# Patient Record
Sex: Female | Born: 1965
Health system: Southern US, Community
[De-identification: ages and names within clinical notes are randomized; demographics above are authoritative.]

## PROBLEM LIST (undated history)

## (undated) ENCOUNTER — Emergency Department (HOSPITAL_COMMUNITY): Admission: EM | Payer: Medicaid Other

## (undated) DIAGNOSIS — E119 Type 2 diabetes mellitus without complications: Secondary | ICD-10-CM

## (undated) DIAGNOSIS — M797 Fibromyalgia: Secondary | ICD-10-CM

## (undated) DIAGNOSIS — Z972 Presence of dental prosthetic device (complete) (partial): Secondary | ICD-10-CM

## (undated) DIAGNOSIS — R519 Headache, unspecified: Secondary | ICD-10-CM

## (undated) DIAGNOSIS — R200 Anesthesia of skin: Secondary | ICD-10-CM

## (undated) DIAGNOSIS — D649 Anemia, unspecified: Secondary | ICD-10-CM

## (undated) DIAGNOSIS — I509 Heart failure, unspecified: Secondary | ICD-10-CM

## (undated) DIAGNOSIS — K469 Unspecified abdominal hernia without obstruction or gangrene: Secondary | ICD-10-CM

## (undated) DIAGNOSIS — R51 Headache: Secondary | ICD-10-CM

## (undated) DIAGNOSIS — I10 Essential (primary) hypertension: Secondary | ICD-10-CM

## (undated) HISTORY — DX: Headache, unspecified: R51.9

## (undated) HISTORY — DX: Anemia, unspecified: D64.9

## (undated) HISTORY — DX: Essential (primary) hypertension: I10

## (undated) HISTORY — PX: TUBAL LIGATION: SHX77

## (undated) HISTORY — DX: Unspecified abdominal hernia without obstruction or gangrene: K46.9

## (undated) HISTORY — DX: Headache: R51

## (undated) HISTORY — PX: HERNIA REPAIR: SHX51

## (undated) HISTORY — DX: Anesthesia of skin: R20.0

---

## 1997-08-15 ENCOUNTER — Other Ambulatory Visit: Admission: RE | Admit: 1997-08-15 | Discharge: 1997-08-15 | Payer: Self-pay | Admitting: Family Medicine

## 1997-11-18 ENCOUNTER — Inpatient Hospital Stay (HOSPITAL_COMMUNITY): Admission: AD | Admit: 1997-11-18 | Discharge: 1997-11-18 | Payer: Self-pay | Admitting: Emergency Medicine

## 1997-11-20 ENCOUNTER — Inpatient Hospital Stay (HOSPITAL_COMMUNITY): Admission: AD | Admit: 1997-11-20 | Discharge: 1997-11-26 | Payer: Self-pay | Admitting: Obstetrics

## 1997-11-27 ENCOUNTER — Encounter: Admission: RE | Admit: 1997-11-27 | Discharge: 1998-02-25 | Payer: Self-pay | Admitting: Obstetrics

## 1998-01-05 ENCOUNTER — Inpatient Hospital Stay (HOSPITAL_COMMUNITY): Admission: AD | Admit: 1998-01-05 | Discharge: 1998-01-12 | Payer: Self-pay | Admitting: Obstetrics

## 1998-04-11 ENCOUNTER — Emergency Department (HOSPITAL_COMMUNITY): Admission: EM | Admit: 1998-04-11 | Discharge: 1998-04-11 | Payer: Self-pay | Admitting: Emergency Medicine

## 1998-04-12 ENCOUNTER — Encounter: Payer: Self-pay | Admitting: Family Medicine

## 1998-04-12 ENCOUNTER — Encounter: Payer: Self-pay | Admitting: *Deleted

## 1998-06-21 ENCOUNTER — Emergency Department (HOSPITAL_COMMUNITY): Admission: EM | Admit: 1998-06-21 | Discharge: 1998-06-21 | Payer: Self-pay | Admitting: Emergency Medicine

## 1998-08-12 ENCOUNTER — Emergency Department (HOSPITAL_COMMUNITY): Admission: EM | Admit: 1998-08-12 | Discharge: 1998-08-12 | Payer: Self-pay | Admitting: Emergency Medicine

## 2000-02-28 ENCOUNTER — Emergency Department (HOSPITAL_COMMUNITY): Admission: EM | Admit: 2000-02-28 | Discharge: 2000-02-28 | Payer: Self-pay | Admitting: Emergency Medicine

## 2000-11-04 ENCOUNTER — Inpatient Hospital Stay (HOSPITAL_COMMUNITY): Admission: EM | Admit: 2000-11-04 | Discharge: 2000-11-08 | Payer: Self-pay | Admitting: Emergency Medicine

## 2001-12-19 ENCOUNTER — Encounter: Payer: Self-pay | Admitting: Emergency Medicine

## 2001-12-19 ENCOUNTER — Emergency Department (HOSPITAL_COMMUNITY): Admission: EM | Admit: 2001-12-19 | Discharge: 2001-12-19 | Payer: Self-pay | Admitting: Emergency Medicine

## 2002-10-19 ENCOUNTER — Emergency Department (HOSPITAL_COMMUNITY): Admission: EM | Admit: 2002-10-19 | Discharge: 2002-10-19 | Payer: Self-pay | Admitting: Emergency Medicine

## 2002-10-19 ENCOUNTER — Encounter: Payer: Self-pay | Admitting: Emergency Medicine

## 2002-11-05 ENCOUNTER — Other Ambulatory Visit: Admission: RE | Admit: 2002-11-05 | Discharge: 2002-11-05 | Payer: Self-pay | Admitting: Anesthesiology

## 2002-11-05 ENCOUNTER — Other Ambulatory Visit: Admission: RE | Admit: 2002-11-05 | Discharge: 2002-11-05 | Payer: Self-pay | Admitting: Family Medicine

## 2003-06-17 ENCOUNTER — Emergency Department (HOSPITAL_COMMUNITY): Admission: EM | Admit: 2003-06-17 | Discharge: 2003-06-17 | Payer: Self-pay | Admitting: Emergency Medicine

## 2003-11-12 ENCOUNTER — Emergency Department (HOSPITAL_COMMUNITY): Admission: EM | Admit: 2003-11-12 | Discharge: 2003-11-12 | Payer: Self-pay | Admitting: *Deleted

## 2005-01-22 ENCOUNTER — Inpatient Hospital Stay (HOSPITAL_COMMUNITY): Admission: AD | Admit: 2005-01-22 | Discharge: 2005-01-22 | Payer: Self-pay | Admitting: Obstetrics & Gynecology

## 2005-05-08 ENCOUNTER — Emergency Department (HOSPITAL_COMMUNITY): Admission: EM | Admit: 2005-05-08 | Discharge: 2005-05-08 | Payer: Self-pay | Admitting: Emergency Medicine

## 2005-05-25 ENCOUNTER — Emergency Department (HOSPITAL_COMMUNITY): Admission: EM | Admit: 2005-05-25 | Discharge: 2005-05-26 | Payer: Self-pay | Admitting: Emergency Medicine

## 2005-07-10 ENCOUNTER — Emergency Department (HOSPITAL_COMMUNITY): Admission: EM | Admit: 2005-07-10 | Discharge: 2005-07-10 | Payer: Self-pay | Admitting: Emergency Medicine

## 2005-08-24 ENCOUNTER — Inpatient Hospital Stay (HOSPITAL_COMMUNITY): Admission: AD | Admit: 2005-08-24 | Discharge: 2005-08-24 | Payer: Self-pay | Admitting: Gynecology

## 2006-05-16 ENCOUNTER — Emergency Department (HOSPITAL_COMMUNITY): Admission: EM | Admit: 2006-05-16 | Discharge: 2006-05-16 | Payer: Self-pay | Admitting: Emergency Medicine

## 2006-10-29 ENCOUNTER — Emergency Department (HOSPITAL_COMMUNITY): Admission: EM | Admit: 2006-10-29 | Discharge: 2006-10-29 | Payer: Self-pay | Admitting: Emergency Medicine

## 2007-08-28 ENCOUNTER — Emergency Department (HOSPITAL_COMMUNITY): Admission: EM | Admit: 2007-08-28 | Discharge: 2007-08-28 | Payer: Self-pay | Admitting: Emergency Medicine

## 2007-09-13 ENCOUNTER — Ambulatory Visit: Payer: Self-pay | Admitting: Internal Medicine

## 2008-03-04 ENCOUNTER — Emergency Department (HOSPITAL_COMMUNITY): Admission: EM | Admit: 2008-03-04 | Discharge: 2008-03-04 | Payer: Self-pay | Admitting: Family Medicine

## 2008-05-04 ENCOUNTER — Inpatient Hospital Stay (HOSPITAL_COMMUNITY): Admission: AD | Admit: 2008-05-04 | Discharge: 2008-05-04 | Payer: Self-pay | Admitting: Family Medicine

## 2008-05-09 ENCOUNTER — Emergency Department (HOSPITAL_COMMUNITY): Admission: EM | Admit: 2008-05-09 | Discharge: 2008-05-09 | Payer: Self-pay | Admitting: Emergency Medicine

## 2008-09-07 ENCOUNTER — Emergency Department (HOSPITAL_COMMUNITY): Admission: EM | Admit: 2008-09-07 | Discharge: 2008-09-07 | Payer: Self-pay | Admitting: Emergency Medicine

## 2009-02-06 ENCOUNTER — Encounter (INDEPENDENT_AMBULATORY_CARE_PROVIDER_SITE_OTHER): Payer: Self-pay | Admitting: Adult Health

## 2009-02-06 ENCOUNTER — Ambulatory Visit: Payer: Self-pay | Admitting: Family Medicine

## 2009-02-06 LAB — CONVERTED CEMR LAB: Herpes Simplex Vrs I&II-IgM Ab (EIA): 0.66

## 2009-03-17 ENCOUNTER — Ambulatory Visit: Payer: Self-pay | Admitting: Internal Medicine

## 2009-03-20 ENCOUNTER — Ambulatory Visit: Payer: Self-pay | Admitting: Internal Medicine

## 2009-03-31 ENCOUNTER — Ambulatory Visit: Payer: Self-pay | Admitting: Family Medicine

## 2009-03-31 ENCOUNTER — Encounter (INDEPENDENT_AMBULATORY_CARE_PROVIDER_SITE_OTHER): Payer: Self-pay | Admitting: Adult Health

## 2009-03-31 ENCOUNTER — Other Ambulatory Visit: Admission: RE | Admit: 2009-03-31 | Discharge: 2009-03-31 | Payer: Self-pay | Admitting: Internal Medicine

## 2009-03-31 LAB — CONVERTED CEMR LAB
ALT: 28 units/L (ref 0–35)
Alkaline Phosphatase: 76 units/L (ref 39–117)
Calcium: 8.9 mg/dL (ref 8.4–10.5)
Creatinine, Ser: 0.62 mg/dL (ref 0.40–1.20)
Eosinophils Relative: 2 % (ref 0–5)
Hemoglobin: 12.8 g/dL (ref 12.0–15.0)
LDL Cholesterol: 147 mg/dL — ABNORMAL HIGH (ref 0–99)
Lymphocytes Relative: 37 % (ref 12–46)
MCHC: 31.1 g/dL (ref 30.0–36.0)
Potassium: 4.4 meq/L (ref 3.5–5.3)
Sodium: 142 meq/L (ref 135–145)
TSH: 1.576 microintl units/mL (ref 0.350–4.500)
Total Bilirubin: 0.4 mg/dL (ref 0.3–1.2)
Total CHOL/HDL Ratio: 6.8
VLDL: 46 mg/dL — ABNORMAL HIGH (ref 0–40)
Vit D, 25-Hydroxy: 20 ng/mL — ABNORMAL LOW (ref 30–89)
WBC: 5.3 10*3/uL (ref 4.0–10.5)

## 2009-04-08 ENCOUNTER — Ambulatory Visit (HOSPITAL_COMMUNITY): Admission: RE | Admit: 2009-04-08 | Discharge: 2009-04-08 | Payer: Self-pay | Admitting: Internal Medicine

## 2009-04-14 ENCOUNTER — Encounter: Admission: RE | Admit: 2009-04-14 | Discharge: 2009-04-14 | Payer: Self-pay | Admitting: Internal Medicine

## 2009-04-28 ENCOUNTER — Ambulatory Visit: Payer: Self-pay | Admitting: Family Medicine

## 2009-05-06 ENCOUNTER — Ambulatory Visit (HOSPITAL_COMMUNITY): Admission: RE | Admit: 2009-05-06 | Discharge: 2009-05-06 | Payer: Self-pay | Admitting: Family Medicine

## 2009-05-20 ENCOUNTER — Ambulatory Visit: Payer: Self-pay | Admitting: Internal Medicine

## 2009-07-30 ENCOUNTER — Emergency Department (HOSPITAL_COMMUNITY): Admission: EM | Admit: 2009-07-30 | Discharge: 2009-07-30 | Payer: Self-pay | Admitting: Emergency Medicine

## 2010-01-05 ENCOUNTER — Ambulatory Visit: Payer: Self-pay | Admitting: Internal Medicine

## 2010-01-25 ENCOUNTER — Emergency Department (HOSPITAL_COMMUNITY): Admission: EM | Admit: 2010-01-25 | Discharge: 2010-01-25 | Payer: Self-pay | Admitting: Emergency Medicine

## 2010-06-11 ENCOUNTER — Emergency Department (HOSPITAL_COMMUNITY)
Admission: EM | Admit: 2010-06-11 | Discharge: 2010-06-12 | Disposition: A | Discharge status: Home / Self Care | Payer: Medicaid Other | Attending: Emergency Medicine | Admitting: Emergency Medicine

## 2010-06-11 ENCOUNTER — Emergency Department (HOSPITAL_COMMUNITY): Payer: Medicaid Other

## 2010-06-11 DIAGNOSIS — M79609 Pain in unspecified limb: Secondary | ICD-10-CM | POA: Insufficient documentation

## 2010-06-11 DIAGNOSIS — W208XXA Other cause of strike by thrown, projected or falling object, initial encounter: Secondary | ICD-10-CM | POA: Insufficient documentation

## 2010-07-07 ENCOUNTER — Emergency Department (HOSPITAL_COMMUNITY)
Admission: EM | Admit: 2010-07-07 | Discharge: 2010-07-07 | Disposition: A | Discharge status: Home / Self Care | Payer: No Typology Code available for payment source | Attending: Emergency Medicine | Admitting: Emergency Medicine

## 2010-07-09 ENCOUNTER — Emergency Department (HOSPITAL_COMMUNITY)
Admission: EM | Admit: 2010-07-09 | Discharge: 2010-07-09 | Disposition: A | Discharge status: Home / Self Care | Payer: Medicaid Other | Attending: Emergency Medicine | Admitting: Emergency Medicine

## 2010-07-09 DIAGNOSIS — M545 Low back pain, unspecified: Secondary | ICD-10-CM | POA: Insufficient documentation

## 2010-07-09 DIAGNOSIS — S335XXA Sprain of ligaments of lumbar spine, initial encounter: Secondary | ICD-10-CM | POA: Insufficient documentation

## 2010-08-29 ENCOUNTER — Emergency Department (HOSPITAL_COMMUNITY)
Admission: EM | Admit: 2010-08-29 | Discharge: 2010-08-29 | Disposition: A | Discharge status: Home / Self Care | Payer: Self-pay | Attending: Emergency Medicine | Admitting: Emergency Medicine

## 2010-08-29 DIAGNOSIS — R599 Enlarged lymph nodes, unspecified: Secondary | ICD-10-CM | POA: Insufficient documentation

## 2010-08-29 DIAGNOSIS — M542 Cervicalgia: Secondary | ICD-10-CM | POA: Insufficient documentation

## 2010-09-17 NOTE — Discharge Summary (Signed)
[facility]  Patient:    Robin Pace, Robin Pace                       MRN: [id number] Adm. Date:  [id number] Disc. Date: 11/08/00 Attending:  Judie Petit                           Discharge Summary  ADMISSION DIAGNOSIS:  Urinary tract infection with possible pyelonephritis, and hypokalemia, Rule out pelvic inflammatory disease.  DISCHARGE DIAGNOSES: 1. Escherichia coli pyelonephritis. 2. Hypokalemia. 3. Trichomonas vaginitis. 4. Bacterial vaginosis. 5. Substance abuse.  CONDITION ON DISCHARGE:  Stable and improved.  DISCHARGE DISPOSITION:  The patient is being discharged today.  Will continue on Tequin 500 mg q.d. to complete 10 days, and use Darvocet-N 100 and ibuprofen for pain.  She will have follow-up visit in my office in approximately one week.  HISTORY OF PRESENT ILLNESS:  The patient presented with a three day history of fever, fatigue, and low back pain.  She was seen by the emergency department physician and referred to Dr. Jeanella Anton for admission.  Her history and physical exam are explained in the discharge summary ______. DD:  11/08/00 TD:  11/08/00 Job: [id number]
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
## 2010-09-17 NOTE — Discharge Summary (Signed)
[facility]  Patient:    Robin Pace, Robin Pace                       MRN: [id number] Adm. Date:  [id number] Disc. Date: [id number] Attending:  Judie Petit                           Discharge Summary  ADMITTING DIAGNOSIS:  Urinary tract infection with possible pyelonephritis and hypokalemia, rule out pelvic inflammatory disease.  DISCHARGE DIAGNOSES: 1. Escherichia coli pyelonephritis. 2. Hypokalemia. 3. Trichomonas vaginitis. 4. Bacterial vaginosis. 5. Substance abuse.  DISCHARGE CONDITION:  Stable and improved.  DISCHARGE DISPOSITION:  Patient will continue on Tequin 500 mg q.d. to complete 10 days and use Darvocet-N 100 mg and ibuprofen for pain.  She will have a follow-up visit in my office in approximately one week.  She is to renew her focus to avoid illicit drugs and drink plenty of liquids.  HISTORY OF PRESENT ILLNESS:  This patient is a 45 year old single black woman who presented with pelvic and abdominal pain.  She was initially evaluated in the emergency room and referred to the on-call physician, Dr. Leilani Able, for further evaluation.  Patient had gross hematuria but denied dysuria.  She reported one loose stool with one episode of emesis in the emergency room. Patient admitted sexual activity without use of condoms often and not on any oral contraceptives.  She had a tubal ligation with last pregnancy.  Past medical history, family history, personal history, review of systems outlined in the admission history and physical.  PHYSICAL EXAMINATION:  VITAL SIGNS:  Temperature 97.5, blood pressure 105/68, pulse 103, weight 129.6.  GENERAL:  Patient was lying in bed and appeared fairly comfortable.  HEENT:  PERRLA.  EOMs were intact.  NECK:  Supple with no jugular venous distention, bruits, nodes, or organomegaly.  CHEST:  Clear to auscultation.  HEART:  Regular rhythm and rate without murmur.  ABDOMEN:  Soft and flat.   There was no organomegaly.  Bowel sounds were normoactive.  EXTREMITIES:  No clubbing, cyanosis, edema, or deformity.  NEUROLOGIC:  Cranial nerves 2-12 intact and otherwise grossly normal.  LABORATORY DATA:  A CBC revealed white count 12.8 initially with hemoglobin 12.9 and hematocrit 39.0.  Platelets were 204,000 with 80 neutrophils, 8 lymphocytes, 12 monocytes.  Chemistry reveals sodium 131, potassium 3.1, and glucose 135.  Repeat revealed sodium 139, potassium 3.9, chloride 113, glucose 125.  Hepatitis panel was negative.  HIV was nonreactive.  Urine pregnancy was negative.  Urinalysis revealed cloudy appearance with moderate hemoglobin, 2% urobilinogen, ______ mg percent protein, 21 to 50 white blood cells, and 36 red blood cells.  There were many bacteria and granular casts, occasional Trichomonas.  Urine culture had greater than 100,000 colony count of E. coli sensitive to all antibiotics.  Wet prep had many white blood cells, many Trichomonas, and many clue cells.  Herpes simplex type 1 was negative with herpes simplex 2 IgG antibody 8.17 with normal being less than 0.9.  HOSPITAL COURSE:  Patient was admitted to the hospital and given IV fluid of normal saline with 20 mEq Kay Ciel at 150 cc per hour.  She was given 1 g of Zithromax p.o. and Cipro 500 mg one p.o.  Naprosyn was given for pain.  On July 7, her IV fluids were heparin locked and, by July 8, IV fluids were discontinued and she was placed on oral medicines  with Tequin 400 mg p.o. daily.  She was seen by social work for assistance in medications as indigent patient and discharge for outpatient management.  DISCHARGE DIAGNOSES: 1. Escherichia coli pyelonephritis. 2. Trichomonas vaginitis. 3. Bacterial vaginosis. 4. Hyperglycemia.  ADDENDUM TO DISCHARGE MEDICATIONS:  Flagyl 500 mg b.i.d. for five days. DD:  11/28/00 TD:  11/29/00 Job: [id number]

## 2010-09-30 ENCOUNTER — Inpatient Hospital Stay (INDEPENDENT_AMBULATORY_CARE_PROVIDER_SITE_OTHER)
Admission: RE | Admit: 2010-09-30 | Discharge: 2010-09-30 | Disposition: A | Discharge status: Home / Self Care | Payer: Self-pay | Source: Ambulatory Visit | Attending: Emergency Medicine | Admitting: Emergency Medicine

## 2010-09-30 DIAGNOSIS — A64 Unspecified sexually transmitted disease: Secondary | ICD-10-CM

## 2010-09-30 LAB — POCT URINALYSIS DIP (DEVICE)
Glucose, UA: NEGATIVE mg/dL
Ketones, ur: 15 mg/dL — AB
Nitrite: POSITIVE — AB
pH: 5 (ref 5.0–8.0)

## 2010-09-30 LAB — POCT PREGNANCY, URINE: Preg Test, Ur: NEGATIVE

## 2010-10-10 ENCOUNTER — Emergency Department (HOSPITAL_COMMUNITY): Payer: Self-pay

## 2010-10-10 ENCOUNTER — Emergency Department (HOSPITAL_COMMUNITY)
Admission: EM | Admit: 2010-10-10 | Discharge: 2010-10-10 | Disposition: A | Discharge status: Home / Self Care | Payer: Self-pay | Attending: Emergency Medicine | Admitting: Emergency Medicine

## 2010-10-10 DIAGNOSIS — R059 Cough, unspecified: Secondary | ICD-10-CM | POA: Insufficient documentation

## 2010-10-10 DIAGNOSIS — R05 Cough: Secondary | ICD-10-CM | POA: Insufficient documentation

## 2010-10-10 DIAGNOSIS — R0602 Shortness of breath: Secondary | ICD-10-CM | POA: Insufficient documentation

## 2010-10-10 DIAGNOSIS — R071 Chest pain on breathing: Secondary | ICD-10-CM | POA: Insufficient documentation

## 2010-10-28 ENCOUNTER — Other Ambulatory Visit (HOSPITAL_COMMUNITY): Payer: Self-pay | Admitting: Family Medicine

## 2010-10-28 DIAGNOSIS — R222 Localized swelling, mass and lump, trunk: Secondary | ICD-10-CM

## 2010-11-04 ENCOUNTER — Other Ambulatory Visit (HOSPITAL_COMMUNITY): Payer: Self-pay

## 2010-11-05 ENCOUNTER — Ambulatory Visit (HOSPITAL_COMMUNITY)
Admission: RE | Admit: 2010-11-05 | Discharge: 2010-11-05 | Disposition: A | Discharge status: Home / Self Care | Payer: Self-pay | Source: Ambulatory Visit | Attending: Family Medicine | Admitting: Family Medicine

## 2010-11-05 ENCOUNTER — Other Ambulatory Visit (HOSPITAL_COMMUNITY): Payer: Self-pay | Admitting: Family Medicine

## 2010-11-05 DIAGNOSIS — R229 Localized swelling, mass and lump, unspecified: Secondary | ICD-10-CM | POA: Insufficient documentation

## 2010-11-05 DIAGNOSIS — R079 Chest pain, unspecified: Secondary | ICD-10-CM | POA: Insufficient documentation

## 2010-11-05 DIAGNOSIS — R222 Localized swelling, mass and lump, trunk: Secondary | ICD-10-CM

## 2010-11-05 MED ORDER — IOHEXOL 300 MG/ML  SOLN: 100.0000 mL | Freq: Once | INTRAMUSCULAR | Status: AC | PRN

## 2010-12-11 ENCOUNTER — Emergency Department (HOSPITAL_COMMUNITY)
Admission: EM | Admit: 2010-12-11 | Discharge: 2010-12-11 | Disposition: A | Discharge status: Home / Self Care | Payer: Self-pay | Attending: Emergency Medicine | Admitting: Emergency Medicine

## 2010-12-11 DIAGNOSIS — R222 Localized swelling, mass and lump, trunk: Secondary | ICD-10-CM | POA: Insufficient documentation

## 2010-12-11 DIAGNOSIS — R51 Headache: Secondary | ICD-10-CM | POA: Insufficient documentation

## 2010-12-11 DIAGNOSIS — R110 Nausea: Secondary | ICD-10-CM | POA: Insufficient documentation

## 2011-02-24 ENCOUNTER — Inpatient Hospital Stay (INDEPENDENT_AMBULATORY_CARE_PROVIDER_SITE_OTHER)
Admission: RE | Admit: 2011-02-24 | Discharge: 2011-02-24 | Disposition: A | Discharge status: Home / Self Care | Payer: Self-pay | Source: Ambulatory Visit | Attending: Family Medicine | Admitting: Family Medicine

## 2011-02-24 DIAGNOSIS — H8309 Labyrinthitis, unspecified ear: Secondary | ICD-10-CM

## 2011-02-24 LAB — POCT I-STAT, CHEM 8
BUN: 14 mg/dL (ref 6–23)
Calcium, Ion: 1.21 mmol/L (ref 1.12–1.32)
Creatinine, Ser: 0.8 mg/dL (ref 0.50–1.10)
Glucose, Bld: 90 mg/dL (ref 70–99)
HCT: 39 % (ref 36.0–46.0)
Hemoglobin: 13.3 g/dL (ref 12.0–15.0)
TCO2: 23 mmol/L (ref 0–100)

## 2011-05-27 ENCOUNTER — Encounter (HOSPITAL_COMMUNITY): Payer: Self-pay | Admitting: *Deleted

## 2011-05-27 ENCOUNTER — Emergency Department (INDEPENDENT_AMBULATORY_CARE_PROVIDER_SITE_OTHER)
Admission: EM | Admit: 2011-05-27 | Discharge: 2011-05-27 | Disposition: A | Discharge status: Home / Self Care | Payer: Self-pay | Source: Home / Self Care | Attending: Family Medicine | Admitting: Family Medicine

## 2011-05-27 DIAGNOSIS — S139XXA Sprain of joints and ligaments of unspecified parts of neck, initial encounter: Secondary | ICD-10-CM

## 2011-05-27 DIAGNOSIS — S161XXA Strain of muscle, fascia and tendon at neck level, initial encounter: Secondary | ICD-10-CM

## 2011-05-27 DIAGNOSIS — A499 Bacterial infection, unspecified: Secondary | ICD-10-CM

## 2011-05-27 DIAGNOSIS — N760 Acute vaginitis: Secondary | ICD-10-CM

## 2011-05-27 DIAGNOSIS — D1739 Benign lipomatous neoplasm of skin and subcutaneous tissue of other sites: Secondary | ICD-10-CM

## 2011-05-27 DIAGNOSIS — D171 Benign lipomatous neoplasm of skin and subcutaneous tissue of trunk: Secondary | ICD-10-CM

## 2011-05-27 DIAGNOSIS — B9689 Other specified bacterial agents as the cause of diseases classified elsewhere: Secondary | ICD-10-CM

## 2011-05-27 LAB — WET PREP, GENITAL: Yeast Wet Prep HPF POC: NONE SEEN

## 2011-05-27 LAB — POCT URINALYSIS DIP (DEVICE)
Bilirubin Urine: NEGATIVE
Glucose, UA: NEGATIVE mg/dL
Nitrite: POSITIVE — AB
Urobilinogen, UA: 0.2 mg/dL (ref 0.0–1.0)

## 2011-05-27 MED ORDER — IBUPROFEN 800 MG PO TABS: 800.0000 mg | ORAL_TABLET | Freq: Three times a day (TID) | ORAL | Status: AC

## 2011-05-27 MED ORDER — CYCLOBENZAPRINE HCL 5 MG PO TABS: 5.0000 mg | ORAL_TABLET | Freq: Three times a day (TID) | ORAL | Status: AC | PRN

## 2011-05-27 MED ORDER — METRONIDAZOLE 0.75 % VA GEL: 1.0000 | VAGINAL | Status: AC

## 2011-05-27 NOTE — ED Notes (Signed)
Pt c/o white vaginal discharge onset a week ago.  Denies any itching, foul odor or burning.  States she has had some urinary frequency but no dysuria.  Also c/o right neck and shoulder pain onset yesterday. No known injury.  Limited rom to neck.  Also concerned about "lump" to right lateral chest/side.  Has been there for approx 6 mos, sore when she lies on right side and now painful when she lifts her arms.  Also noticed that she's getting a "lump" on left side.  Non tender to touch.

## 2011-05-27 NOTE — ED Notes (Signed)
Pt on the phone while I giving instructions,  Pulled it away from her ear for a moment, but then went back to her conversation.  Asked questions but continued to talk on phone.

## 2011-05-27 NOTE — ED Provider Notes (Signed)
History     CSN: [id number]  Arrival date & time 05/27/11  1030   First MD Initiated Contact with Patient 05/27/11 1051      Chief Complaint  Patient presents with  . Exposure to STD  . Mass    (Consider location/radiation/quality/duration/timing/severity/associated sxs/prior treatment) Patient is a 46 y.o. female presenting with STD exposure and rash. The history is provided by the patient.  Exposure to STD This is a new problem. The current episode started yesterday. The problem has not changed since onset. Rash  This is a new problem. Episode onset: mass right flank getting bigger and more painful. The problem has been gradually worsening. There has been no fever. The rash is present on the back. The pain is moderate.    History reviewed. No pertinent past medical history.  Past Surgical History  Procedure Date  . Tubal ligation   . Cesarean section     No family history on file.  History  Substance Use Topics  . Smoking status: Current Everyday Smoker  . Smokeless tobacco: Not on file  . Alcohol Use: Yes     opccassional    OB History    Grav Para Term Preterm Abortions TAB SAB Ect Mult Living                  Review of Systems  Constitutional: Negative.   HENT: Positive for neck pain.   Respiratory: Negative.   Gastrointestinal: Negative.   Genitourinary: Positive for flank pain and vaginal discharge. Negative for urgency and vaginal bleeding.  Skin: Positive for rash.    Allergies  Review of patient's allergies indicates no known allergies.  Home Medications   Current Outpatient Rx  Name Route Sig Dispense Refill  . CYCLOBENZAPRINE HCL 5 MG PO TABS Oral Take 1 tablet (5 mg total) by mouth 3 (three) times daily as needed for muscle spasms. 30 tablet 0  . IBUPROFEN 800 MG PO TABS Oral Take 1 tablet (800 mg total) by mouth 3 (three) times daily. 30 tablet 0  . METRONIDAZOLE 0.75 % VA GEL Vaginal Place 1 Applicatorful vaginally 1 day or 1 dose. At  bedtime for 5 nights 70 g 0    BP 136/89  Pulse 90  Temp(Src) 98.3 F (36.8 C) (Oral)  Resp 20  SpO2 100%  Physical Exam  Nursing note and vitals reviewed. Constitutional: She appears well-developed and well-nourished.  HENT:  Head: Normocephalic.  Neck: Trachea normal. Muscular tenderness present. No spinous process tenderness present. No rigidity. Decreased range of motion present. No erythema present. No Brudzinski's sign and no Kernig's sign noted. No mass and no thyromegaly present.    Abdominal: Soft. Bowel sounds are normal. There is no tenderness.  Genitourinary: Vagina normal. No vaginal discharge found.  Lymphadenopathy:    She has no cervical adenopathy.  Skin:       Soft lipoma to right flank area, no signs of infection    ED Course  Procedures (including critical care time)  Labs Reviewed  POCT URINALYSIS DIP (DEVICE) - Abnormal; Notable for the following:    Hgb urine dipstick SMALL (*)    Nitrite POSITIVE (*)    Leukocytes, UA TRACE (*) Biochemical Testing Only. Please order routine urinalysis from main lab if confirmatory testing is needed.   All other components within normal limits  POCT URINALYSIS DIPSTICK  GC/CHLAMYDIA PROBE AMP, GENITAL  WET PREP, GENITAL   No results found.   1. Lipoma of flank   2. Bacterial  vaginitis   3. Posterolateral cervical muscle strain       MDM          Barkley Bruns, MD 05/27/11 1150

## 2011-05-28 LAB — GC/CHLAMYDIA PROBE AMP, GENITAL
Chlamydia, DNA Probe: NEGATIVE
GC Probe Amp, Genital: NEGATIVE

## 2011-07-05 ENCOUNTER — Ambulatory Visit (INDEPENDENT_AMBULATORY_CARE_PROVIDER_SITE_OTHER): Payer: Medicaid Other | Admitting: General Surgery

## 2011-08-19 ENCOUNTER — Encounter (HOSPITAL_COMMUNITY): Payer: Self-pay

## 2011-08-19 ENCOUNTER — Emergency Department (INDEPENDENT_AMBULATORY_CARE_PROVIDER_SITE_OTHER)
Admission: EM | Admit: 2011-08-19 | Discharge: 2011-08-19 | Disposition: A | Discharge status: Home / Self Care | Payer: Self-pay | Source: Home / Self Care | Attending: Emergency Medicine | Admitting: Emergency Medicine

## 2011-08-19 DIAGNOSIS — R079 Chest pain, unspecified: Secondary | ICD-10-CM

## 2011-08-19 DIAGNOSIS — M79644 Pain in right finger(s): Secondary | ICD-10-CM

## 2011-08-19 DIAGNOSIS — M79609 Pain in unspecified limb: Secondary | ICD-10-CM

## 2011-08-19 DIAGNOSIS — D179 Benign lipomatous neoplasm, unspecified: Secondary | ICD-10-CM

## 2011-08-19 DIAGNOSIS — R0789 Other chest pain: Secondary | ICD-10-CM

## 2011-08-19 MED ORDER — IBUPROFEN 800 MG PO TABS: 800.0000 mg | ORAL_TABLET | Freq: Three times a day (TID) | ORAL | Status: AC

## 2011-08-19 MED ORDER — SULFAMETHOXAZOLE-TRIMETHOPRIM 800-160 MG PO TABS: 1.0000 | ORAL_TABLET | Freq: Two times a day (BID) | ORAL | Status: AC

## 2011-08-19 NOTE — ED Notes (Signed)
Stitting BP done at 11:03 and Standing at 11:04

## 2011-08-19 NOTE — ED Provider Notes (Signed)
Medical screening examination/treatment/procedure(s) were performed by non-physician practitioner and as supervising physician I was immediately available for consultation/collaboration.  Raynald Blend, MD 08/19/11 1201

## 2011-08-19 NOTE — ED Notes (Signed)
Has noted swollen lumps on chest, bilateral rib areas; c/o painful to touch, htere for couple of months ; had a manicure recently, and has developed pain, swelling, discoloration, numbness right middle finger

## 2011-08-19 NOTE — ED Provider Notes (Signed)
History     CSN: [id number]  Arrival date & time 08/19/11  1021   First MD Initiated Contact with Patient 08/19/11 1025      Chief Complaint  Patient presents with  . Hand Pain    (Consider location/radiation/quality/duration/timing/severity/associated sxs/prior treatment) HPI Comments: Patient presents today with several complaints. First is that her right middle finger, lateral to the nail has been tender and also reportedly feels numb since her manicure 3 days ago. She states the area looks slightly swollen. No redness or drainage. Second she complains of a lump on her right lateral chest for approximately 6 months. She states as recently has become tender. No change in size. She states she has had this previously evaluated and was told that it was a lipoma.. Patient states that she has an area in her mid chest it is tender to the touch. She states that she can feel a lump in this area also is concerned that it is related to a lipoma. She denies any injury, or recent heavy lifting. Is no redness or skin changes noted.     History reviewed. No pertinent past medical history.  Past Surgical History  Procedure Date  . Tubal ligation   . Cesarean section     No family history on file.  History  Substance Use Topics  . Smoking status: Current Everyday Smoker  . Smokeless tobacco: Not on file  . Alcohol Use: Yes     opccassional    OB History    Grav Para Term Preterm Abortions TAB SAB Ect Mult Living                  Review of Systems  Constitutional: Negative for fever and chills.  Respiratory: Negative for cough and shortness of breath.   Cardiovascular: Negative for chest pain.  Musculoskeletal: Negative for joint swelling.    Allergies  Review of patient's allergies indicates no known allergies.  Home Medications   Current Outpatient Rx  Name Route Sig Dispense Refill  . IBUPROFEN 800 MG PO TABS Oral Take 1 tablet (800 mg total) by mouth 3 (three) times  daily. 15 tablet 0  . SULFAMETHOXAZOLE-TRIMETHOPRIM 800-160 MG PO TABS Oral Take 1 tablet by mouth every 12 (twelve) hours. 14 tablet 0    BP 135/89  Pulse 84  Temp(Src) 98.1 F (36.7 C) (Oral)  Resp 12  SpO2 99%  LMP 07/20/2011  Physical Exam  Nursing note and vitals reviewed. Constitutional: She appears well-developed and well-nourished. No distress.  HENT:  Head: Normocephalic and atraumatic.  Cardiovascular: Normal rate, regular rhythm and normal heart sounds.   Pulmonary/Chest: Effort normal and breath sounds normal. No respiratory distress. She exhibits tenderness. She exhibits no deformity.    Musculoskeletal:       Right hand: Normal.       Hands: Skin: Skin is warm and dry.  Psychiatric: She has a normal mood and affect.    ED Course  Procedures (including critical care time)  Labs Reviewed - No data to display No results found.   1. Finger pain, right   2. Lipoma   3. Sternum pain       MDM  Rt middle finger tenderness since manicure - possible early paronychia. Will treat with warm water soaks and Bactrim. Persistent Rt lateral chest wall lipoma. Pt reports has recently become tender. To f/u with PCP.  Recent mid sternal tenderness to palpation only. No trauma. Treated with Ibuprofen. Also to f/u with PCP if  persists.           Melody Comas, [place] 08/19/11 1138

## 2011-08-19 NOTE — Discharge Instructions (Signed)
Soak you finger in warm water 2-3 times a day. Follow up with your doctor at [facility]. Return if symptoms change or worsen.

## 2011-08-26 ENCOUNTER — Encounter (HOSPITAL_COMMUNITY): Payer: Self-pay | Admitting: General Practice

## 2011-08-26 ENCOUNTER — Emergency Department (HOSPITAL_COMMUNITY)
Admission: EM | Admit: 2011-08-26 | Discharge: 2011-08-26 | Disposition: A | Discharge status: Home / Self Care | Payer: No Typology Code available for payment source | Attending: Emergency Medicine | Admitting: Emergency Medicine

## 2011-08-26 DIAGNOSIS — R071 Chest pain on breathing: Secondary | ICD-10-CM | POA: Insufficient documentation

## 2011-08-26 DIAGNOSIS — F17200 Nicotine dependence, unspecified, uncomplicated: Secondary | ICD-10-CM | POA: Insufficient documentation

## 2011-08-26 DIAGNOSIS — S20219A Contusion of unspecified front wall of thorax, initial encounter: Secondary | ICD-10-CM | POA: Insufficient documentation

## 2011-08-26 DIAGNOSIS — Y92410 Unspecified street and highway as the place of occurrence of the external cause: Secondary | ICD-10-CM | POA: Insufficient documentation

## 2011-08-26 MED ORDER — ACETAMINOPHEN-CODEINE #3 300-30 MG PO TABS: 1.0000 | ORAL_TABLET | Freq: Four times a day (QID) | ORAL | Status: AC | PRN

## 2011-08-26 MED ORDER — CYCLOBENZAPRINE HCL 5 MG PO TABS: 5.0000 mg | ORAL_TABLET | Freq: Three times a day (TID) | ORAL | Status: AC | PRN

## 2011-08-26 MED ORDER — IBUPROFEN 800 MG PO TABS: 800.0000 mg | ORAL_TABLET | Freq: Three times a day (TID) | ORAL | Status: AC

## 2011-08-26 MED ORDER — IBUPROFEN 800 MG PO TABS
800.0000 mg | ORAL_TABLET | Freq: Once | ORAL | Status: AC
  Administered 2011-08-26: 800 mg via ORAL
  Filled 2011-08-26: qty 1

## 2011-08-26 MED ORDER — ACETAMINOPHEN-CODEINE #3 300-30 MG PO TABS
1.0000 | ORAL_TABLET | Freq: Once | ORAL | Status: AC
  Administered 2011-08-26: 1 via ORAL
  Filled 2011-08-26: qty 1
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
## 2011-08-26 NOTE — ED Provider Notes (Signed)
Medical screening examination/treatment/procedure(s) were performed by non-physician practitioner and as supervising physician I was immediately available for consultation/collaboration.   Casaundra Takacs M Aaliyah Gavel, DO 08/26/11 2216 
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
## 2012-01-10 ENCOUNTER — Other Ambulatory Visit: Payer: Self-pay | Admitting: Emergency Medicine

## 2012-01-10 DIAGNOSIS — D249 Benign neoplasm of unspecified breast: Secondary | ICD-10-CM

## 2012-01-13 ENCOUNTER — Ambulatory Visit
Admission: RE | Admit: 2012-01-13 | Discharge: 2012-01-13 | Disposition: A | Discharge status: Home / Self Care | Payer: Medicaid Other | Source: Ambulatory Visit | Attending: Emergency Medicine | Admitting: Emergency Medicine

## 2012-01-13 DIAGNOSIS — D249 Benign neoplasm of unspecified breast: Secondary | ICD-10-CM

## 2012-02-02 ENCOUNTER — Emergency Department (HOSPITAL_COMMUNITY)
Admission: EM | Admit: 2012-02-02 | Discharge: 2012-02-03 | Discharge status: Against Medical Advice | Payer: Medicaid Other

## 2012-02-03 ENCOUNTER — Emergency Department (HOSPITAL_BASED_OUTPATIENT_CLINIC_OR_DEPARTMENT_OTHER)
Admission: EM | Admit: 2012-02-03 | Discharge: 2012-02-03 | Disposition: A | Discharge status: Home / Self Care | Payer: Medicaid Other | Attending: Emergency Medicine | Admitting: Emergency Medicine

## 2012-02-03 ENCOUNTER — Encounter (HOSPITAL_BASED_OUTPATIENT_CLINIC_OR_DEPARTMENT_OTHER): Payer: Self-pay | Admitting: *Deleted

## 2012-02-03 DIAGNOSIS — T148 Other injury of unspecified body region: Secondary | ICD-10-CM | POA: Insufficient documentation

## 2012-02-03 DIAGNOSIS — F17200 Nicotine dependence, unspecified, uncomplicated: Secondary | ICD-10-CM | POA: Insufficient documentation

## 2012-02-03 DIAGNOSIS — W57XXXA Bitten or stung by nonvenomous insect and other nonvenomous arthropods, initial encounter: Secondary | ICD-10-CM | POA: Insufficient documentation

## 2012-02-03 MED ORDER — HYDROCORTISONE 1 % EX CREA: TOPICAL_CREAM | CUTANEOUS | Status: DC

## 2012-02-03 NOTE — ED Provider Notes (Addendum)
History     CSN: [id number]  Arrival date & time 02/03/12  0007   First MD Initiated Contact with Patient 02/03/12 0030      Chief Complaint  Patient presents with  . Rash    (Consider location/radiation/quality/duration/timing/severity/associated sxs/prior treatment) Patient is a 46 y.o. female presenting with rash. The history is provided by the patient. No language interpreter was used.  Rash  This is a new problem. The current episode started yesterday. The problem has not changed since onset.The problem is associated with an insect bite/sting. There has been no fever. The rash is present on the right arm and left arm. She has tried antihistamines for the symptoms. The treatment provided no relief. Risk factors: insects.  Sitting in a chair at a motel and black bugs started crawling on her now she is raised lesions and itching not responsive to benadryl.    History reviewed. No pertinent past medical history.  Past Surgical History  Procedure Date  . Tubal ligation   . Cesarean section     No family history on file.  History  Substance Use Topics  . Smoking status: Current Every Day Smoker  . Smokeless tobacco: Not on file  . Alcohol Use: Yes     occassional    OB History    Grav Para Term Preterm Abortions TAB SAB Ect Mult Living                  Review of Systems  Constitutional: Negative for fever.  Skin: Positive for rash.  All other systems reviewed and are negative.    Allergies  Review of patient's allergies indicates no known allergies.  Home Medications   Current Outpatient Rx  Name Route Sig Dispense Refill  . HYDROCORTISONE 1 % EX CREA  Apply to affected area 2 times daily 15 g 0    BP 140/96  Pulse 86  Temp 98.6 F (37 C) (Oral)  Resp 16  Ht 5\' 8"  (1.727 m)  Wt 180 lb (81.647 kg)  BMI 27.37 kg/m2  SpO2 98%  LMP 02/03/2012  Physical Exam  Constitutional: She is oriented to person, place, and time. She appears well-developed and  well-nourished.  HENT:  Head: Normocephalic and atraumatic.  Eyes: EOM are normal.  Neck: Normal range of motion.  Cardiovascular: Normal rate and regular rhythm.   Pulmonary/Chest: Effort normal and breath sounds normal.  Musculoskeletal: Normal range of motion.  Neurological: She is alert and oriented to person, place, and time.  Skin: Skin is warm and dry.       Raised lesions with central area cw insect bites on volar and dorsal aspect of B forearms and lower back.      ED Course  Procedures (including critical care time)  Labs Reviewed - No data to display No results found.   1. Insect bites       MDM  Follow up with your family doctor for ongoing care.         Jasmine Awe, MD 02/03/12 0034  Anvita Hirata K Analise Glotfelty-Rasch, MD 02/03/12 [phone]

## 2012-02-03 NOTE — ED Notes (Signed)
Pt reports being at the [facility] yesterday. Reports seeing a black bug on her white scrubs, reports squeezing it and it had blood coming out. Then, reports seeing red bugs and little black bugs on her pants. Pt is concerned that she may have scabies or bed bugs. Pt has red welts on upper back/shoulder region, bilateral arms, lower back, and ankle region. Pt reports rash spreads to areas that she scratches. States that "it feels like bugs are crawling all over me". Pt reports taking two benadryl 25mg  pop approximately 3 hrs ago.

## 2012-04-17 ENCOUNTER — Encounter (INDEPENDENT_AMBULATORY_CARE_PROVIDER_SITE_OTHER): Payer: Self-pay | Admitting: General Surgery

## 2012-04-17 ENCOUNTER — Ambulatory Visit (INDEPENDENT_AMBULATORY_CARE_PROVIDER_SITE_OTHER): Payer: Medicaid Other | Admitting: General Surgery

## 2012-04-17 VITALS — BP 128/72 | HR 76 | Temp 97.9°F | Resp 16 | Ht 69.0 in | Wt 192.6 lb

## 2012-04-17 DIAGNOSIS — D179 Benign lipomatous neoplasm, unspecified: Secondary | ICD-10-CM

## 2012-04-17 NOTE — Progress Notes (Signed)
Patient ID: Robin Pace, female   DOB: 1965-11-06, 46 y.o.   MRN: [id number]  Chief Complaint  Patient presents with  . Lipoma    new pt- eval lipoma    HPI Robin Pace is a 46 y.o. female.  Referred by Dr Margretta Ditty HPI This is a 46 year old female smoker who presents with a long-standing history of bilateral flank masses. The right side is significantly bigger than the left. She states that these areas did not change significantly in the recent past. She does state that they cause her some discomfort and she is unable to lie on them at night. She comes in today and would like to have these areas excised. Past Medical History  Diagnosis Date  . Anemia     Past Surgical History  Procedure Date  . Tubal ligation   . Cesarean section     History reviewed. No pertinent family history.  Social History History  Substance Use Topics  . Smoking status: Current Every Day Smoker -- 0.5 packs/day  . Smokeless tobacco: Not on file  . Alcohol Use: Yes     Comment: occassional    No Known Allergies  No current outpatient prescriptions on file.    Review of Systems Review of Systems  Constitutional: Negative for fever, chills and unexpected weight change.  HENT: Negative for hearing loss, congestion, sore throat, trouble swallowing and voice change.   Eyes: Negative for visual disturbance.  Respiratory: Negative for cough and wheezing.   Cardiovascular: Negative for chest pain, palpitations and leg swelling.  Gastrointestinal: Negative for nausea, vomiting, abdominal pain, diarrhea, constipation, blood in stool, abdominal distention and anal bleeding.  Genitourinary: Negative for hematuria, vaginal bleeding and difficulty urinating.  Musculoskeletal: Negative for arthralgias.  Skin: Negative for rash and wound.  Neurological: Negative for seizures, syncope and headaches.  Hematological: Negative for adenopathy. Does not bruise/bleed easily.  Psychiatric/Behavioral:  Negative for confusion.    Blood pressure 128/72, pulse 76, temperature 97.9 F (36.6 C), temperature source Temporal, resp. rate 16, height 5\' 9"  (1.753 m), weight 192 lb 9.6 oz (87.363 kg).  Physical Exam Physical Exam  Vitals reviewed. Constitutional: She appears well-developed and well-nourished.  Cardiovascular: Normal rate, regular rhythm and normal heart sounds.   Pulmonary/Chest: Effort normal and breath sounds normal. She has no wheezes. She has no rales.    Lymphadenopathy:    She has no cervical adenopathy.    Data Reviewed Note from primary care physician  Assessment    Bilateral flank lipomas    Plan    These do appear clinically to be lipomas. One option would be just to monitor them for right now. She would very much like the 6 eyes because there are symptomatic to her. We discussed excision under general anesthesia. The left side should be fairly simple. The right side is a little bit larger. I discussed the risks including bleeding, infection, seroma, possibility of drain placement, wound separation. These are all increased due to her smoking history. We're planning on doing this in about a month.       Robin Pace 04/17/2012, 1:25 PM

## 2012-05-28 ENCOUNTER — Ambulatory Visit (HOSPITAL_BASED_OUTPATIENT_CLINIC_OR_DEPARTMENT_OTHER): Admit: 2012-05-28 | Payer: Medicaid Other | Admitting: Specialist

## 2012-05-28 ENCOUNTER — Encounter (HOSPITAL_BASED_OUTPATIENT_CLINIC_OR_DEPARTMENT_OTHER): Payer: Self-pay

## 2012-05-28 SURGERY — EXCISION MASS
Anesthesia: General | Site: Flank | Laterality: Left

## 2012-07-04 ENCOUNTER — Encounter (HOSPITAL_BASED_OUTPATIENT_CLINIC_OR_DEPARTMENT_OTHER): Payer: Self-pay | Admitting: *Deleted

## 2012-07-04 NOTE — Progress Notes (Signed)
Denies any heart or resp problems  

## 2012-07-09 ENCOUNTER — Encounter (HOSPITAL_BASED_OUTPATIENT_CLINIC_OR_DEPARTMENT_OTHER): Payer: Self-pay | Admitting: Anesthesiology

## 2012-07-09 ENCOUNTER — Ambulatory Visit (HOSPITAL_BASED_OUTPATIENT_CLINIC_OR_DEPARTMENT_OTHER): Admission: RE | Admit: 2012-07-09 | Payer: Medicaid Other | Source: Ambulatory Visit | Admitting: Specialist

## 2012-07-09 HISTORY — DX: Presence of dental prosthetic device (complete) (partial): Z97.2

## 2012-07-09 SURGERY — EXCISION MASS
Anesthesia: General | Site: Flank | Laterality: Left

## 2012-07-30 ENCOUNTER — Encounter (HOSPITAL_COMMUNITY): Payer: Self-pay | Admitting: Emergency Medicine

## 2012-07-30 ENCOUNTER — Emergency Department (HOSPITAL_COMMUNITY)
Admission: EM | Admit: 2012-07-30 | Discharge: 2012-07-30 | Discharge status: Against Medical Advice | Payer: Medicaid Other | Attending: Emergency Medicine | Admitting: Emergency Medicine

## 2012-07-30 DIAGNOSIS — R109 Unspecified abdominal pain: Secondary | ICD-10-CM | POA: Insufficient documentation

## 2012-07-30 DIAGNOSIS — R112 Nausea with vomiting, unspecified: Secondary | ICD-10-CM | POA: Insufficient documentation

## 2012-07-30 DIAGNOSIS — F17200 Nicotine dependence, unspecified, uncomplicated: Secondary | ICD-10-CM | POA: Insufficient documentation

## 2012-07-30 NOTE — ED Notes (Signed)
Patient states that for that few months, every month she has been having abdominal pain with N/V  - Reports that the Dr. Bluford Main that she may have a hernia

## 2012-07-31 ENCOUNTER — Emergency Department (HOSPITAL_COMMUNITY)
Admission: EM | Admit: 2012-07-31 | Discharge: 2012-07-31 | Disposition: A | Discharge status: Home / Self Care | Payer: Medicaid Other | Attending: Emergency Medicine | Admitting: Emergency Medicine

## 2012-07-31 ENCOUNTER — Encounter (HOSPITAL_COMMUNITY): Payer: Self-pay | Admitting: *Deleted

## 2012-07-31 ENCOUNTER — Emergency Department (HOSPITAL_COMMUNITY): Payer: Medicaid Other

## 2012-07-31 DIAGNOSIS — Z789 Other specified health status: Secondary | ICD-10-CM | POA: Insufficient documentation

## 2012-07-31 DIAGNOSIS — Z9119 Patient's noncompliance with other medical treatment and regimen: Secondary | ICD-10-CM | POA: Insufficient documentation

## 2012-07-31 DIAGNOSIS — Z91199 Patient's noncompliance with other medical treatment and regimen due to unspecified reason: Secondary | ICD-10-CM | POA: Insufficient documentation

## 2012-07-31 DIAGNOSIS — R197 Diarrhea, unspecified: Secondary | ICD-10-CM | POA: Insufficient documentation

## 2012-07-31 DIAGNOSIS — F17200 Nicotine dependence, unspecified, uncomplicated: Secondary | ICD-10-CM | POA: Insufficient documentation

## 2012-07-31 DIAGNOSIS — Z79899 Other long term (current) drug therapy: Secondary | ICD-10-CM | POA: Insufficient documentation

## 2012-07-31 DIAGNOSIS — R1110 Vomiting, unspecified: Secondary | ICD-10-CM | POA: Insufficient documentation

## 2012-07-31 DIAGNOSIS — Z862 Personal history of diseases of the blood and blood-forming organs and certain disorders involving the immune mechanism: Secondary | ICD-10-CM | POA: Insufficient documentation

## 2012-07-31 DIAGNOSIS — N390 Urinary tract infection, site not specified: Secondary | ICD-10-CM | POA: Insufficient documentation

## 2012-07-31 DIAGNOSIS — R1033 Periumbilical pain: Secondary | ICD-10-CM | POA: Insufficient documentation

## 2012-07-31 DIAGNOSIS — Z8719 Personal history of other diseases of the digestive system: Secondary | ICD-10-CM | POA: Insufficient documentation

## 2012-07-31 DIAGNOSIS — R109 Unspecified abdominal pain: Secondary | ICD-10-CM

## 2012-07-31 DIAGNOSIS — Z9851 Tubal ligation status: Secondary | ICD-10-CM | POA: Insufficient documentation

## 2012-07-31 LAB — CBC WITH DIFFERENTIAL/PLATELET
Eosinophils Absolute: 0.1 10*3/uL (ref 0.0–0.7)
Eosinophils Relative: 2 % (ref 0–5)
HCT: 37.1 % (ref 36.0–46.0)
Lymphocytes Relative: 37 % (ref 12–46)
Lymphs Abs: 1.9 10*3/uL (ref 0.7–4.0)
MCH: 27.3 pg (ref 26.0–34.0)
MCV: 85.1 fL (ref 78.0–100.0)
Monocytes Absolute: 0.4 10*3/uL (ref 0.1–1.0)
Monocytes Relative: 8 % (ref 3–12)
RBC: 4.36 MIL/uL (ref 3.87–5.11)
WBC: 5.1 10*3/uL (ref 4.0–10.5)

## 2012-07-31 LAB — URINALYSIS, ROUTINE W REFLEX MICROSCOPIC
Bilirubin Urine: NEGATIVE
Ketones, ur: NEGATIVE mg/dL

## 2012-07-31 LAB — COMPREHENSIVE METABOLIC PANEL
ALT: 14 U/L (ref 0–35)
BUN: 18 mg/dL (ref 6–23)
CO2: 23 mEq/L (ref 19–32)
Calcium: 9.1 mg/dL (ref 8.4–10.5)
Creatinine, Ser: 0.91 mg/dL (ref 0.50–1.10)
GFR calc Af Amer: 87 mL/min — ABNORMAL LOW (ref >90)
GFR calc non Af Amer: 75 mL/min — ABNORMAL LOW (ref >90)
Glucose, Bld: 113 mg/dL — ABNORMAL HIGH (ref 70–99)

## 2012-07-31 LAB — URINE MICROSCOPIC-ADD ON

## 2012-07-31 LAB — LIPASE, BLOOD: Lipase: 24 U/L (ref 11–59)

## 2012-07-31 MED ORDER — SULFAMETHOXAZOLE-TRIMETHOPRIM 800-160 MG PO TABS: 1.0000 | ORAL_TABLET | Freq: Two times a day (BID) | ORAL | Status: DC

## 2012-07-31 MED ORDER — TRAMADOL HCL 50 MG PO TABS: 50.0000 mg | ORAL_TABLET | Freq: Four times a day (QID) | ORAL | Status: DC | PRN

## 2012-07-31 MED ORDER — DOCUSATE SODIUM 100 MG PO CAPS: 100.0000 mg | ORAL_CAPSULE | Freq: Two times a day (BID) | ORAL | Status: DC

## 2012-07-31 NOTE — ED Provider Notes (Signed)
History     CSN: [id number]  Arrival date & time 07/31/12  1427   First MD Initiated Contact with Patient 07/31/12 1502      Chief Complaint  Patient presents with  . Abdominal Pain  . Diarrhea    (Consider location/radiation/quality/duration/timing/severity/associated sxs/prior treatment) HPI Pt with several weeks of abdominal pain which is periumbilical, worse with sitting up and straining. States she has been diagnosed with a hernia in the past by her PMD and has been referred to her Careers adviser. Pt has had one episode of vomiting but no obstipation or constipation. No fever or chills. States OTC pain meds not helping. No urinary or vaginal complaints.  Past Medical History  Diagnosis Date  . Anemia   . Medical history non-contributory   . Wears dentures     top    Past Surgical History  Procedure Laterality Date  . Tubal ligation    . Cesarean section    . Incise and drain abcess  2005    rt middle finger     History reviewed. No pertinent family history.  History  Substance Use Topics  . Smoking status: Current Every Day Smoker -- 0.50 packs/day  . Smokeless tobacco: Not on file  . Alcohol Use: Yes     Comment: occassional    OB History   Grav Para Term Preterm Abortions TAB SAB Ect Mult Living                  Review of Systems  Constitutional: Negative for fever and chills.  Respiratory: Negative for shortness of breath.   Cardiovascular: Negative for chest pain.  Gastrointestinal: Positive for vomiting and abdominal pain. Negative for nausea, diarrhea, constipation and abdominal distention.  Genitourinary: Negative for dysuria, flank pain, vaginal bleeding and vaginal discharge.  Musculoskeletal: Negative for back pain.  Skin: Negative for rash.  Neurological: Negative for weakness and numbness.  All other systems reviewed and are negative.    Allergies  Review of patient's allergies indicates no known allergies.  Home Medications   Current  Outpatient Rx  Name  Route  Sig  Dispense  Refill  . acetaminophen (TYLENOL) 500 MG tablet   Oral   Take 1,000 mg by mouth every 6 (six) hours as needed for pain.         . diphenhydramine-acetaminophen (TYLENOL PM) 25-500 MG TABS   Oral   Take 1-2 tablets by mouth at bedtime as needed.         Marland Kitchen acetaminophen-codeine (TYLENOL #3) 300-30 MG per tablet   Oral   Take 1 tablet by mouth every 4 (four) hours as needed for pain.         Marland Kitchen docusate sodium (COLACE) 100 MG capsule   Oral   Take 1 capsule (100 mg total) by mouth every 12 (twelve) hours.   60 capsule   0   . ibuprofen (ADVIL,MOTRIN) 200 MG tablet   Oral   Take 800 mg by mouth every 6 (six) hours as needed for pain.         Marland Kitchen sulfamethoxazole-trimethoprim (SEPTRA DS) 800-160 MG per tablet   Oral   Take 1 tablet by mouth every 12 (twelve) hours.   10 tablet   0   . traMADol (ULTRAM) 50 MG tablet   Oral   Take 1 tablet (50 mg total) by mouth every 6 (six) hours as needed for pain.   15 tablet   0     BP 119/75  Pulse  79  Temp(Src) 98.2 F (36.8 C) (Oral)  Resp 16  SpO2 96%  LMP 07/30/2012  Physical Exam  Nursing note and vitals reviewed. Constitutional: She is oriented to person, place, and time. She appears well-developed and well-nourished. No distress.  HENT:  Head: Normocephalic and atraumatic.  Mouth/Throat: Oropharynx is clear and moist.  Eyes: EOM are normal. Pupils are equal, round, and reactive to light.  Neck: Normal range of motion. Neck supple.  Cardiovascular: Normal rate and regular rhythm.   Pulmonary/Chest: Effort normal and breath sounds normal. No respiratory distress. She has no wheezes. She has no rales.  Abdominal: Soft. Bowel sounds are normal. She exhibits no distension and no mass. There is tenderness (focal tenderness to palp to the left periumbilical area. No appreciated mass. No rebound or guarding). There is no rebound and no guarding.  Musculoskeletal: Normal range of  motion. She exhibits no edema and no tenderness.  Neurological: She is alert and oriented to person, place, and time.  Skin: Skin is warm and dry. No rash noted. No erythema.  Psychiatric: She has a normal mood and affect. Her behavior is normal.    ED Course  Procedures (including critical care time)  Labs Reviewed  CBC WITH DIFFERENTIAL - Abnormal; Notable for the following:    Hemoglobin 11.9 (*)    All other components within normal limits  COMPREHENSIVE METABOLIC PANEL - Abnormal; Notable for the following:    Glucose, Bld 113 (*)    Albumin 3.4 (*)    Total Bilirubin 0.2 (*)    GFR calc non Af Amer 75 (*)    GFR calc Af Amer 87 (*)    All other components within normal limits  URINALYSIS, ROUTINE W REFLEX MICROSCOPIC - Abnormal; Notable for the following:    APPearance CLOUDY (*)    Specific Gravity, Urine 1.031 (*)    Hgb urine dipstick LARGE (*)    Leukocytes, UA MODERATE (*)    All other components within normal limits  URINE MICROSCOPIC-ADD ON - Abnormal; Notable for the following:    Squamous Epithelial / LPF FEW (*)    Bacteria, UA FEW (*)    All other components within normal limits  URINE CULTURE  LIPASE, BLOOD   Dg Abd Acute W/chest  07/31/2012  *RADIOLOGY REPORT*  Clinical Data: Abdominal pain, diarrhea, vomiting  ACUTE ABDOMEN SERIES (ABDOMEN 2 VIEW & CHEST 1 VIEW)  Comparison: 10/10/2010  Findings: The cardiomediastinal silhouette is stable.  No acute infiltrate or pleural effusion.  No pulmonary edema.  There is nonspecific nonobstructive bowel gas pattern.  No free abdominal air.  Stool noted throughout the colon.  IMPRESSION: No active disease.  No free abdominal air.  Nonspecific nonobstructive bowel gas pattern.  Colonic stool noted.   Original Report Authenticated By: Natasha Mead, M.D.      1. Abdominal pain   2. UTI (urinary tract infection)       MDM   Exam and workup with no concern for acute surgical emergency. Suspect abd pain may be due to  ventral hernia vs constipation. General surgery f/u given as well as return precautions. Pt is agreeable with plan.        Loren Racer, MD 07/31/12 [phone]

## 2012-07-31 NOTE — ED Notes (Signed)
Pt c/o abdominal pain since February that got much worse in the last 3 days. Pt sts was told by her PCP  that she has hernia. Sts was taking OTC pain medication without relief. Pt also reports diarrhea and one episode of emesis.

## 2012-08-01 ENCOUNTER — Encounter (HOSPITAL_BASED_OUTPATIENT_CLINIC_OR_DEPARTMENT_OTHER): Payer: Self-pay | Admitting: *Deleted

## 2012-08-01 LAB — URINE CULTURE: Culture: NO GROWTH

## 2012-08-01 NOTE — Progress Notes (Signed)
This surgery was r/s from 3/14-she was in ER 07/31/12 for uti=labs and ua done-tx qwith septra ds-told pt to let dr Aurelio Jew know

## 2012-08-06 ENCOUNTER — Encounter (HOSPITAL_BASED_OUTPATIENT_CLINIC_OR_DEPARTMENT_OTHER): Payer: Self-pay | Admitting: Anesthesiology

## 2012-08-06 ENCOUNTER — Ambulatory Visit (HOSPITAL_BASED_OUTPATIENT_CLINIC_OR_DEPARTMENT_OTHER)
Admission: RE | Admit: 2012-08-06 | Discharge: 2012-08-06 | Disposition: A | Discharge status: Home / Self Care | Payer: Medicaid Other | Source: Ambulatory Visit | Attending: Specialist | Admitting: Specialist

## 2012-08-06 ENCOUNTER — Encounter (HOSPITAL_BASED_OUTPATIENT_CLINIC_OR_DEPARTMENT_OTHER)
Admission: RE | Disposition: A | Discharge status: Home / Self Care | Payer: Self-pay | Source: Ambulatory Visit | Attending: Specialist

## 2012-08-06 ENCOUNTER — Ambulatory Visit (HOSPITAL_BASED_OUTPATIENT_CLINIC_OR_DEPARTMENT_OTHER): Payer: Medicaid Other | Admitting: Anesthesiology

## 2012-08-06 DIAGNOSIS — D649 Anemia, unspecified: Secondary | ICD-10-CM | POA: Insufficient documentation

## 2012-08-06 DIAGNOSIS — D1739 Benign lipomatous neoplasm of skin and subcutaneous tissue of other sites: Secondary | ICD-10-CM | POA: Insufficient documentation

## 2012-08-06 DIAGNOSIS — Z79899 Other long term (current) drug therapy: Secondary | ICD-10-CM | POA: Insufficient documentation

## 2012-08-06 DIAGNOSIS — F17200 Nicotine dependence, unspecified, uncomplicated: Secondary | ICD-10-CM | POA: Insufficient documentation

## 2012-08-06 HISTORY — PX: MASS EXCISION: SHX2000

## 2012-08-06 SURGERY — EXCISION MASS
Anesthesia: General | Site: Flank | Laterality: Right | Wound class: Clean

## 2012-08-06 MED ORDER — FENTANYL CITRATE 0.05 MG/ML IJ SOLN
INTRAMUSCULAR | Status: DC | PRN
  Administered 2012-08-06: 100 ug via INTRAVENOUS

## 2012-08-06 MED ORDER — MIDAZOLAM HCL 5 MG/5ML IJ SOLN
INTRAMUSCULAR | Status: DC | PRN
  Administered 2012-08-06: 2 mg via INTRAVENOUS

## 2012-08-06 MED ORDER — LACTATED RINGERS IV SOLN
INTRAVENOUS | Status: DC
  Administered 2012-08-06: 07:00:00 via INTRAVENOUS

## 2012-08-06 MED ORDER — CEFAZOLIN SODIUM-DEXTROSE 2-3 GM-% IV SOLR
2.0000 g | INTRAVENOUS | Status: AC
  Administered 2012-08-06: 2 g via INTRAVENOUS

## 2012-08-06 MED ORDER — FENTANYL CITRATE 0.05 MG/ML IJ SOLN: 50.0000 ug | INTRAMUSCULAR | Status: DC | PRN

## 2012-08-06 MED ORDER — LIDOCAINE HCL (CARDIAC) 20 MG/ML IV SOLN
INTRAVENOUS | Status: DC | PRN
  Administered 2012-08-06: 80 mg via INTRAVENOUS

## 2012-08-06 MED ORDER — MIDAZOLAM HCL 2 MG/2ML IJ SOLN: 1.0000 mg | INTRAMUSCULAR | Status: DC | PRN

## 2012-08-06 MED ORDER — SODIUM CHLORIDE 0.9 % IV SOLN
INTRAVENOUS | Status: DC | PRN
  Administered 2012-08-06: 200 mL via INTRAMUSCULAR

## 2012-08-06 MED ORDER — DEXAMETHASONE SODIUM PHOSPHATE 4 MG/ML IJ SOLN
INTRAMUSCULAR | Status: DC | PRN
  Administered 2012-08-06: 10 mg via INTRAVENOUS

## 2012-08-06 MED ORDER — OXYCODONE HCL 5 MG/5ML PO SOLN: 5.0000 mg | Freq: Once | ORAL | Status: AC | PRN

## 2012-08-06 MED ORDER — HYDROMORPHONE HCL PF 1 MG/ML IJ SOLN
0.5000 mg | Freq: Once | INTRAMUSCULAR | Status: AC
  Administered 2012-08-06: 0.5 mg via INTRAVENOUS

## 2012-08-06 MED ORDER — LIDOCAINE-EPINEPHRINE 0.5 %-1:200000 IJ SOLN
INTRAMUSCULAR | Status: DC | PRN
  Administered 2012-08-06: 100 mL

## 2012-08-06 MED ORDER — PROPOFOL 10 MG/ML IV BOLUS
INTRAVENOUS | Status: DC | PRN
  Administered 2012-08-06: 150 mg via INTRAVENOUS

## 2012-08-06 MED ORDER — OXYCODONE HCL 5 MG PO TABS
5.0000 mg | ORAL_TABLET | Freq: Once | ORAL | Status: AC | PRN
  Administered 2012-08-06: 5 mg via ORAL

## 2012-08-06 MED ORDER — HYDROMORPHONE HCL PF 1 MG/ML IJ SOLN
0.2500 mg | INTRAMUSCULAR | Status: DC | PRN
  Administered 2012-08-06 (×4): 0.5 mg via INTRAVENOUS

## 2012-08-06 SURGICAL SUPPLY — 57 items
BAG DECANTER FOR FLEXI CONT (MISCELLANEOUS) ×2 IMPLANT
BANDAGE ELASTIC 4 VELCRO ST LF (GAUZE/BANDAGES/DRESSINGS) IMPLANT
BANDAGE GAUZE ELAST BULKY 4 IN (GAUZE/BANDAGES/DRESSINGS) IMPLANT
BENZOIN TINCTURE PRP APPL 2/3 (GAUZE/BANDAGES/DRESSINGS) IMPLANT
BLADE KNIFE PERSONA 10 (BLADE) ×2 IMPLANT
BLADE KNIFE PERSONA 15 (BLADE) ×2 IMPLANT
BNDG COHESIVE 4X5 TAN STRL (GAUZE/BANDAGES/DRESSINGS) IMPLANT
CANISTER SUCTION 1200CC (MISCELLANEOUS) IMPLANT
CLEANER CAUTERY TIP 5X5 PAD (MISCELLANEOUS) ×1 IMPLANT
CLOTH BEACON ORANGE TIMEOUT ST (SAFETY) ×2 IMPLANT
COTTONBALL LRG STERILE PKG (GAUZE/BANDAGES/DRESSINGS) IMPLANT
COVER MAYO STAND STRL (DRAPES) ×2 IMPLANT
COVER TABLE BACK 60X90 (DRAPES) ×2 IMPLANT
DRAPE LAPAROSCOPIC ABDOMINAL (DRAPES) ×2 IMPLANT
DRAPE U-SHAPE 76X120 STRL (DRAPES) IMPLANT
DRSG PAD ABDOMINAL 8X10 ST (GAUZE/BANDAGES/DRESSINGS) IMPLANT
ELECT NEEDLE TIP 2.8 STRL (NEEDLE) ×2 IMPLANT
ELECT REM PT RETURN 9FT ADLT (ELECTROSURGICAL) ×2
ELECTRODE REM PT RTRN 9FT ADLT (ELECTROSURGICAL) ×1 IMPLANT
FILTER 7/8 IN (FILTER) IMPLANT
GAUZE SPONGE 4X4 12PLY STRL LF (GAUZE/BANDAGES/DRESSINGS) IMPLANT
GAUZE SPONGE 4X4 16PLY XRAY LF (GAUZE/BANDAGES/DRESSINGS) IMPLANT
GAUZE XEROFORM 1X8 LF (GAUZE/BANDAGES/DRESSINGS) ×2 IMPLANT
GAUZE XEROFORM 5X9 LF (GAUZE/BANDAGES/DRESSINGS) IMPLANT
GLOVE BIO SURGEON STRL SZ 6.5 (GLOVE) ×2 IMPLANT
GLOVE BIOGEL M STRL SZ7.5 (GLOVE) ×2 IMPLANT
GLOVE BIOGEL PI IND STRL 8 (GLOVE) ×1 IMPLANT
GLOVE BIOGEL PI INDICATOR 8 (GLOVE) ×1
GLOVE ECLIPSE 7.0 STRL STRAW (GLOVE) ×2 IMPLANT
GOWN PREVENTION PLUS XLARGE (GOWN DISPOSABLE) IMPLANT
GOWN PREVENTION PLUS XXLARGE (GOWN DISPOSABLE) ×4 IMPLANT
NEEDLE HYPO 25X1 1.5 SAFETY (NEEDLE) ×2 IMPLANT
PACK BASIN DAY SURGERY FS (CUSTOM PROCEDURE TRAY) ×2 IMPLANT
PAD CLEANER CAUTERY TIP 5X5 (MISCELLANEOUS) ×1
PENCIL BUTTON HOLSTER BLD 10FT (ELECTRODE) ×2 IMPLANT
SET ASPIRATION TUBING (TUBING) ×2 IMPLANT
SHEET MEDIUM DRAPE 40X70 STRL (DRAPES) ×2 IMPLANT
SHEETING SILICONE GEL EPI DERM (MISCELLANEOUS) IMPLANT
SLEEVE SCD COMPRESS KNEE MED (MISCELLANEOUS) ×2 IMPLANT
SPONGE GAUZE 4X4 12PLY (GAUZE/BANDAGES/DRESSINGS) IMPLANT
SPONGE LAP 18X18 X RAY DECT (DISPOSABLE) ×2 IMPLANT
STAPLER VISISTAT 35W (STAPLE) IMPLANT
STOCKINETTE 4X48 STRL (DRAPES) IMPLANT
STRIP CLOSURE SKIN 1/2X4 (GAUZE/BANDAGES/DRESSINGS) ×2 IMPLANT
SUCTION FRAZIER TIP 10 FR DISP (SUCTIONS) IMPLANT
SUT MNCRL AB 3-0 PS2 18 (SUTURE) IMPLANT
SUT PROLENE 4 0 P 3 18 (SUTURE) IMPLANT
SUT PROLENE 4 0 PS 2 18 (SUTURE) IMPLANT
SUT SILK 3 0 PS 1 (SUTURE) IMPLANT
SYR CONTROL 10ML LL (SYRINGE) ×2 IMPLANT
TAPE HYPAFIX 6X30 (GAUZE/BANDAGES/DRESSINGS) IMPLANT
TOWEL OR 17X24 6PK STRL BLUE (TOWEL DISPOSABLE) ×4 IMPLANT
TRAY DSU PREP LF (CUSTOM PROCEDURE TRAY) ×2 IMPLANT
TUBE CONNECTING 20X1/4 (TUBING) ×2 IMPLANT
UNDERPAD 30X30 INCONTINENT (UNDERPADS AND DIAPERS) IMPLANT
VAC PENCILS W/TUBING CLEAR (MISCELLANEOUS) IMPLANT
YANKAUER SUCT BULB TIP NO VENT (SUCTIONS) ×2 IMPLANT

## 2012-08-06 NOTE — Brief Op Note (Signed)
08/06/2012  8:24 AM  PATIENT:  Robin Pace  47 y.o. female  PRE-OPERATIVE DIAGNOSIS:  LARGE MASS LEFT FLANK  POST-OPERATIVE DIAGNOSIS:  LARGE MASS LEFT FLANK  PROCEDURE:  Procedure(s): EXCISION OF LARGE MASS right FLANK WITH LIPO ASSISTANCE (Right)  SURGEON:  Surgeon(s) and Role:    * Louisa Second, MD - Primary  PHYSICIAN ASSISTANT:   ASSISTANTS: none   ANESTHESIA:   general  EBL:     BLOOD ADMINISTERED:none  DRAINS: none   LOCAL MEDICATIONS USED:  LIDOCAINE   SPECIMEN:  Excision  DISPOSITION OF SPECIMEN:  PATHOLOGY  COUNTS:  YES  TOURNIQUET:  * No tourniquets in log *  DICTATION: .Other Dictation: Dictation Number [id number]  PLAN OF CARE: Discharge to home after PACU  PATIENT DISPOSITION:  PACU - hemodynamically stable.   Delay start of Pharmacological VTE agent (>24hrs) due to surgical blood loss or risk of bleeding: yes

## 2012-08-06 NOTE — H&P (Signed)
Robin Pace is an 47 y.o. female.   Chief Complaint: Enlarging mass flank HPI: Increased mass flank with pain and discomfort  Past Medical History  Diagnosis Date  . Anemia   . Medical history non-contributory   . Wears dentures     top    Past Surgical History  Procedure Laterality Date  . Tubal ligation    . Cesarean section    . Incise and drain abcess  2005    rt middle finger     History reviewed. No pertinent family history. Social History:  reports that she has been smoking.  She does not have any smokeless tobacco history on file. She reports that  drinks alcohol. She reports that she does not use illicit drugs.  Allergies: No Known Allergies  Medications Prior to Admission  Medication Sig Dispense Refill  . acetaminophen (TYLENOL) 500 MG tablet Take 1,000 mg by mouth every 6 (six) hours as needed for pain.      . diphenhydramine-acetaminophen (TYLENOL PM) 25-500 MG TABS Take 1-2 tablets by mouth at bedtime as needed.      . docusate sodium (COLACE) 100 MG capsule Take 1 capsule (100 mg total) by mouth every 12 (twelve) hours.  60 capsule  0  . ibuprofen (ADVIL,MOTRIN) 200 MG tablet Take 800 mg by mouth every 6 (six) hours as needed for pain.      Marland Kitchen sulfamethoxazole-trimethoprim (SEPTRA DS) 800-160 MG per tablet Take 1 tablet by mouth every 12 (twelve) hours.  10 tablet  0  . traMADol (ULTRAM) 50 MG tablet Take 1 tablet (50 mg total) by mouth every 6 (six) hours as needed for pain.  15 tablet  0    Results for orders placed during the hospital encounter of 08/06/12 (from the past 48 hour(s))  POCT HEMOGLOBIN-HEMACUE     Status: None   Collection Time    08/06/12  6:51 AM      Result Value Range   Hemoglobin 12.7  12.0 - 15.0 g/dL   No results found.  Review of Systems  Constitutional: Negative.   HENT: Negative.   Eyes: Negative.   Respiratory: Negative.   Cardiovascular: Negative.   Gastrointestinal: Negative.   Genitourinary: Negative.    Musculoskeletal: Negative.   Skin: Negative.   Neurological: Negative.   Endo/Heme/Allergies: Negative.   Psychiatric/Behavioral: Negative.     Blood pressure 154/100, pulse 72, temperature 97.6 F (36.4 C), temperature source Oral, resp. rate 18, height 5\' 9"  (1.753 m), weight 86.274 kg (190 lb 3.2 oz), last menstrual period 07/31/2012, SpO2 99.00%. Physical Exam   Assessment/Plan Large mass flank with increased growth foe excision and liposuction assistance  Zeeshan Korte L 08/06/2012, 7:30 AM

## 2012-08-06 NOTE — Progress Notes (Signed)
Dr. Sampson Goon notified of patients pain level and orders received and given.

## 2012-08-06 NOTE — Anesthesia Preprocedure Evaluation (Signed)
Anesthesia Evaluation  Patient identified by MRN, date of birth, ID band Patient awake    Reviewed: Allergy & Precautions, H&P , NPO status , Patient's Chart, lab work & pertinent test results  Airway Mallampati: II TM Distance: >3 FB Neck ROM: Full    Dental no notable dental hx. (+) Edentulous Upper and Dental Advisory Given   Pulmonary Current Smoker,  breath sounds clear to auscultation  Pulmonary exam normal       Cardiovascular negative cardio ROS  Rhythm:Regular Rate:Normal     Neuro/Psych negative neurological ROS  negative psych ROS   GI/Hepatic negative GI ROS, Neg liver ROS,   Endo/Other  negative endocrine ROS  Renal/GU negative Renal ROS  negative genitourinary   Musculoskeletal   Abdominal   Peds  Hematology  (+) anemia ,   Anesthesia Other Findings   Reproductive/Obstetrics negative OB ROS                           Anesthesia Physical Anesthesia Plan  ASA: II  Anesthesia Plan: General   Post-op Pain Management:    Induction: Intravenous  Airway Management Planned: Oral ETT  Additional Equipment:   Intra-op Plan:   Post-operative Plan: Extubation in OR  Informed Consent: I have reviewed the patients History and Physical, chart, labs and discussed the procedure including the risks, benefits and alternatives for the proposed anesthesia with the patient or authorized representative who has indicated his/her understanding and acceptance.   Dental advisory given  Plan Discussed with: CRNA  Anesthesia Plan Comments:         Anesthesia Quick Evaluation

## 2012-08-06 NOTE — Anesthesia Postprocedure Evaluation (Signed)
  Anesthesia Post-op Note  Patient: Robin Pace  Procedure(s) Performed: Procedure(s): EXCISION OF LARGE MASS right FLANK WITH LIPO ASSISTANCE (Right)  Patient Location: PACU  Anesthesia Type:General  Level of Consciousness: awake and alert   Airway and Oxygen Therapy: Patient Spontanous Breathing  Post-op Pain: mild  Post-op Assessment: Post-op Vital signs reviewed, Patient's Cardiovascular Status Stable, Respiratory Function Stable, Patent Airway and No signs of Nausea or vomiting  Post-op Vital Signs: Reviewed and stable  Complications: No apparent anesthesia complications

## 2012-08-06 NOTE — Op Note (Signed)
NAMEDIANEY, SUCHY              ACCOUNT NO.:  [id number]  MEDICAL RECORD NO.:  [id number]  LOCATION:                               FACILITY:  [facility]  PHYSICIAN:  Earvin Hansen L. Shon Hough, M.D.DATE OF BIRTH:  02/07/1966  DATE OF PROCEDURE:  08/06/2012 DATE OF DISCHARGE:  08/06/2012                              OPERATIVE REPORT   INDICATION:  A 47 year old lady who has large inflating masses right side greater than left.  Right side measured over 8-10 inches in length and approximately 8 inches in width, increased pain and discomfort, which she moves, flexes, and it has grown.  PROCEDURES DONE:  Exploration and removal of large mass and right flank plastic closure.  ANESTHESIA:  General.  Liposuction assistance.  DESCRIPTION OF PROCEDURE:  Preoperatively, the patient was sat up and drawn for the large mass with a marking pen.  She then underwent general anesthesia intubated orally, placed on the left lateral decubitus position.  Prep was done to the right side.  Abdomen and chest with Hibiclens soap and solution walled off with a sterile towels and drapes so as to make a sterile field.  The wound was injected with approximately 300 mL of local anesthesia, incisions were made. 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  The edges of the wound, and the dissection was then smoothed out using liposuction assistance to give a flattened surface. Flaps were then rotated and stayed with deep sutures of 2-0 Monocryl x2 levels a running subcuticular stitch of 3-0 Monocryl.  Large bulky dressings were applied including 4x4s Xeroform, ABDs, Hypafix tape.  She withstood the procedures very well.  ESTIMATED BLOOD LOSS:  150 mL.  COMPLICATIONS:  None.     Yaakov Guthrie. Shon Hough, M.D.     Cathie Hoops  D:  08/06/2012  T:  08/06/2012  Job:  [id number]

## 2012-08-06 NOTE — Transfer of Care (Signed)
Immediate Anesthesia Transfer of Care Note  Patient: Robin Pace  Procedure(s) Performed: Procedure(s): EXCISION OF LARGE MASS right FLANK WITH LIPO ASSISTANCE (Right)  Patient Location: PACU  Anesthesia Type:General  Level of Consciousness: awake and alert   Airway & Oxygen Therapy: Patient Spontanous Breathing and Patient connected to face mask oxygen  Post-op Assessment: Report given to PACU RN and Post -op Vital signs reviewed and stable  Post vital signs: Reviewed and stable  Complications: No apparent anesthesia complications

## 2012-08-06 NOTE — Anesthesia Procedure Notes (Signed)
Procedure Name: LMA Insertion Performed by: Massa Pe W Pre-anesthesia Checklist: Patient identified, Timeout performed, Emergency Drugs available, Suction available and Patient being monitored Patient Re-evaluated:Patient Re-evaluated prior to inductionOxygen Delivery Method: Circle system utilized Preoxygenation: Pre-oxygenation with 100% oxygen Intubation Type: IV induction Ventilation: Mask ventilation without difficulty LMA: LMA inserted LMA Size: 4.0 Number of attempts: 1 Placement Confirmation: breath sounds checked- equal and bilateral and positive ETCO2 Tube secured with: Tape Dental Injury: Teeth and Oropharynx as per pre-operative assessment      

## 2012-08-07 ENCOUNTER — Encounter (HOSPITAL_BASED_OUTPATIENT_CLINIC_OR_DEPARTMENT_OTHER): Payer: Self-pay | Admitting: Specialist

## 2012-09-07 ENCOUNTER — Encounter (INDEPENDENT_AMBULATORY_CARE_PROVIDER_SITE_OTHER): Payer: Medicaid Other | Admitting: General Surgery

## 2012-09-07 ENCOUNTER — Ambulatory Visit (INDEPENDENT_AMBULATORY_CARE_PROVIDER_SITE_OTHER): Payer: Medicaid Other | Admitting: General Surgery

## 2012-09-07 ENCOUNTER — Encounter (INDEPENDENT_AMBULATORY_CARE_PROVIDER_SITE_OTHER): Payer: Self-pay | Admitting: General Surgery

## 2012-09-07 VITALS — BP 132/80 | HR 86 | Temp 98.2°F | Resp 18 | Ht 69.0 in | Wt 193.0 lb

## 2012-09-07 DIAGNOSIS — R1013 Epigastric pain: Secondary | ICD-10-CM

## 2012-09-07 NOTE — Patient Instructions (Addendum)
Call Monday regarding results of your lab work and ultrasound. If your pain gets worse over the weekend or if you have any associated symptoms such as fever or vomiting you will need to go immediately to the emergency room.

## 2012-09-07 NOTE — Progress Notes (Signed)
Subjective:   abdominal pain  Patient ID: Robin Pace, female   DOB: 13-Apr-1966, 47 y.o.   MRN: [id number]  HPI Patient is a 47 year old female referred from [facility]. The patient was seen there with abdominal pain and examined by the physician assistant and felt to have a possible umbilical hernia. She describes anterior abdominal pain which has been coming and going for about 2 months. She will have days were bothers her just a little bit and she's had 3 episodes where it has been fairly severe and lasted all day. She had onset of the more severe pain began earlier today. She describes a cramping or twisting pain above her umbilicus in the midline. It is not related to eating and there are no associated GI symptoms such as nausea vomiting or diarrhea. She cannot pinpoint anything that brings the pain on such as physical activity or eating but lying down and resting seems to help when she gets it. She has not noted any lump or bulge at the umbilicus. She has no chronic GI history and no previous abdominal or GI surgery. No fever chills. She states she was treated with antibiotics for a urinary infection some weeks ago.  Past Medical History  Diagnosis Date  . Anemia   . Medical history non-contributory   . Wears dentures     top   Past Surgical History  Procedure Laterality Date  . Tubal ligation    . Cesarean section    . Incise and drain abcess  2005    rt middle finger   . Mass excision Right 08/06/2012    Procedure: EXCISION OF LARGE MASS right FLANK WITH LIPO ASSISTANCE;  Surgeon: Louisa Second, MD;  Location: [facility];  Service: Plastics;  Laterality: Right;   Current Outpatient Prescriptions  Medication Sig Dispense Refill  . docusate sodium (COLACE) 100 MG capsule Take 100 mg by mouth as needed.      . traMADol (ULTRAM) 50 MG tablet Take 1 tablet (50 mg total) by mouth every 6 (six) hours as needed for pain.  15 tablet  0   No current  facility-administered medications for this visit.   No Known Allergies   Review of Systems  Constitutional: Negative for fever and chills.  Respiratory: Negative.   Cardiovascular: Negative.   Gastrointestinal: Positive for abdominal pain. Negative for nausea, vomiting, diarrhea and constipation.  Genitourinary: Negative.        Objective:   Physical Exam BP 132/80  Pulse 86  Temp(Src) 98.2 F (36.8 C)  Resp 18  Ht 5\' 9"  (1.753 m)  Wt 193 lb (87.544 kg)  BMI 28.49 kg/m2 General: Well-developed. American female who looks very uncomfortable Skin: Warm and dry without rash or infection  HEENT: No icterus. No masses. No thyromegaly. Lungs: Clear equal breath sounds without increased work of breathing Cardiovascular: Regular rate and rhythm. No murmurs. No edema Abdomen: There is moderate tenderness in the low epigastrium extending up higher and somewhat in the right upper quadrant but not of the left upper quadrant. No guarding or peritoneal signs. No masses or fullness or organomegaly. The patient coughing and straining standing and with Valsalva maneuver I cannot feel any evidence of umbilical or anterior abdominal wall hernia. Extremities: No joint swelling or deformity or edema Neurologic: Alert and fully oriented. Affect normal.  Flat and upright abdominal x-ray ordered from the urgent medical care on April 1 was negative    Assessment:     Recurrent significant  epigastric abdominal pain. I do not find any evidence of a hernia. She does have some epigastric and right upper quadrant tenderness that might suggest gallbladder disease although no associated GI symptoms or any exacerbation with eating. We're going to get her lab work including complete blood count, urinalysis and chemistries and an ultrasound of the abdomen. Today is Friday and is not be scheduled until Monday. I told her she develops worsening pain or any associated symptoms such as vomiting or fever she needs to  present immediately to the emergency room. I gave her a small prescription of Vicodin the last over the weekend. We will be in touch with her Monday regarding ultrasound and lab work.    Plan:     As above

## 2012-09-10 ENCOUNTER — Telehealth (INDEPENDENT_AMBULATORY_CARE_PROVIDER_SITE_OTHER): Payer: Self-pay | Admitting: General Surgery

## 2012-09-10 NOTE — Telephone Encounter (Signed)
Left message for patient to call for [place] appt at [address] [facility] 09/12/12 at 9:15am

## 2012-09-12 ENCOUNTER — Other Ambulatory Visit: Payer: Medicaid Other

## 2012-09-18 ENCOUNTER — Ambulatory Visit (INDEPENDENT_AMBULATORY_CARE_PROVIDER_SITE_OTHER): Payer: Medicaid Other | Admitting: General Surgery

## 2012-10-26 ENCOUNTER — Emergency Department (HOSPITAL_COMMUNITY)
Admission: EM | Admit: 2012-10-26 | Discharge: 2012-10-26 | Disposition: A | Discharge status: Home / Self Care | Payer: Medicaid Other | Attending: Emergency Medicine | Admitting: Emergency Medicine

## 2012-10-26 ENCOUNTER — Encounter (HOSPITAL_COMMUNITY): Payer: Self-pay

## 2012-10-26 DIAGNOSIS — IMO0002 Reserved for concepts with insufficient information to code with codable children: Secondary | ICD-10-CM | POA: Insufficient documentation

## 2012-10-26 DIAGNOSIS — Z79899 Other long term (current) drug therapy: Secondary | ICD-10-CM | POA: Insufficient documentation

## 2012-10-26 DIAGNOSIS — R42 Dizziness and giddiness: Secondary | ICD-10-CM | POA: Insufficient documentation

## 2012-10-26 DIAGNOSIS — Z862 Personal history of diseases of the blood and blood-forming organs and certain disorders involving the immune mechanism: Secondary | ICD-10-CM | POA: Insufficient documentation

## 2012-10-26 DIAGNOSIS — F17200 Nicotine dependence, unspecified, uncomplicated: Secondary | ICD-10-CM | POA: Insufficient documentation

## 2012-10-26 DIAGNOSIS — M5416 Radiculopathy, lumbar region: Secondary | ICD-10-CM

## 2012-10-26 DIAGNOSIS — R1032 Left lower quadrant pain: Secondary | ICD-10-CM | POA: Insufficient documentation

## 2012-10-26 DIAGNOSIS — M79609 Pain in unspecified limb: Secondary | ICD-10-CM | POA: Insufficient documentation

## 2012-10-26 MED ORDER — HYDROMORPHONE HCL PF 1 MG/ML IJ SOLN
1.0000 mg | Freq: Once | INTRAMUSCULAR | Status: AC
  Administered 2012-10-26: 1 mg via INTRAMUSCULAR
  Filled 2012-10-26 (×2): qty 1

## 2012-10-26 MED ORDER — METHYLPREDNISOLONE 4 MG PO KIT: PACK | ORAL | Status: DC

## 2012-10-26 MED ORDER — ONDANSETRON 4 MG PO TBDP
4.0000 mg | ORAL_TABLET | Freq: Once | ORAL | Status: AC
  Administered 2012-10-26: 4 mg via ORAL
  Filled 2012-10-26: qty 1

## 2012-10-26 MED ORDER — OXYCODONE-ACETAMINOPHEN 5-325 MG PO TABS: 1.0000 | ORAL_TABLET | Freq: Four times a day (QID) | ORAL | Status: DC | PRN

## 2012-10-26 MED ORDER — HYDROMORPHONE HCL PF 2 MG/ML IJ SOLN
2.0000 mg | Freq: Once | INTRAMUSCULAR | Status: AC
  Administered 2012-10-26: 2 mg via INTRAMUSCULAR
  Filled 2012-10-26: qty 1

## 2012-10-26 MED ORDER — KETOROLAC TROMETHAMINE 60 MG/2ML IM SOLN
60.0000 mg | Freq: Once | INTRAMUSCULAR | Status: AC
  Administered 2012-10-26: 60 mg via INTRAMUSCULAR
  Filled 2012-10-26 (×2): qty 2

## 2012-10-26 NOTE — ED Notes (Signed)
Pt comfortable with d/c and f/u instructions. Prescriptions x2. 

## 2012-10-26 NOTE — ED Notes (Signed)
Pt presents with 3 day h/o cyst to R lower back.  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  Pt is unable to bear weight.

## 2012-10-26 NOTE — ED Provider Notes (Signed)
History    CSN: [id number] Arrival date & time 10/26/12  1416  First MD Initiated Contact with Patient 10/26/12 1716     No chief complaint on file.  (Consider location/radiation/quality/duration/timing/severity/associated sxs/prior Treatment) HPI Comments: Pt also c/o of pain over the surgical site which is unchanged since surgery.  Pain in the buttocks and down the leg is new.    Patient is a 47 y.o. female presenting with back pain. The history is provided by the patient.  Back Pain Location:  Lumbar spine Quality:  Aching, stabbing and shooting Radiates to:  L knee Pain severity:  Severe Pain is:  Same all the time Onset quality:  Sudden Duration:  3 days Timing:  Constant Progression:  Worsening Chronicity:  New Context: not falling, not lifting heavy objects, not MCA, not MVA and not physical stress   Relieved by:  Nothing Worsened by:  Movement and twisting Ineffective treatments:  Narcotics and muscle relaxants Associated symptoms: leg pain   Associated symptoms: no abdominal pain, no bladder incontinence, no bowel incontinence, no chest pain, no dysuria, no fever, no numbness, no paresthesias, no pelvic pain, no perianal numbness and no weakness   Risk factors: no hx of cancer, no hx of osteoporosis and no recent surgery   Risk factors comment:  States in march had lipoma removed laproscopically  Past Medical History  Diagnosis Date  . Anemia   . Medical history non-contributory   . Wears dentures     top   Past Surgical History  Procedure Laterality Date  . Tubal ligation    . Cesarean section    . Incise and drain abcess  2005    rt middle finger   . Mass excision Right 08/06/2012    Procedure: EXCISION OF LARGE MASS right FLANK WITH LIPO ASSISTANCE;  Surgeon: Louisa Second, MD;  Location: [facility];  Service: Plastics;  Laterality: Right;   Family History  Problem Relation Age of Onset  . Hypertension Mother   . Heart disease Father     . Heart disease Brother    History  Substance Use Topics  . Smoking status: Current Every Day Smoker -- 0.50 packs/day  . Smokeless tobacco: Not on file  . Alcohol Use: Yes     Comment: occassional   OB History   Grav Para Term Preterm Abortions TAB SAB Ect Mult Living                 Review of Systems  Constitutional: Negative for fever.  Cardiovascular: Negative for chest pain.  Gastrointestinal: Negative for nausea, vomiting, abdominal pain, constipation and bowel incontinence.  Genitourinary: Negative for bladder incontinence, dysuria and pelvic pain.  Musculoskeletal: Positive for back pain.  Neurological: Positive for dizziness. Negative for weakness, numbness and paresthesias.       Pt states she gets dizzy a lot sometimes daily which is sensation like on a merry-go round.  Take antivert for it but this is not a new sx.  All other systems reviewed and are negative.    Allergies  Review of patient's allergies indicates no known allergies.  Home Medications   Current Outpatient Rx  Name  Route  Sig  Dispense  Refill  . docusate sodium (COLACE) 100 MG capsule   Oral   Take 100 mg by mouth as needed.         . meclizine (ANTIVERT) 25 MG tablet   Oral   Take 25 mg by mouth 3 (three) times daily  as needed for dizziness.         . methocarbamol (ROBAXIN) 500 MG tablet   Oral   Take 500 mg by mouth 4 (four) times daily as needed (muscle pain).         . traMADol (ULTRAM) 50 MG tablet   Oral   Take 1 tablet (50 mg total) by mouth every 6 (six) hours as needed for pain.   15 tablet   0    BP 140/92  Pulse 96  Temp(Src) 98.1 F (36.7 C) (Oral)  Resp 20  SpO2 96%  LMP 10/26/2012 Physical Exam  Nursing note and vitals reviewed. Constitutional: She is oriented to person, place, and time. She appears well-developed and well-nourished. No distress.  HENT:  Head: Normocephalic and atraumatic.  Mouth/Throat: Oropharynx is clear and moist.  Eyes:  Conjunctivae and EOM are normal. Pupils are equal, round, and reactive to light.  Neck: Normal range of motion. Neck supple.  Cardiovascular: Normal rate, regular rhythm and intact distal pulses.   No murmur heard. Pulmonary/Chest: Effort normal and breath sounds normal. No respiratory distress. She has no wheezes. She has no rales.  Abdominal: Soft. She exhibits no distension. There is tenderness in the left lower quadrant. There is no rebound and no guarding.  Mild tenderness in LLQ no guarding or rebound  Musculoskeletal: Normal range of motion. She exhibits no edema and no tenderness.       Lumbar back: She exhibits tenderness, pain and spasm. She exhibits no bony tenderness, no deformity and normal pulse.       Back:  Neurological: She is alert and oriented to person, place, and time. She has normal strength. No sensory deficit.  Normal strength, sensation, color and pulse in the right lower ext  Skin: Skin is warm and dry. No rash noted. No erythema.  Psychiatric: She has a normal mood and affect. Her behavior is normal.    ED Course  Procedures (including critical care time) Labs Reviewed - No data to display No results found. No diagnosis found.  MDM   Pt with gradual onset of back pain suggestive of radiculopathy 3 days ago.  No neurovascular compromise and no incontinence.  Low suspicion for AAA or dissection.  No hx concerning for kidney stone. Pt has no infectious sx, hx of CA  or other red flags concerning for pathologic back pain.  Pt is able to ambulate but is painful.  Normal strength and reflexes on exam.  Denies trauma. Will give pt pain control and to return for developement of above sx.  Pt also c/o of pain over incision site of lipoma removal months ago with palpable what seems like hematoma.  However do not feel these things are related.  Pt states robaxin and hydrocodone are not helping with pain.  Will give percocet and give f/u and strict return  precautions.   Gwyneth Sprout, MD 10/26/12 1806

## 2012-11-29 ENCOUNTER — Encounter (HOSPITAL_COMMUNITY): Payer: Self-pay | Admitting: Adult Health

## 2012-11-29 ENCOUNTER — Emergency Department (HOSPITAL_COMMUNITY)
Admission: EM | Admit: 2012-11-29 | Discharge: 2012-11-29 | Disposition: A | Discharge status: Home / Self Care | Payer: Self-pay | Attending: Emergency Medicine | Admitting: Emergency Medicine

## 2012-11-29 ENCOUNTER — Emergency Department (HOSPITAL_COMMUNITY): Payer: Self-pay

## 2012-11-29 DIAGNOSIS — R0789 Other chest pain: Secondary | ICD-10-CM | POA: Insufficient documentation

## 2012-11-29 DIAGNOSIS — R222 Localized swelling, mass and lump, trunk: Secondary | ICD-10-CM | POA: Insufficient documentation

## 2012-11-29 DIAGNOSIS — Z862 Personal history of diseases of the blood and blood-forming organs and certain disorders involving the immune mechanism: Secondary | ICD-10-CM | POA: Insufficient documentation

## 2012-11-29 DIAGNOSIS — F17200 Nicotine dependence, unspecified, uncomplicated: Secondary | ICD-10-CM | POA: Insufficient documentation

## 2012-11-29 DIAGNOSIS — R079 Chest pain, unspecified: Secondary | ICD-10-CM

## 2012-11-29 LAB — BASIC METABOLIC PANEL
BUN: 14 mg/dL (ref 6–23)
Calcium: 10 mg/dL (ref 8.4–10.5)
Creatinine, Ser: 0.84 mg/dL (ref 0.50–1.10)
GFR calc Af Amer: >90 mL/min (ref >90)
GFR calc non Af Amer: 82 mL/min — ABNORMAL LOW (ref >90)
Glucose, Bld: 91 mg/dL (ref 70–99)

## 2012-11-29 LAB — CBC
HCT: 41.8 % (ref 36.0–46.0)
MCH: 27.3 pg (ref 26.0–34.0)
MCHC: 32.1 g/dL (ref 30.0–36.0)
MCV: 85.3 fL (ref 78.0–100.0)
RDW: 14 % (ref 11.5–15.5)

## 2012-11-29 MED ORDER — OXYCODONE-ACETAMINOPHEN 5-325 MG PO TABS: 1.0000 | ORAL_TABLET | ORAL | Status: DC | PRN

## 2012-11-29 NOTE — ED Provider Notes (Signed)
CSN: [id number]     Arrival date & time 11/29/12  1501 History     First MD Initiated Contact with Patient 11/29/12 1737     Chief Complaint  Patient presents with  . Chest Pain   (Consider location/radiation/quality/duration/timing/severity/associated sxs/prior Treatment) HPI Comments: Robin Pace is a 47 y.o. Female who presents for evaluation of chest pain, and this or not on the right flank. She has ongoing chest pain, bilateral anterior beneath each breast, since her surgery in April 2014. She had liposuction to remove a fatty tumor from her right flank. This was performed as an outpatient. Her intermittent chest pain improves spontaneously. There is no associated nausea, vomiting, diaphoresis, shortness of breath, weakness, or dizziness. She denies fever, and chills. There's been no dysuria or urinary frequency. There is no abdominal pain, or constipation. There no other known modifying factors  The history is provided by the patient.    Past Medical History  Diagnosis Date  . Anemia   . Medical history non-contributory   . Wears dentures     top   Past Surgical History  Procedure Laterality Date  . Tubal ligation    . Cesarean section    . Incise and drain abcess  2005    rt middle finger   . Mass excision Right 08/06/2012    Procedure: EXCISION OF LARGE MASS right FLANK WITH LIPO ASSISTANCE;  Surgeon: Louisa Second, MD;  Location: [facility];  Service: Plastics;  Laterality: Right;   Family History  Problem Relation Age of Onset  . Hypertension Mother   . Heart disease Father   . Heart disease Brother    History  Substance Use Topics  . Smoking status: Current Every Day Smoker -- 0.50 packs/day  . Smokeless tobacco: Not on file  . Alcohol Use: Yes     Comment: occassional   OB History   Grav Para Term Preterm Abortions TAB SAB Ect Mult Living                 Review of Systems  All other systems reviewed and are negative.    Allergies   Review of patient's allergies indicates no known allergies.  Home Medications   Current Outpatient Rx  Name  Route  Sig  Dispense  Refill  . HYDROcodone-acetaminophen (NORCO/VICODIN) 5-325 MG per tablet   Oral   Take 1-2 tablets by mouth every 6 (six) hours as needed for pain. For pain         . methylPREDNISolone (MEDROL DOSEPAK) 4 MG tablet   Oral   Take 4 mg by mouth daily as needed. For pain/inflammation         . oxyCODONE-acetaminophen (PERCOCET/ROXICET) 5-325 MG per tablet   Oral   Take 1-2 tablets by mouth every 6 (six) hours as needed for pain.   15 tablet   0   . traMADol (ULTRAM) 50 MG tablet   Oral   Take 1 tablet (50 mg total) by mouth every 6 (six) hours as needed for pain.   15 tablet   0    BP 150/89  Pulse 96  Temp(Src) 97.9 F (36.6 C)  Resp 16  SpO2 98% Physical Exam  Nursing note and vitals reviewed. Constitutional: She is oriented to person, place, and time. She appears well-developed and well-nourished.  HENT:  Head: Normocephalic and atraumatic.  Eyes: Conjunctivae and EOM are normal. Pupils are equal, round, and reactive to light.  Neck: Normal range of motion and  phonation normal. Neck supple.  Cardiovascular: Normal rate, regular rhythm and intact distal pulses.   Pulmonary/Chest: Effort normal and breath sounds normal. No respiratory distress. She has no wheezes. She has no rales. She exhibits no tenderness.  Right posterior chest wall with a 2 x 4 cm, tender nodule, approximately rib #11 right, location. No overlying skin abnormality. There are healed scars of the right chest wall, consistent with a liposuction procedure, this year; these are well-healed.  Abdominal: Soft. She exhibits no distension. There is no tenderness. There is no guarding.  Musculoskeletal: Normal range of motion.  Neurological: She is alert and oriented to person, place, and time. She has normal strength. She exhibits normal muscle tone.  Skin: Skin is warm and  dry.  Psychiatric: She has a normal mood and affect. Her behavior is normal. Judgment and thought content normal.    ED Course   Procedures (including critical care time)     Date: 11/29/12  Rate: 81  Rhythm: normal sinus rhythm  QRS Axis: normal  PR and QT Intervals: normal  ST/T Wave abnormalities: nonspecific T wave changes  PR and QRS Conduction Disutrbances:none  Narrative Interpretation:   Old EKG Reviewed: unchanged- 10/10/10   Labs Reviewed  BASIC METABOLIC PANEL - Abnormal; Notable for the following:    GFR calc non Af Amer 82 (*)    All other components within normal limits  CBC  POCT I-STAT TROPONIN I   Dg Chest 2 View  11/29/2012   *RADIOLOGY REPORT*  Clinical Data: Chest pain  CHEST - 2 VIEW  Comparison: July 31, 2012  Findings:  Lungs clear.  Heart size and pulmonary vascularity normal.  No adenopathy.  No bone lesions.  No pneumothorax.  IMPRESSION: No abnormality noted.   Original Report Authenticated By: Bretta Bang, M.D.   1. Mass of chest wall   2. Chest pain     MDM  Nonspecific chest pain, doubt ACS, PE, or pneumonia. She's very low risk for cardiac disease. Nodule right chest wall, unspecified nature. I doubt that this represents an infection, a cancer, or a direct surgical complication. The patient will likely need a diagnostic excision of the mass, to confirm what it is, and help her discomfort. Doubt metabolic instability, serious bacterial infection or impending vascular collapse; the patient is stable for discharge.   Nursing Notes Reviewed/ Care Coordinated, and agree without changes. Applicable Imaging Reviewed.  Interpretation of Laboratory Data incorporated into ED treatment    Plan: Home Medications- Percocet; Home Treatments and Observation- heat to sore area, watch for progressive symptoms; return here if the recommended treatment, does not improve the symptoms; Recommended follow up- PCP or surgery. Followup, as needed    Flint Melter, MD 11/29/12 [phone]

## 2012-11-29 NOTE — ED Notes (Addendum)
Presents with intermittent sharp right sided chest pain that is severe at times associated with abdominal pain and dizziness when lying down and intermittent SOB. Chest pain has been ongoing since [facility]. Pt denies chest pain at this time. C/o knot on right side and pain from a surgery in March.

## 2013-06-04 ENCOUNTER — Encounter (HOSPITAL_BASED_OUTPATIENT_CLINIC_OR_DEPARTMENT_OTHER): Payer: Self-pay | Admitting: *Deleted

## 2013-06-10 ENCOUNTER — Ambulatory Visit (HOSPITAL_BASED_OUTPATIENT_CLINIC_OR_DEPARTMENT_OTHER): Admission: RE | Admit: 2013-06-10 | Payer: Medicaid Other | Source: Ambulatory Visit | Admitting: Specialist

## 2013-06-10 SURGERY — ABDOMINOPLASTY
Anesthesia: General

## 2013-08-06 ENCOUNTER — Encounter (HOSPITAL_BASED_OUTPATIENT_CLINIC_OR_DEPARTMENT_OTHER): Payer: Self-pay | Admitting: *Deleted

## 2013-08-06 NOTE — Progress Notes (Signed)
Was r/s from 2/15-she was sick-better

## 2013-08-12 ENCOUNTER — Ambulatory Visit (HOSPITAL_BASED_OUTPATIENT_CLINIC_OR_DEPARTMENT_OTHER): Payer: Medicaid Other | Admitting: Anesthesiology

## 2013-08-12 ENCOUNTER — Encounter (HOSPITAL_BASED_OUTPATIENT_CLINIC_OR_DEPARTMENT_OTHER)
Admission: RE | Disposition: A | Discharge status: Home / Self Care | Payer: Self-pay | Source: Ambulatory Visit | Attending: Specialist

## 2013-08-12 ENCOUNTER — Encounter (HOSPITAL_BASED_OUTPATIENT_CLINIC_OR_DEPARTMENT_OTHER): Payer: Self-pay | Admitting: *Deleted

## 2013-08-12 ENCOUNTER — Encounter (HOSPITAL_BASED_OUTPATIENT_CLINIC_OR_DEPARTMENT_OTHER): Payer: Medicaid Other | Admitting: Anesthesiology

## 2013-08-12 ENCOUNTER — Ambulatory Visit (HOSPITAL_BASED_OUTPATIENT_CLINIC_OR_DEPARTMENT_OTHER)
Admission: RE | Admit: 2013-08-12 | Discharge: 2013-08-12 | Disposition: A | Discharge status: Home / Self Care | Payer: Medicaid Other | Source: Ambulatory Visit | Attending: Specialist | Admitting: Specialist

## 2013-08-12 DIAGNOSIS — D1779 Benign lipomatous neoplasm of other sites: Secondary | ICD-10-CM | POA: Insufficient documentation

## 2013-08-12 DIAGNOSIS — M6200 Separation of muscle (nontraumatic), unspecified site: Secondary | ICD-10-CM | POA: Insufficient documentation

## 2013-08-12 DIAGNOSIS — K439 Ventral hernia without obstruction or gangrene: Secondary | ICD-10-CM | POA: Insufficient documentation

## 2013-08-12 DIAGNOSIS — D649 Anemia, unspecified: Secondary | ICD-10-CM | POA: Insufficient documentation

## 2013-08-12 DIAGNOSIS — F17200 Nicotine dependence, unspecified, uncomplicated: Secondary | ICD-10-CM | POA: Insufficient documentation

## 2013-08-12 HISTORY — PX: ABDOMINOPLASTY: SHX5355

## 2013-08-12 LAB — POCT HEMOGLOBIN-HEMACUE: HEMOGLOBIN: 11.4 g/dL — AB (ref 12.0–15.0)

## 2013-08-12 SURGERY — ABDOMINOPLASTY
Anesthesia: General

## 2013-08-12 MED ORDER — LIDOCAINE HCL (CARDIAC) 20 MG/ML IV SOLN
INTRAVENOUS | Status: DC | PRN
  Administered 2013-08-12: 80 mg via INTRAVENOUS

## 2013-08-12 MED ORDER — FENTANYL CITRATE 0.05 MG/ML IJ SOLN
INTRAMUSCULAR | Status: AC
  Filled 2013-08-12: qty 6

## 2013-08-12 MED ORDER — HYDROMORPHONE HCL PF 1 MG/ML IJ SOLN
0.2500 mg | INTRAMUSCULAR | Status: DC | PRN
  Administered 2013-08-12: 0.5 mg via INTRAVENOUS
  Administered 2013-08-12 (×2): 0.25 mg via INTRAVENOUS

## 2013-08-12 MED ORDER — OXYCODONE HCL 5 MG PO TABS
ORAL_TABLET | ORAL | Status: AC
  Filled 2013-08-12: qty 1

## 2013-08-12 MED ORDER — LACTATED RINGERS IV SOLN
INTRAVENOUS | Status: DC
  Administered 2013-08-12: 07:00:00 via INTRAVENOUS

## 2013-08-12 MED ORDER — SODIUM CHLORIDE 0.9 % IV SOLN
INTRAVENOUS | Status: DC | PRN
  Administered 2013-08-12: 08:00:00 via INTRAMUSCULAR

## 2013-08-12 MED ORDER — OXYCODONE HCL 5 MG PO TABS
5.0000 mg | ORAL_TABLET | Freq: Once | ORAL | Status: AC | PRN
  Administered 2013-08-12: 5 mg via ORAL

## 2013-08-12 MED ORDER — DEXAMETHASONE SODIUM PHOSPHATE 4 MG/ML IJ SOLN
INTRAMUSCULAR | Status: DC | PRN
  Administered 2013-08-12: 10 mg via INTRAVENOUS

## 2013-08-12 MED ORDER — MINERAL OIL LIGHT 100 % EX OIL
TOPICAL_OIL | CUTANEOUS | Status: AC
  Filled 2013-08-12: qty 25

## 2013-08-12 MED ORDER — NEOSTIGMINE METHYLSULFATE 1 MG/ML IJ SOLN
INTRAMUSCULAR | Status: DC | PRN
  Administered 2013-08-12: 4 mg via INTRAVENOUS

## 2013-08-12 MED ORDER — OXYCODONE HCL 5 MG/5ML PO SOLN: 5.0000 mg | Freq: Once | ORAL | Status: AC | PRN

## 2013-08-12 MED ORDER — ONDANSETRON HCL 4 MG/2ML IJ SOLN
INTRAMUSCULAR | Status: DC | PRN
  Administered 2013-08-12: 4 mg via INTRAVENOUS

## 2013-08-12 MED ORDER — ROCURONIUM BROMIDE 100 MG/10ML IV SOLN
INTRAVENOUS | Status: DC | PRN
  Administered 2013-08-12: 40 mg via INTRAVENOUS

## 2013-08-12 MED ORDER — CEFAZOLIN SODIUM-DEXTROSE 2-3 GM-% IV SOLR
INTRAVENOUS | Status: AC
  Filled 2013-08-12: qty 50

## 2013-08-12 MED ORDER — MIDAZOLAM HCL 2 MG/2ML IJ SOLN
INTRAMUSCULAR | Status: AC
  Filled 2013-08-12: qty 2

## 2013-08-12 MED ORDER — FENTANYL CITRATE 0.05 MG/ML IJ SOLN: 50.0000 ug | INTRAMUSCULAR | Status: DC | PRN

## 2013-08-12 MED ORDER — LIDOCAINE-EPINEPHRINE 0.5 %-1:200000 IJ SOLN
INTRAMUSCULAR | Status: AC
  Filled 2013-08-12: qty 2

## 2013-08-12 MED ORDER — MIDAZOLAM HCL 5 MG/5ML IJ SOLN
INTRAMUSCULAR | Status: DC | PRN
  Administered 2013-08-12: 2 mg via INTRAVENOUS

## 2013-08-12 MED ORDER — HYDROMORPHONE HCL PF 1 MG/ML IJ SOLN
INTRAMUSCULAR | Status: AC
  Filled 2013-08-12: qty 1

## 2013-08-12 MED ORDER — CEFAZOLIN SODIUM-DEXTROSE 2-3 GM-% IV SOLR
2.0000 g | INTRAVENOUS | Status: AC
  Administered 2013-08-12: 2 g via INTRAVENOUS

## 2013-08-12 MED ORDER — GLYCOPYRROLATE 0.2 MG/ML IJ SOLN
INTRAMUSCULAR | Status: DC | PRN
  Administered 2013-08-12: 0.6 mg via INTRAVENOUS

## 2013-08-12 MED ORDER — PROPOFOL 10 MG/ML IV BOLUS
INTRAVENOUS | Status: DC | PRN
  Administered 2013-08-12: 200 mg via INTRAVENOUS

## 2013-08-12 MED ORDER — MIDAZOLAM HCL 2 MG/2ML IJ SOLN: 1.0000 mg | INTRAMUSCULAR | Status: DC | PRN

## 2013-08-12 MED ORDER — FENTANYL CITRATE 0.05 MG/ML IJ SOLN
INTRAMUSCULAR | Status: DC | PRN
  Administered 2013-08-12: 100 ug via INTRAVENOUS
  Administered 2013-08-12: 50 ug via INTRAVENOUS

## 2013-08-12 MED ORDER — PROMETHAZINE HCL 25 MG/ML IJ SOLN: 6.2500 mg | INTRAMUSCULAR | Status: DC | PRN

## 2013-08-12 SURGICAL SUPPLY — 78 items
APPLICATOR COTTON TIP 6IN STRL (MISCELLANEOUS) ×2 IMPLANT
BAG DECANTER FOR FLEXI CONT (MISCELLANEOUS) IMPLANT
BENZOIN TINCTURE PRP APPL 2/3 (GAUZE/BANDAGES/DRESSINGS) ×4 IMPLANT
BINDER ABDOMINAL 10 UNV 27-48 (MISCELLANEOUS) IMPLANT
BINDER ABDOMINAL 12 SM 30-45 (SOFTGOODS) ×2 IMPLANT
BLADE HEX COATED 2.75 (ELECTRODE) ×2 IMPLANT
BLADE KNIFE PERSONA 10 (BLADE) ×2 IMPLANT
BLADE KNIFE PERSONA 15 (BLADE) ×2 IMPLANT
BNDG COHESIVE 4X5 TAN STRL (GAUZE/BANDAGES/DRESSINGS) IMPLANT
CANISTER SUCT 1200ML W/VALVE (MISCELLANEOUS) ×2 IMPLANT
COMPRESSION GARMENT LG MOREWEL (MISCELLANEOUS) IMPLANT
COTTONBALL LRG STERILE PKG (GAUZE/BANDAGES/DRESSINGS) IMPLANT
COVER MAYO STAND STRL (DRAPES) ×2 IMPLANT
COVER TABLE BACK 60X90 (DRAPES) ×2 IMPLANT
DRAIN CHANNEL 10F 3/8 F FF (DRAIN) ×2 IMPLANT
DRAPE LAPAROSCOPIC ABDOMINAL (DRAPES) ×2 IMPLANT
DRAPE UTILITY XL STRL (DRAPES) ×2 IMPLANT
DRSG PAD ABDOMINAL 8X10 ST (GAUZE/BANDAGES/DRESSINGS) ×8 IMPLANT
ELECT BLADE 6.5 .24CM SHAFT (ELECTRODE) IMPLANT
ELECT REM PT RETURN 9FT ADLT (ELECTROSURGICAL) ×2
ELECTRODE REM PT RTRN 9FT ADLT (ELECTROSURGICAL) ×1 IMPLANT
EVACUATOR 3/16  PVC DRAIN (DRAIN)
EVACUATOR 3/16 PVC DRAIN (DRAIN) IMPLANT
EVACUATOR SILICONE 100CC (DRAIN) ×2 IMPLANT
FILTER 7/8 IN (FILTER) ×2 IMPLANT
FILTER LIPOSUCTION (MISCELLANEOUS) IMPLANT
GAUZE XEROFORM 5X9 LF (GAUZE/BANDAGES/DRESSINGS) ×4 IMPLANT
GLOVE BIOGEL M STRL SZ7.5 (GLOVE) ×2 IMPLANT
GLOVE BIOGEL PI IND STRL 7.5 (GLOVE) ×1 IMPLANT
GLOVE BIOGEL PI INDICATOR 7.5 (GLOVE) ×1
GLOVE ECLIPSE 7.0 STRL STRAW (GLOVE) ×2 IMPLANT
GOWN STRL REUS W/ TWL LRG LVL3 (GOWN DISPOSABLE) IMPLANT
GOWN STRL REUS W/ TWL XL LVL3 (GOWN DISPOSABLE) ×1 IMPLANT
GOWN STRL REUS W/TWL LRG LVL3 (GOWN DISPOSABLE)
GOWN STRL REUS W/TWL XL LVL3 (GOWN DISPOSABLE) ×1
HOLDER FOLEY CATH W/STRAP (MISCELLANEOUS) IMPLANT
IV LACTATED RINGERS 1000ML (IV SOLUTION) IMPLANT
IV NS 500ML (IV SOLUTION) ×1
IV NS 500ML BAXH (IV SOLUTION) ×1 IMPLANT
LINER CANISTER 1000CC FLEX (MISCELLANEOUS) ×2 IMPLANT
NDL SAFETY ECLIPSE 18X1.5 (NEEDLE) IMPLANT
NEEDLE HYPO 18GX1.5 SHARP (NEEDLE)
NEEDLE SPNL 18GX3.5 QUINCKE PK (NEEDLE) IMPLANT
NS IRRIG 1000ML POUR BTL (IV SOLUTION) IMPLANT
PACK BASIN DAY SURGERY FS (CUSTOM PROCEDURE TRAY) ×2 IMPLANT
PEN SKIN MARKING BROAD TIP (MISCELLANEOUS) ×2 IMPLANT
PIN SAFETY STERILE (MISCELLANEOUS) ×2 IMPLANT
SHEET MEDIUM DRAPE 40X70 STRL (DRAPES) ×8 IMPLANT
SHEETING SILICONE GEL EPI DERM (MISCELLANEOUS) ×2 IMPLANT
SLEEVE SCD COMPRESS KNEE MED (MISCELLANEOUS) ×2 IMPLANT
SPONGE GAUZE 4X4 12PLY (GAUZE/BANDAGES/DRESSINGS) ×4 IMPLANT
SPONGE LAP 18X18 X RAY DECT (DISPOSABLE) ×6 IMPLANT
STAPLER VISISTAT 35W (STAPLE) ×2 IMPLANT
STOCKINETTE IMPERVIOUS LG (DRAPES) IMPLANT
STRIP SUTURE WOUND CLOSURE 1/2 (SUTURE) ×6 IMPLANT
SUT ETHILON 3 0 PS 1 (SUTURE) ×2 IMPLANT
SUT MNCRL AB 3-0 PS2 18 (SUTURE) ×2 IMPLANT
SUT MON AB 2-0 CT1 36 (SUTURE) ×4 IMPLANT
SUT MON AB 5-0 PS2 18 (SUTURE) IMPLANT
SUT NOVA NAB DX-16 0-1 5-0 T12 (SUTURE) ×4 IMPLANT
SUT NOVA NAB GS-22 2 0 T19 (SUTURE) IMPLANT
SUT PROLENE 1 CT (SUTURE) ×4 IMPLANT
SUT PROLENE 4 0 PS 2 18 (SUTURE) ×2 IMPLANT
SUT SILK 3 0 TIES 17X18 (SUTURE)
SUT SILK 3-0 18XBRD TIE BLK (SUTURE) IMPLANT
SYR 50ML LL SCALE MARK (SYRINGE) ×4 IMPLANT
SYR CONTROL 10ML LL (SYRINGE) IMPLANT
TAPE HYPAFIX 6X30 (GAUZE/BANDAGES/DRESSINGS) ×2 IMPLANT
TAPE MEASURE 72IN RETRACT (INSTRUMENTS)
TAPE MEASURE LINEN 72IN RETRCT (INSTRUMENTS) IMPLANT
TOWEL OR 17X24 6PK STRL BLUE (TOWEL DISPOSABLE) ×8 IMPLANT
TOWEL OR NON WOVEN STRL DISP B (DISPOSABLE) ×2 IMPLANT
TRAY FOLEY CATH 14FR (SET/KITS/TRAYS/PACK) ×2 IMPLANT
TUBE CONNECTING 20X1/4 (TUBING) ×2 IMPLANT
TUBING SET GRADUATE ASPIR 12FT (MISCELLANEOUS) IMPLANT
UNDERPAD 30X30 INCONTINENT (UNDERPADS AND DIAPERS) ×4 IMPLANT
VAC PENCILS W/TUBING CLEAR (MISCELLANEOUS) ×2 IMPLANT
YANKAUER SUCT BULB TIP NO VENT (SUCTIONS) ×2 IMPLANT

## 2013-08-12 NOTE — Discharge Instructions (Signed)
Activity (include date of return to work if known) °As tolerated: NO showers °NO driving °No heavy activities ° °Diet:regular No restrictions: ° °Wound Care: Keep dressing clean & dry ° °Do not change dressings °For Abdominoplasties wear abdominal binder °Special Instructions: °Do not raise arms up °Continue to empty, recharge, & record drainage from J-P drains &/or °Hemovacs 2-3 times a day, as needed. °Call Doctor if any unusual problems occur such as pain, excessive °Bleeding, unrelieved Nausea/vomiting, Fever &/or chills °When lying down, keep head elevated on 2-3 pillows or back-rest °For Addominoplasties the Jack-knife position °Follow-up appointment: Please call the office. ° °The patient received discharge instruction from:___________________________________________ ° ° ° ° ° °Patient signature ________________________________________ / Date___________ ° ° ° °Signature of individual providing instructions/ Date________________             ° °About my Jackson-Pratt Bulb Drain ° °What is a Jackson-Pratt bulb? °A Jackson-Pratt is a soft, round device used to collect drainage. It is connected to a long, thin drainage catheter, which is held in place by one or two small stiches near your surgical incision site. When the bulb is squeezed, it forms a vacuum, forcing the drainage to empty into the bulb. ° °Emptying the Jackson-Pratt bulb- °To empty the bulb: °1. Release the plug on the top of the bulb. °2. Pour the bulb's contents into a measuring container which your nurse will provide. °3. Record the time emptied and amount of drainage. Empty the drain(s) as often as your     doctor or nurse recommends. ° °Date                  Time                    Amount (Drain 1)                 Amount (Drain  2) ° °_____________________________________________________________________ ° °_____________________________________________________________________ ° °_____________________________________________________________________ ° °_____________________________________________________________________ ° °_____________________________________________________________________ ° °_____________________________________________________________________ ° °_____________________________________________________________________ ° °_____________________________________________________________________ ° °Squeezing the Jackson-Pratt Bulb- °To squeeze the bulb: °1. Make sure the plug at the top of the bulb is open. °2. Squeeze the bulb tightly in your fist. You will hear air squeezing from the bulb. °3. Replace the plug while the bulb is squeezed. °4. Use a safety pin to attach the bulb to your clothing. This will keep the catheter from     pulling at the bulb insertion site. ° °When to call your doctor- °Call your doctor if: °· Drain site becomes red, swollen or hot. °· You have a fever greater than 101 degrees F. °· There is oozing at the drain site. °· Drain falls out (apply a guaze bandage over the drain hole and secure it with tape). °· Drainage increases daily not related to activity patterns. (You will usually have more drainage when you are active than when you are resting.) °· Drainage has a bad odor. ° ° °Post Anesthesia Home Care Instructions ° °Activity: °Get plenty of rest for the remainder of the day. A responsible adult should stay with you for 24 hours following the procedure.  °For the next 24 hours, DO NOT: °-Drive a car °-Operate machinery °-Drink alcoholic beverages °-Take any medication unless instructed by your physician °-Make any legal decisions or sign important papers. ° °Meals: °Start with liquid foods such as gelatin or soup. Progress to regular foods as tolerated. Avoid greasy, spicy, heavy foods. If nausea  and/or vomiting occur, drink only clear liquids until the nausea and/or   vomiting subsides. Call your physician if vomiting continues. ° °Special Instructions/Symptoms: °Your throat may feel dry or sore from the anesthesia or the breathing tube placed in your throat during surgery. If this causes discomfort, gargle with warm salt water. The discomfort should disappear within 24 hours. ° °

## 2013-08-12 NOTE — Anesthesia Procedure Notes (Signed)
Procedure Name: Intubation Performed by: Terrance Mass Pre-anesthesia Checklist: Patient identified, Timeout performed, Emergency Drugs available, Suction available and Patient being monitored Patient Re-evaluated:Patient Re-evaluated prior to inductionOxygen Delivery Method: Circle system utilized Preoxygenation: Pre-oxygenation with 100% oxygen Intubation Type: IV induction Ventilation: Mask ventilation without difficulty Laryngoscope Size: 2 Grade View: Grade I Tube type: Oral Tube size: 7.0 mm Number of attempts: 1 Airway Equipment and Method: Stylet Placement Confirmation: ETT inserted through vocal cords under direct vision,  breath sounds checked- equal and bilateral and positive ETCO2 Secured at: 22 cm Tube secured with: Tape Dental Injury: Teeth and Oropharynx as per pre-operative assessment

## 2013-08-12 NOTE — Anesthesia Preprocedure Evaluation (Signed)
Anesthesia Evaluation  Patient identified by MRN, date of birth, ID band Patient awake    Reviewed: Allergy & Precautions, H&P , NPO status , Patient's Chart, lab work & pertinent test results  History of Anesthesia Complications Negative for: history of anesthetic complications  Airway Mallampati: I      Dental   Pulmonary Current Smoker,  breath sounds clear to auscultation        Cardiovascular negative cardio ROS  Rhythm:Regular Rate:Normal     Neuro/Psych negative neurological ROS     GI/Hepatic negative GI ROS, Neg liver ROS,   Endo/Other  negative endocrine ROS  Renal/GU negative Renal ROS     Musculoskeletal   Abdominal   Peds  Hematology negative hematology ROS (+)   Anesthesia Other Findings   Reproductive/Obstetrics                           Anesthesia Physical Anesthesia Plan  ASA: II  Anesthesia Plan: General   Post-op Pain Management:    Induction: Intravenous  Airway Management Planned:   Additional Equipment:   Intra-op Plan:   Post-operative Plan: Extubation in OR  Informed Consent: I have reviewed the patients History and Physical, chart, labs and discussed the procedure including the risks, benefits and alternatives for the proposed anesthesia with the patient or authorized representative who has indicated his/her understanding and acceptance.   Dental advisory given  Plan Discussed with: CRNA and Surgeon  Anesthesia Plan Comments:         Anesthesia Quick Evaluation

## 2013-08-12 NOTE — Anesthesia Postprocedure Evaluation (Signed)
  Anesthesia Post-op Note  Patient: Robin Pace  Procedure(s) Performed: Procedure(s): 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 (N/A)  Patient Location: PACU  Anesthesia Type:General  Level of Consciousness: awake and alert   Airway and Oxygen Therapy: Patient Spontanous Breathing  Post-op Pain: mild  Post-op Assessment: Post-op Vital signs reviewed  Post-op Vital Signs: stable  Last Vitals:  Filed Vitals:   08/12/13 1015  BP:   Pulse: 83  Temp:   Resp: 13    Complications: No apparent anesthesia complications

## 2013-08-12 NOTE — Brief Op Note (Signed)
08/12/2013  8:53 AM  PATIENT:  Robin Pace  48 y.o. female  PRE-OPERATIVE DIAGNOSIS:  SEVERE DIASTASIS RECTI/VENTRAL HERNIA OF ABDOMEN  POST-OPERATIVE DIAGNOSIS:  SEVERE DIASTASIS RECTI/VENTRAL HERNIA OF   PROCEDURE:  Procedure(s): 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 (N/A)  SURGEON:  Surgeon(s) and Role:    * Cristine Polio, MD - Primary  PHYSICIAN ASSISTANT:   ASSISTANTS: none   ANESTHESIA:   general  EBL:  Total I/O In: 1600 [I.V.:1600] Out: -   BLOOD ADMINISTERED:none  DRAINS: (#10 Terrial Rhodes ) Jackson-Pratt drain(s) with closed bulb suction in the #10 Terrial Rhodes   LOCAL MEDICATIONS USED:  LIDOCAINE   SPECIMEN:  Excision  DISPOSITION OF SPECIMEN:  PATHOLOGY  COUNTS:  YES  TOURNIQUET:  * No tourniquets in log *  DICTATION: .Other Dictation: Dictation Number [id number]  PLAN OF CARE: Admit to inpatient   PATIENT DISPOSITION:  PACU - hemodynamically stable.   Delay start of Pharmacological VTE agent (>24hrs) due to surgical blood loss or risk of bleeding: yes

## 2013-08-12 NOTE — H&P (Signed)
Robin Pace is an 48 y.o. female.   Chief Complaint: Increased protruberance abdomen HPI: Increased swelling and protruberance abdomen with herniation ,mass and  4 plus diastasis recti  Past Medical History  Diagnosis Date  . Anemia   . Medical history non-contributory   . Wears dentures     top    Past Surgical History  Procedure Laterality Date  . Tubal ligation    . Cesarean section    . Incise and drain abcess  2005    rt middle finger   . Mass excision Right 08/06/2012    Procedure: EXCISION OF LARGE MASS right FLANK WITH LIPO ASSISTANCE;  Surgeon: Cristine Polio, MD;  Location: [facility];  Service: Plastics;  Laterality: Right;    Family History  Problem Relation Age of Onset  . Hypertension Mother   . Heart disease Father   . Heart disease Brother    Social History:  reports that she has been smoking.  She does not have any smokeless tobacco history on file. She reports that she drinks alcohol. She reports that she does not use illicit drugs.  Allergies: No Known Allergies  Medications Prior to Admission  Medication Sig Dispense Refill  . acetaminophen (TYLENOL) 325 MG tablet Take 650 mg by mouth every 6 (six) hours as needed.      . pseudoephedrine-acetaminophen (TYLENOL SINUS) 30-500 MG TABS Take 1 tablet by mouth every 4 (four) hours as needed.        No results found for this or any previous visit (from the past 48 hour(s)). No results found.  Review of Systems  Constitutional: Negative.   Eyes: Negative.   Respiratory: Negative.   Cardiovascular: Negative.   Gastrointestinal: Negative.   Genitourinary: Negative.   Musculoskeletal: Negative.   Skin: Negative.   Neurological: Positive for headaches.  Endo/Heme/Allergies: Negative.   Psychiatric/Behavioral: Negative.     Blood pressure 152/111, pulse 81, temperature 98 F (36.7 C), temperature source Oral, resp. rate 20, height 5\' 9"  (1.753 m), weight 89.086 kg (196 lb 6.4 oz),  last menstrual period 05/08/2013, SpO2 100.00%. 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

## 2013-08-12 NOTE — Transfer of Care (Signed)
Immediate Anesthesia Transfer of Care Note  Patient: Robin Pace  Procedure(s) Performed: Procedure(s): 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 (N/A)  Patient Location: PACU  Anesthesia Type:General  Level of Consciousness: awake, alert  and oriented  Airway & Oxygen Therapy: Patient Spontanous Breathing and Patient connected to face mask oxygen  Post-op Assessment: Report given to PACU RN and Post -op Vital signs reviewed and stable  Post vital signs: Reviewed and stable  Complications: No apparent anesthesia complications

## 2013-08-13 NOTE — Op Note (Signed)
NAMESUSANNE, BAUMGARNER              ACCOUNT NO.:  [id number]  MEDICAL RECORD NO.:  [id number]  LOCATION:                                 FACILITY:  PHYSICIAN:  Ellington Towanda Malkin, M.D.DATE OF BIRTH:  1965-08-17  DATE OF PROCEDURE:  08/12/2013 DATE OF DISCHARGE:  08/12/2013                              OPERATIVE REPORT   INDICATIONS:  This is a 48 year old lady with increased abdominal distention involving her left upper abdomen above the belly button area. The area has increased extension when she eats sometimes without is getting worse.  On examination, shows ventral herniation and the area measured approximately 12 x 8 cm, also has a mass defect above it.  PROCEDURES DONE:  Exploration of the abdomen, repaired diastasis recti, removal of large abdominal mass, consistency of a large lipoma, and repair of ventral hernia.  ANESTHESIA:  General.  DESCRIPTION OF PROCEDURE:  Preoperatively, the patient was sat up and drawn for the areas of protrusion.  She underwent general anesthesia and intubated orally.  Prep was done to the chest, breasts, abdominal areas in routine fashion using Hibiclens soap and solution and walled off with sterile towels and drapes so as to make a sterile field.  A 1.5% Xylocaine with epinephrine was injected locally, a total of 150 mL for vasoconstriction.  The air was approached through an upside-down Omega incision, which transcend above the belly button, and across each side with a #15 blade.  Dissection was then carried down through skin and subcutaneous tissue to the underlying fascia.  This was dissected from down and went up revealing large defect in the fascia.  Next, a mass effect which was also dissected out was also removed surgically using a Bovie on coagulation and coagulating off the whole area, which measured a large at least 6 x 8 inches.  After proper hemostasis using the Bovie anticoagulation diastasis recti, which was localized from that  area was repaired with multiple sutures of #1 Novafil suture.  Next, the hernia repair was also done likewise with approximately 8 sutures.  The wounds were cleansed.  The flaps were laid back down and then reconstructed and sutured with multiple sutures of 2-0 Monocryl x2 layers and subcutaneous plane.  Then, a running subcuticular stitch of 3-0 Monocryl and 0.5 inch Steri-Strips were applied.  The wounds were drained with a #10 fully fluted Blake drain, which was placed in the depths of the wound and brought out through the lateral-most portion of the incision on the right side and secured with 3-0 Prolene.  Steri-Strips and soft dressing and silicone gel patch was placed and other dressings and abdominal support.  She withstood the procedures very well and was taken to recovery in excellent condition.     Odella Aquas. Towanda Malkin, M.D.   ______________________________ Odella Aquas. Towanda Malkin, M.D.    Elie Confer  D:  08/12/2013  T:  08/13/2013  Job:  [id number]

## 2013-08-17 ENCOUNTER — Encounter (HOSPITAL_COMMUNITY): Payer: Self-pay | Admitting: Emergency Medicine

## 2013-08-17 ENCOUNTER — Emergency Department (HOSPITAL_COMMUNITY)
Admission: EM | Admit: 2013-08-17 | Discharge: 2013-08-17 | Disposition: A | Discharge status: Home / Self Care | Payer: Medicaid Other | Attending: Emergency Medicine | Admitting: Emergency Medicine

## 2013-08-17 DIAGNOSIS — F17200 Nicotine dependence, unspecified, uncomplicated: Secondary | ICD-10-CM | POA: Insufficient documentation

## 2013-08-17 DIAGNOSIS — Z862 Personal history of diseases of the blood and blood-forming organs and certain disorders involving the immune mechanism: Secondary | ICD-10-CM | POA: Insufficient documentation

## 2013-08-17 DIAGNOSIS — Z98811 Dental restoration status: Secondary | ICD-10-CM | POA: Insufficient documentation

## 2013-08-17 DIAGNOSIS — R519 Headache, unspecified: Secondary | ICD-10-CM

## 2013-08-17 DIAGNOSIS — R42 Dizziness and giddiness: Secondary | ICD-10-CM | POA: Insufficient documentation

## 2013-08-17 DIAGNOSIS — R51 Headache: Secondary | ICD-10-CM | POA: Insufficient documentation

## 2013-08-17 DIAGNOSIS — M79609 Pain in unspecified limb: Secondary | ICD-10-CM | POA: Insufficient documentation

## 2013-08-17 DIAGNOSIS — H53149 Visual discomfort, unspecified: Secondary | ICD-10-CM | POA: Insufficient documentation

## 2013-08-17 DIAGNOSIS — M549 Dorsalgia, unspecified: Secondary | ICD-10-CM | POA: Insufficient documentation

## 2013-08-17 DIAGNOSIS — M542 Cervicalgia: Secondary | ICD-10-CM

## 2013-08-17 DIAGNOSIS — Z79899 Other long term (current) drug therapy: Secondary | ICD-10-CM | POA: Insufficient documentation

## 2013-08-17 LAB — BASIC METABOLIC PANEL
BUN: 15 mg/dL (ref 6–23)
CO2: 24 meq/L (ref 19–32)
Calcium: 9.8 mg/dL (ref 8.4–10.5)
Chloride: 101 mEq/L (ref 96–112)
Creatinine, Ser: 0.88 mg/dL (ref 0.50–1.10)
GFR calc Af Amer: 90 mL/min — ABNORMAL LOW (ref >90)
GFR, EST NON AFRICAN AMERICAN: 78 mL/min — AB (ref >90)
GLUCOSE: 102 mg/dL — AB (ref 70–99)
POTASSIUM: 4.3 meq/L (ref 3.7–5.3)
SODIUM: 137 meq/L (ref 137–147)

## 2013-08-17 LAB — CBC WITH DIFFERENTIAL/PLATELET
Basophils Absolute: 0 10*3/uL (ref 0.0–0.1)
Basophils Relative: 0 % (ref 0–1)
Eosinophils Absolute: 0.2 10*3/uL (ref 0.0–0.7)
Eosinophils Relative: 3 % (ref 0–5)
HCT: 37.9 % (ref 36.0–46.0)
Hemoglobin: 12.4 g/dL (ref 12.0–15.0)
LYMPHS PCT: 31 % (ref 12–46)
Lymphs Abs: 1.9 10*3/uL (ref 0.7–4.0)
MCH: 27.4 pg (ref 26.0–34.0)
MCHC: 32.7 g/dL (ref 30.0–36.0)
MCV: 83.7 fL (ref 78.0–100.0)
Monocytes Absolute: 0.7 10*3/uL (ref 0.1–1.0)
Monocytes Relative: 11 % (ref 3–12)
NEUTROS ABS: 3.4 10*3/uL (ref 1.7–7.7)
NEUTROS PCT: 55 % (ref 43–77)
PLATELETS: 289 10*3/uL (ref 150–400)
RBC: 4.53 MIL/uL (ref 3.87–5.11)
RDW: 13.6 % (ref 11.5–15.5)
WBC: 6.1 10*3/uL (ref 4.0–10.5)

## 2013-08-17 MED ORDER — DEXAMETHASONE SODIUM PHOSPHATE 10 MG/ML IJ SOLN
10.0000 mg | Freq: Once | INTRAMUSCULAR | Status: AC
  Administered 2013-08-17: 10 mg via INTRAVENOUS
  Filled 2013-08-17: qty 1

## 2013-08-17 MED ORDER — HYDROCODONE-ACETAMINOPHEN 5-325 MG PO TABS: 2.0000 | ORAL_TABLET | ORAL | Status: DC | PRN

## 2013-08-17 MED ORDER — DIPHENHYDRAMINE HCL 50 MG/ML IJ SOLN
25.0000 mg | Freq: Once | INTRAMUSCULAR | Status: AC
  Administered 2013-08-17: 25 mg via INTRAVENOUS
  Filled 2013-08-17: qty 1

## 2013-08-17 MED ORDER — METOCLOPRAMIDE HCL 5 MG/ML IJ SOLN
10.0000 mg | Freq: Once | INTRAMUSCULAR | Status: AC
  Administered 2013-08-17: 10 mg via INTRAVENOUS
  Filled 2013-08-17: qty 2

## 2013-08-17 NOTE — ED Provider Notes (Signed)
CSN: [id number]     Arrival date & time 08/17/13  1305 History   First MD Initiated Contact with Patient 08/17/13 1344     Chief Complaint  Patient presents with  . Back Pain  . Neck Pain  . Leg Pain     (Consider location/radiation/quality/duration/timing/severity/associated sxs/prior Treatment) HPI Comments: Patient presents with pain to the right side of her neck and her right head. She states and neck pains been gone on about a week. It's worse with certain head positions.  At time, the pain radiates down her right arm, and has had some pain in her right leg.  Denies numbness or weakness to extremities.  The headaches going on for last 3-4 days. Scratchy been getting worse since that time. She was seen a [facility] about a week ago for sudden onset of a headache in the back of her head. She had a head CT which was negative per her report. She was given migraine cocktail and the headache went away. It came back again a few days later. She does see this headache feels different than the headaches she had at that time. She doesn't have a known history of migraines but she says she has a family history of migraines. She denies any recent injuries to her head or neck. She denies any numbness to her hands or feet. She denies any facial numbness or aphasia. She does state that she's had some intermittent blurry vision. She had recent ventral hernia repair and says that her abdomen is doing well. She denies any ongoing abdominal pain or vomiting. She has a JP drain in place which she says it's been draining. She denies any fevers or chills. She denies any associated nausea or vomiting with the headache. She does report some photophobia.  Patient is a 48 y.o. female presenting with back pain, neck pain, and leg pain.  Back Pain Associated symptoms: headaches and leg pain   Associated symptoms: no abdominal pain, no chest pain, no fever, no numbness and no weakness   Neck  Pain Associated symptoms: headaches, leg pain and photophobia   Associated symptoms: no chest pain, no fever, no numbness and no weakness   Leg Pain Associated symptoms: back pain and neck pain   Associated symptoms: no fatigue and no fever     Past Medical History  Diagnosis Date  . Anemia   . Medical history non-contributory   . Wears dentures     top   Past Surgical History  Procedure Laterality Date  . Tubal ligation    . Cesarean section    . Incise and drain abcess  2005    rt middle finger   . Mass excision Right 08/06/2012    Procedure: EXCISION OF LARGE MASS right FLANK WITH LIPO ASSISTANCE;  Surgeon: Cristine Polio, MD;  Location: [place];  Service: Plastics;  Laterality: Right;   Family History  Problem Relation Age of Onset  . Hypertension Mother   . Heart disease Father   . Heart disease Brother    History  Substance Use Topics  . Smoking status: Current Every Day Smoker -- 0.50 packs/day  . Smokeless tobacco: Not on file  . Alcohol Use: Yes     Comment: occassional   OB History   Grav Para Term Preterm Abortions TAB SAB Ect Mult Living                 Review of Systems  Constitutional: Negative for fever,  chills, diaphoresis and fatigue.  HENT: Negative for congestion, rhinorrhea and sneezing.   Eyes: Positive for photophobia and visual disturbance (blurry vision).  Respiratory: Negative for cough, chest tightness and shortness of breath.   Cardiovascular: Negative for chest pain and leg swelling.  Gastrointestinal: Negative for nausea, vomiting, abdominal pain, diarrhea and blood in stool.  Genitourinary: Negative for frequency, hematuria, flank pain and difficulty urinating.  Musculoskeletal: Positive for back pain and neck pain. Negative for arthralgias.  Skin: Negative for rash.  Neurological: Positive for dizziness (has hx of vertigo, no dizziness now) and headaches. Negative for speech difficulty, weakness and numbness.       Allergies  Review of patient's allergies indicates no known allergies.  Home Medications   Prior to Admission medications   Medication Sig Start Date End Date Taking? Authorizing Provider  acetaminophen (TYLENOL) 325 MG tablet Take 650 mg by mouth every 6 (six) hours as needed for mild pain.    Yes Historical Provider, MD  cyclobenzaprine (FLEXERIL) 10 MG tablet Take 10 mg by mouth 3 (three) times daily as needed for muscle spasms.   Yes Historical Provider, MD  HYDROcodone-acetaminophen (NORCO) 10-325 MG per tablet Take 1 tablet by mouth every 6 (six) hours as needed for moderate pain.   Yes Historical Provider, MD  ibuprofen (ADVIL,MOTRIN) 800 MG tablet Take 800 mg by mouth every 8 (eight) hours as needed for moderate pain.   Yes Historical Provider, MD  meclizine (ANTIVERT) 12.5 MG tablet Take 12.5 mg by mouth 3 (three) times daily as needed for dizziness.   Yes Historical Provider, MD   BP 121/80  Pulse 93  Temp(Src) 98 F (36.7 C) (Oral)  Resp 16  SpO2 95%  LMP 05/08/2013 Physical Exam  Constitutional: She is oriented to person, place, and time. She appears well-developed and well-nourished.  HENT:  Head: Normocephalic and atraumatic.  Eyes: EOM are normal. Pupils are equal, round, and reactive to light.  Neck: Normal range of motion. Neck supple.  Tenderness along the right trapezius and right paraspinal area.  Cardiovascular: Normal rate, regular rhythm and normal heart sounds.   Pulmonary/Chest: Effort normal and breath sounds normal. No respiratory distress. She has no wheezes. She has no rales. She exhibits no tenderness.  Abdominal: Soft. Bowel sounds are normal. There is no tenderness. There is no rebound and no guarding.  Musculoskeletal: Normal range of motion. She exhibits no edema.  Lymphadenopathy:    She has no cervical adenopathy.  Neurological: She is alert and oriented to person, place, and time.  Motor 5/5 all extremities, sensation grossly intact to  LT all extremities.  CN intact.  No pronator drift.  Skin: Skin is warm and dry. No rash noted.  Psychiatric: She has a normal mood and affect.    ED Course  Procedures (including critical care time) Labs Review Results for orders placed during the hospital encounter of 08/17/13  CBC WITH DIFFERENTIAL      Result Value Ref Range   WBC 6.1  4.0 - 10.5 K/uL   RBC 4.53  3.87 - 5.11 MIL/uL   Hemoglobin 12.4  12.0 - 15.0 g/dL   HCT 37.9  36.0 - 46.0 %   MCV 83.7  78.0 - 100.0 fL   MCH 27.4  26.0 - 34.0 pg   MCHC 32.7  30.0 - 36.0 g/dL   RDW 13.6  11.5 - 15.5 %   Platelets 289  150 - 400 K/uL   Neutrophils Relative % 55  43 -  77 %   Neutro Abs 3.4  1.7 - 7.7 K/uL   Lymphocytes Relative 31  12 - 46 %   Lymphs Abs 1.9  0.7 - 4.0 K/uL   Monocytes Relative 11  3 - 12 %   Monocytes Absolute 0.7  0.1 - 1.0 K/uL   Eosinophils Relative 3  0 - 5 %   Eosinophils Absolute 0.2  0.0 - 0.7 K/uL   Basophils Relative 0  0 - 1 %   Basophils Absolute 0.0  0.0 - 0.1 K/uL  BASIC METABOLIC PANEL      Result Value Ref Range   Sodium 137  137 - 147 mEq/L   Potassium 4.3  3.7 - 5.3 mEq/L   Chloride 101  96 - 112 mEq/L   CO2 24  19 - 32 mEq/L   Glucose, Bld 102 (*) 70 - 99 mg/dL   BUN 15  6 - 23 mg/dL   Creatinine, Ser 0.88  0.50 - 1.10 mg/dL   Calcium 9.8  8.4 - 10.5 mg/dL   GFR calc non Af Amer 78 (*) >90 mL/min   GFR calc Af Amer 90 (*) >90 mL/min   No results found.   Imaging Review No results found.   EKG Interpretation None      MDM   Final diagnoses:  Headache  Neck pain    Patient presents with right-sided neck pain and associated headache. Denies any musculoskeletal in nature. Worse with movement of her neck. She has no posterior neck pain or other than signs of meningitis. She's afebrile. She has no associated neurologic deficits. She has some radicular nature to the pain. She has no numbness or weakness to her on. She was treated with a migraine cocktail in the ED and she's  feeling much better after this. She was discharged home with a prescription for Vicodin. She states her he has a muscle relaxer at home. She was using pain medication status post her surgery but she states she ran out of it. She will followup with her primary care physician on Monday.    Malvin Johns, MD 08/17/13 [phone]

## 2013-08-17 NOTE — ED Notes (Signed)
Pt states that she has been having rt sided whole body pain (from head to toe) since Saturday.  Had hernia surgery on Monday and still has JP drain in place.

## 2013-08-17 NOTE — Discharge Instructions (Signed)
Cervical Sprain A cervical sprain is an injury in the neck in which the strong, fibrous tissues (ligaments) that connect your neck bones stretch or tear. Cervical sprains can range from mild to severe. Severe cervical sprains can cause the neck vertebrae to be unstable. This can lead to damage of the spinal cord and can result in serious nervous system problems. The amount of time it takes for a cervical sprain to get better depends on the cause and extent of the injury. Most cervical sprains heal in 1 to 3 weeks. CAUSES  Severe cervical sprains may be caused by:   Contact sport injuries (such as from football, rugby, wrestling, hockey, auto racing, gymnastics, diving, martial arts, or boxing).   Motor vehicle collisions.   Whiplash injuries. This is an injury from a sudden forward-and backward whipping movement of the head and neck.  Falls.  Mild cervical sprains may be caused by:   Being in an awkward position, such as while cradling a telephone between your ear and shoulder.   Sitting in a chair that does not offer proper support.   Working at a poorly Landscape architect station.   Looking up or down for long periods of time.  SYMPTOMS   Pain, soreness, stiffness, or a burning sensation in the front, back, or sides of the neck. This discomfort may develop immediately after the injury or slowly, 24 hours or more after the injury.   Pain or tenderness directly in the middle of the back of the neck.   Shoulder or upper back pain.   Limited ability to move the neck.   Headache.   Dizziness.   Weakness, numbness, or tingling in the hands or arms.   Muscle spasms.   Difficulty swallowing or chewing.   Tenderness and swelling of the neck.  DIAGNOSIS  Most of the time your health care provider can diagnose a cervical sprain by taking your history and doing a physical exam. Your health care provider will ask about previous neck injuries and any known neck  problems, such as arthritis in the neck. X-rays may be taken to find out if there are any other problems, such as with the bones of the neck. Other tests, such as a CT scan or MRI, may also be needed.  TREATMENT  Treatment depends on the severity of the cervical sprain. Mild sprains can be treated with rest, keeping the neck in place (immobilization), and pain medicines. Severe cervical sprains are immediately immobilized. Further treatment is done to help with pain, muscle spasms, and other symptoms and may include:  Medicines, such as pain relievers, numbing medicines, or muscle relaxants.   Physical therapy. This may involve stretching exercises, strengthening exercises, and posture training. Exercises and improved posture can help stabilize the neck, strengthen muscles, and help stop symptoms from returning.  HOME CARE INSTRUCTIONS   Put ice on the injured area.   Put ice in a plastic bag.   Place a towel between your skin and the bag.   Leave the ice on for 15 20 minutes, 3 4 times a day.   If your injury was severe, you may have been given a cervical collar to wear. A cervical collar is a two-piece collar designed to keep your neck from moving while it heals.  Do not remove the collar unless instructed by your health care provider.  If you have long hair, keep it outside of the collar.  Ask your health care provider before making any adjustments to your collar.  Minor adjustments may be required over time to improve comfort and reduce pressure on your chin or on the back of your head.  Ifyou are allowed to remove the collar for cleaning or bathing, follow your health care provider's instructions on how to do so safely.  Keep your collar clean by wiping it with mild soap and water and drying it completely. If the collar you have been given includes removable pads, remove them every 1 2 days and hand wash them with soap and water. Allow them to air dry. They should be completely  dry before you wear them in the collar.  If you are allowed to remove the collar for cleaning and bathing, wash and dry the skin of your neck. Check your skin for irritation or sores. If you see any, tell your health care provider.  Do not drive while wearing the collar.   Only take over-the-counter or prescription medicines for pain, discomfort, or fever as directed by your health care provider.   Keep all follow-up appointments as directed by your health care provider.   Keep all physical therapy appointments as directed by your health care provider.   Make any needed adjustments to your workstation to promote good posture.   Avoid positions and activities that make your symptoms worse.   Warm up and stretch before being active to help prevent problems.  SEEK MEDICAL CARE IF:   Your pain is not controlled with medicine.   You are unable to decrease your pain medicine over time as planned.   Your activity level is not improving as expected.  SEEK IMMEDIATE MEDICAL CARE IF:   You develop any bleeding.  You develop stomach upset.  You have signs of an allergic reaction to your medicine.   Your symptoms get worse.   You develop new, unexplained symptoms.   You have numbness, tingling, weakness, or paralysis in any part of your body.  MAKE SURE YOU:   Understand these instructions.  Will watch your condition.  Will get help right away if you are not doing well or get worse. Document Released: 02/13/2007 Document Revised: 02/06/2013 Document Reviewed: 10/24/2012 [facility] Patient Information 2014 [place].  Migraine Headache A migraine headache is very bad, throbbing pain on one or both sides of your head. Talk to your doctor about what things may bring on (trigger) your migraine headaches. HOME CARE  Only take medicines as told by your doctor.  Lie down in a dark, quiet room when you have a migraine.  Keep a journal to find out if certain things  bring on migraine headaches. For example, write down:  What you eat and drink.  How much sleep you get.  Any change to your diet or medicines.  Lessen how much alcohol you drink.  Quit smoking if you smoke.  Get enough sleep.  Lessen any stress in your life.  Keep lights dim if bright lights bother you or make your migraines worse. GET HELP RIGHT AWAY IF:   Your migraine becomes really bad.  You have a fever.  You have a stiff neck.  You have trouble seeing.  Your muscles are weak, or you lose muscle control.  You lose your balance or have trouble walking.  You feel like you will pass out (faint), or you pass out.  You have really bad symptoms that are different than your first symptoms. MAKE SURE YOU:   Understand these instructions.  Will watch your condition.  Will get help right away if  you are not doing well or get worse. Document Released: 01/26/2008 Document Revised: 07/11/2011 Document Reviewed: 12/24/2012 [facility] Patient Information 2014 Robin Pace.

## 2013-08-19 ENCOUNTER — Encounter (HOSPITAL_BASED_OUTPATIENT_CLINIC_OR_DEPARTMENT_OTHER): Payer: Self-pay | Admitting: Specialist

## 2013-08-30 ENCOUNTER — Other Ambulatory Visit: Payer: Self-pay | Admitting: Internal Medicine

## 2013-08-30 DIAGNOSIS — D249 Benign neoplasm of unspecified breast: Secondary | ICD-10-CM

## 2013-09-09 ENCOUNTER — Encounter: Payer: Self-pay | Admitting: Neurology

## 2013-09-09 DIAGNOSIS — R51 Headache: Secondary | ICD-10-CM

## 2013-09-09 DIAGNOSIS — R519 Headache, unspecified: Secondary | ICD-10-CM | POA: Insufficient documentation

## 2013-09-09 DIAGNOSIS — R200 Anesthesia of skin: Secondary | ICD-10-CM | POA: Insufficient documentation

## 2013-09-10 ENCOUNTER — Ambulatory Visit (INDEPENDENT_AMBULATORY_CARE_PROVIDER_SITE_OTHER): Payer: Medicaid Other | Admitting: Neurology

## 2013-09-10 ENCOUNTER — Encounter: Payer: Self-pay | Admitting: Neurology

## 2013-09-10 VITALS — BP 148/99 | HR 99 | Ht 69.0 in | Wt 193.0 lb

## 2013-09-10 DIAGNOSIS — M545 Low back pain, unspecified: Secondary | ICD-10-CM

## 2013-09-10 DIAGNOSIS — M542 Cervicalgia: Secondary | ICD-10-CM | POA: Insufficient documentation

## 2013-09-10 DIAGNOSIS — R519 Headache, unspecified: Secondary | ICD-10-CM

## 2013-09-10 DIAGNOSIS — R269 Unspecified abnormalities of gait and mobility: Secondary | ICD-10-CM

## 2013-09-10 DIAGNOSIS — R51 Headache: Secondary | ICD-10-CM

## 2013-09-10 MED ORDER — RIZATRIPTAN BENZOATE 5 MG PO TBDP: 5.0000 mg | ORAL_TABLET | ORAL | Status: DC | PRN

## 2013-09-10 MED ORDER — NORTRIPTYLINE HCL 10 MG PO CAPS: ORAL_CAPSULE | ORAL | Status: DC

## 2013-09-10 NOTE — Progress Notes (Signed)
PATIENT: Robin Pace DOB: [birth year]  HISTORICAL  Robin Pace is a 48 year old female, referred by her primary care physician Dr. Burt Ek for evaluation of headaches, neck pain, right scapular area pain  She had a history of migraine headaches, in  April 5th 2015, it is her brothers birthday, who has passed away a year ago, she felt depressed, later noticed right upper neck pain, radiating to her right occipital region, also felt right arm, neck, flank area numbness tingling pain, especially at the right outer breast area,  her headache is associated with light noise sensitivity, she has thrown up couple times with her severe headaches.  She presented to [facility], reported normal CAT scan of the brain, her headache has much improved after she was given headache cocktail,  She used to work as a Social research officer, government,  but no longer able to work, because she had abdominal hernia repair, lipoma removal surgery recently, still wearing of tight abdominal band.  Since the initial episode, she has been complaining almost daily headaches, right-sided neck pain, radiating pain to her right arm, and shoulder, right flank area undepressed chest area discomfort, numbness, also right leg numbness,  She presented to the emergency room again in [place], complains of pain at the right side of her neck, and the right neck, radiating down her right arm, some pain in her right leg,  Laboratory evaluation normal CBC, BMP,  She had a past medical history of drug abuse, because of recent headaches, she used cocaine 3 days ago, Sep 07 2013,  REVIEW OF SYSTEMS: Full 14 system review of systems performed and notable only for fatigue, swelling in legs, spinning sensation, blurred vision, cough, easy bruising, feeling hot, increased thirst, joint pain, achy muscles, frequent infection, headaches, numbness, weakness, dizziness, sleepiness, restless leg, depression, decreased energy, change in  appetite, racing thoughts,  ALLERGIES: No Known Allergies  HOME MEDICATIONS: Current Outpatient Prescriptions on File Prior to Visit  Medication Sig Dispense Refill  . acetaminophen (TYLENOL) 325 MG tablet Take 650 mg by mouth every 6 (six) hours as needed for mild pain.       . cyclobenzaprine (FLEXERIL) 10 MG tablet Take 10 mg by mouth 3 (three) times daily as needed for muscle spasms.      Marland Kitchen HYDROcodone-acetaminophen (NORCO) 10-325 MG per tablet Take 1 tablet by mouth every 6 (six) hours as needed for moderate pain.      Marland Kitchen HYDROcodone-acetaminophen (NORCO/VICODIN) 5-325 MG per tablet Take 2 tablets by mouth every 4 (four) hours as needed.  20 tablet  0  . ibuprofen (ADVIL,MOTRIN) 800 MG tablet Take 800 mg by mouth every 8 (eight) hours as needed for moderate pain.      . meclizine (ANTIVERT) 12.5 MG tablet Take 12.5 mg by mouth 3 (three) times daily as needed for dizziness.       No current facility-administered medications on file prior to visit.    PAST MEDICAL HISTORY: Past Medical History  Diagnosis Date  . Anemia   . Medical history non-contributory   . Wears dentures     top  . Headache   . Numbness on right side     PAST SURGICAL HISTORY: Past Surgical History  Procedure Laterality Date  . Tubal ligation    . Cesarean section    . Incise and drain abcess  2005    rt middle finger   . Mass excision Right 08/06/2012    Procedure: EXCISION OF LARGE MASS  right FLANK WITH LIPO ASSISTANCE;  Surgeon: Cristine Polio, MD;  Location: [facility];  Service: Plastics;  Laterality: Right;  . Abdominoplasty N/A 08/12/2013    Procedure: 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;  Surgeon: Cristine Polio, MD;  Location: [place];  Service: Plastics;  Laterality: N/A;    FAMILY HISTORY: Family History  Problem Relation Age of Onset  . Hypertension Mother   . Heart disease Father   . Heart disease Brother     SOCIAL  HISTORY:  History   Social History  . Marital Status: Single    Spouse Name: N/A    Number of Children: 2  . Years of Education: college   Occupational History   She used to work as Chief Executive Officer    Social History Main Topics  . Smoking status: Current Every Day Smoker -- 0.50 packs/day  . Smokeless tobacco: Never Used  . Alcohol Use: Yes     Comment: occassional  . Drug Use: No     Comment: Quit three days ago.  Marland Kitchen Sexual Activity: Not Currently    Birth Control/ Protection: None   Other Topics Concern  . Not on file   Social History Narrative   Patient lives at home with her two children . Patient is single.   Patient is not working no trying for disability.   Right handed.   Caffeine. Sometimes coffee.     PHYSICAL EXAM   Filed Vitals:   09/10/13 0919  BP: 148/99  Pulse: 99  Height: 5\' 9"  (1.753 m)  Weight: 193 lb (87.544 kg)    Not recorded    Body mass index is 28.49 kg/(m^2).   Generalized: In no acute distress  Neck: Supple, no carotid bruits   Cardiac: Regular rate rhythm  Pulmonary: Clear to auscultation bilaterally  Musculoskeletal: No deformity, well-healed lower abdominal surgical scar,  Neurological examination  Mentation: Alert oriented to time, place, history taking, and causual conversation  Cranial nerve II-XII: Pupils were equal round reactive to light. Extraocular movements were full.  Visual field were full on confrontational test. Bilateral fundi were sharp.  Facial sensation and strength were normal. Hearing was intact to finger rubbing bilaterally. Uvula tongue midline.  Head turning and shoulder shrug and were normal and symmetric.Tongue protrusion into cheek strength was normal.  Motor: bilateral upper extremity strength were normal.   Sensory: Intact to fine touch, pinprick, preserved vibratory sensation, and proprioception at toes.  Coordination: Normal finger to nose, heel-to-shin bilaterally there was no truncal  ataxia  Gait: Rising up from seated position without assistance, normal stance, without trunk ataxia, moderate stride, good arm swing, smooth turning, able to perform tiptoe, and heel walking without difficulty.   Romberg signs: Negative  Deep tendon reflexes: Brachioradialis 2/2, biceps 2/2, triceps 2/2, patellar 2/2, Achilles 2/2, plantar responses were flexor bilaterally.   DIAGNOSTIC DATA (LABS, IMAGING, TESTING) - I reviewed patient records, labs, notes, testing and imaging myself where available.  Lab Results  Component Value Date   WBC 6.1 08/17/2013   HGB 12.4 08/17/2013   HCT 37.9 08/17/2013   MCV 83.7 08/17/2013   PLT 289 08/17/2013      Component Value Date/Time   NA 137 08/17/2013 1416   K 4.3 08/17/2013 1416   CL 101 08/17/2013 1416   CO2 24 08/17/2013 1416   GLUCOSE 102* 08/17/2013 1416   BUN 15 08/17/2013 1416   CREATININE 0.88 08/17/2013 1416   CALCIUM 9.8 08/17/2013 1416  PROT 6.8 07/31/2012 1550   ALBUMIN 3.4* 07/31/2012 1550   AST 14 07/31/2012 1550   ALT 14 07/31/2012 1550   ALKPHOS 65 07/31/2012 1550   BILITOT 0.2* 07/31/2012 1550   GFRNONAA 78* 08/17/2013 1416   GFRAA 90* 08/17/2013 1416   Lab Results  Component Value Date   CHOL 226* 03/31/2009   HDL 33* 03/31/2009   LDLCALC 147* 03/31/2009   TRIG 229* 03/31/2009   CHOLHDL 6.8 Ratio 03/31/2009   No results found for this basename: HGBA1C   No results found for this basename: VITAMINB12   Lab Results  Component Value Date   TSH 1.576 03/31/2009      ASSESSMENT AND PLAN  Robin Pace is a 48 y.o. female complains of frequent headaches, with migraine features, also has right-sided neck pain, right-sided paresthesia  1, need to rule out central nervous system etiology, proceed with MRI of the brain, and cervical spine,  2. nortriptyline, 10 mg, titrating to 20 mg every night for headache prevention 3. Maxalt as needed 4. return to clinic in 2-3 months with Hoyle Sauer, I will call her MRI result in between     Marcial Pacas, M.D. Ph.D.  [facility] [address] [place], [place] [address] [phone]

## 2013-09-12 ENCOUNTER — Encounter (HOSPITAL_COMMUNITY): Payer: Self-pay | Admitting: Emergency Medicine

## 2013-09-12 ENCOUNTER — Emergency Department (HOSPITAL_COMMUNITY)
Admission: EM | Admit: 2013-09-12 | Discharge: 2013-09-12 | Disposition: A | Discharge status: Home / Self Care | Payer: Medicaid Other | Attending: Emergency Medicine | Admitting: Emergency Medicine

## 2013-09-12 ENCOUNTER — Encounter (INDEPENDENT_AMBULATORY_CARE_PROVIDER_SITE_OTHER): Payer: Self-pay

## 2013-09-12 ENCOUNTER — Ambulatory Visit
Admission: RE | Admit: 2013-09-12 | Discharge: 2013-09-12 | Disposition: A | Discharge status: Home / Self Care | Payer: Medicaid Other | Source: Ambulatory Visit | Attending: Internal Medicine | Admitting: Internal Medicine

## 2013-09-12 ENCOUNTER — Telehealth: Payer: Self-pay | Admitting: Neurology

## 2013-09-12 ENCOUNTER — Telehealth: Payer: Self-pay

## 2013-09-12 DIAGNOSIS — R748 Abnormal levels of other serum enzymes: Secondary | ICD-10-CM

## 2013-09-12 DIAGNOSIS — R109 Unspecified abdominal pain: Secondary | ICD-10-CM

## 2013-09-12 DIAGNOSIS — G8918 Other acute postprocedural pain: Secondary | ICD-10-CM | POA: Insufficient documentation

## 2013-09-12 DIAGNOSIS — Z98811 Dental restoration status: Secondary | ICD-10-CM | POA: Insufficient documentation

## 2013-09-12 DIAGNOSIS — R1033 Periumbilical pain: Secondary | ICD-10-CM | POA: Insufficient documentation

## 2013-09-12 DIAGNOSIS — Z862 Personal history of diseases of the blood and blood-forming organs and certain disorders involving the immune mechanism: Secondary | ICD-10-CM | POA: Insufficient documentation

## 2013-09-12 DIAGNOSIS — Z3202 Encounter for pregnancy test, result negative: Secondary | ICD-10-CM | POA: Insufficient documentation

## 2013-09-12 DIAGNOSIS — Z9851 Tubal ligation status: Secondary | ICD-10-CM | POA: Insufficient documentation

## 2013-09-12 DIAGNOSIS — D249 Benign neoplasm of unspecified breast: Secondary | ICD-10-CM

## 2013-09-12 DIAGNOSIS — Z9889 Other specified postprocedural states: Secondary | ICD-10-CM | POA: Insufficient documentation

## 2013-09-12 DIAGNOSIS — F17200 Nicotine dependence, unspecified, uncomplicated: Secondary | ICD-10-CM | POA: Insufficient documentation

## 2013-09-12 LAB — CBC WITH DIFFERENTIAL/PLATELET
Basophils Absolute: 0 10*3/uL (ref 0.0–0.1)
Basophils Relative: 0 % (ref 0–1)
Eosinophils Absolute: 0.2 10*3/uL (ref 0.0–0.7)
Eosinophils Relative: 3 % (ref 0–5)
HCT: 39.8 % (ref 36.0–46.0)
HEMOGLOBIN: 13 g/dL (ref 12.0–15.0)
LYMPHS ABS: 1.9 10*3/uL (ref 0.7–4.0)
Lymphocytes Relative: 33 % (ref 12–46)
MCH: 27.7 pg (ref 26.0–34.0)
MCHC: 32.7 g/dL (ref 30.0–36.0)
MCV: 84.7 fL (ref 78.0–100.0)
MONOS PCT: 10 % (ref 3–12)
Monocytes Absolute: 0.6 10*3/uL (ref 0.1–1.0)
NEUTROS PCT: 54 % (ref 43–77)
Neutro Abs: 3.1 10*3/uL (ref 1.7–7.7)
Platelets: 291 10*3/uL (ref 150–400)
RBC: 4.7 MIL/uL (ref 3.87–5.11)
RDW: 13.5 % (ref 11.5–15.5)
WBC: 5.7 10*3/uL (ref 4.0–10.5)

## 2013-09-12 LAB — URINALYSIS, ROUTINE W REFLEX MICROSCOPIC
BILIRUBIN URINE: NEGATIVE
Glucose, UA: NEGATIVE mg/dL
HGB URINE DIPSTICK: NEGATIVE
KETONES UR: NEGATIVE mg/dL
NITRITE: NEGATIVE
PROTEIN: NEGATIVE mg/dL
Specific Gravity, Urine: 1.027 (ref 1.005–1.030)
UROBILINOGEN UA: 0.2 mg/dL (ref 0.0–1.0)
pH: 5 (ref 5.0–8.0)

## 2013-09-12 LAB — LIPASE, BLOOD: LIPASE: 19 U/L (ref 11–59)

## 2013-09-12 LAB — COMPREHENSIVE METABOLIC PANEL
ALK PHOS: 79 U/L (ref 39–117)
ALT: 45 U/L — ABNORMAL HIGH (ref 0–35)
AST: 40 U/L — ABNORMAL HIGH (ref 0–37)
Albumin: 4.1 g/dL (ref 3.5–5.2)
BUN: 14 mg/dL (ref 6–23)
CO2: 25 meq/L (ref 19–32)
Calcium: 9.7 mg/dL (ref 8.4–10.5)
Chloride: 103 mEq/L (ref 96–112)
Creatinine, Ser: 0.78 mg/dL (ref 0.50–1.10)
GLUCOSE: 113 mg/dL — AB (ref 70–99)
POTASSIUM: 4.3 meq/L (ref 3.7–5.3)
Sodium: 139 mEq/L (ref 137–147)
Total Bilirubin: 0.4 mg/dL (ref 0.3–1.2)
Total Protein: 7.8 g/dL (ref 6.0–8.3)

## 2013-09-12 LAB — URINE MICROSCOPIC-ADD ON

## 2013-09-12 LAB — PREGNANCY, URINE: Preg Test, Ur: NEGATIVE

## 2013-09-12 MED ORDER — ONDANSETRON HCL 4 MG/2ML IJ SOLN
4.0000 mg | Freq: Once | INTRAMUSCULAR | Status: AC
  Administered 2013-09-12: 4 mg via INTRAVENOUS
  Filled 2013-09-12: qty 2

## 2013-09-12 MED ORDER — HYDROMORPHONE HCL PF 1 MG/ML IJ SOLN
1.0000 mg | Freq: Once | INTRAMUSCULAR | Status: AC
  Administered 2013-09-12: 1 mg via INTRAVENOUS
  Filled 2013-09-12: qty 1

## 2013-09-12 MED ORDER — OXYCODONE-ACETAMINOPHEN 5-325 MG PO TABS: 1.0000 | ORAL_TABLET | ORAL | Status: DC | PRN

## 2013-09-12 MED ORDER — SODIUM CHLORIDE 0.9 % IV BOLUS (SEPSIS)
1000.0000 mL | Freq: Once | INTRAVENOUS | Status: AC
  Administered 2013-09-12: 1000 mL via INTRAVENOUS

## 2013-09-12 NOTE — ED Notes (Signed)
Had abd  surgery April 13 and cannot get any more pain meds w/out seeing a dr and she does not have PCP still having pain

## 2013-09-12 NOTE — Telephone Encounter (Signed)
(  When previous encounter was sent to me it was already closed) I called back and spoke with the patient.  She said she did get a Rx from Lucila Maine, PA-C today for Percocet, but claims the strength prescribed will not work, it needs to be stronger.  States she told the prescriber this, but they would not give her a higher dose.  Says we are "treating her for pain and we need to prescribe something to take her pain away because she's tired of it."  She wants a different pain med prescribed and says NSAIDS do not work.  She would also like a referral to a pain clinic if provider deems necessary.  Please advise.  Thank you.

## 2013-09-12 NOTE — Telephone Encounter (Signed)
Pt requesting medication refill. Please advise

## 2013-09-12 NOTE — Telephone Encounter (Signed)
Patient requesting refill of Percocet. Please call and advise patient.

## 2013-09-12 NOTE — ED Notes (Signed)
Gave pt water for fluid challenge 

## 2013-09-12 NOTE — Telephone Encounter (Signed)
It appears Lucila Maine, PA-C just prescribed this Rx today for #15 tabs.

## 2013-09-12 NOTE — ED Notes (Signed)
PA at bedside.

## 2013-09-12 NOTE — Telephone Encounter (Signed)
Pt requesting medication refill. Please advise °

## 2013-09-12 NOTE — Discharge Instructions (Signed)
Take percocet for severe pain - Please be careful with this medication.  It can cause drowsiness.  Use caution while driving, operating machinery, drinking alcohol, or any other activities that may impair your physical or mental abilities.   Follow-up with your surgeon as soon as possible  Return to the emergency department if you develop any changing/worsening condition, fever, repeated vomiting, worsening or changing pain, blood in your stool, or any other concerns (please read additional information regarding your condition below)   Abdominal Pain, Adult Many things can cause abdominal pain. Usually, abdominal pain is not caused by a disease and will improve without treatment. It can often be observed and treated at home. Your health care provider will do a physical exam and possibly order blood tests and X-rays to help determine the seriousness of your pain. However, in many cases, more time must pass before a clear cause of the pain can be found. Before that point, your health care provider may not know if you need more testing or further treatment. HOME CARE INSTRUCTIONS  Monitor your abdominal pain for any changes. The following actions may help to alleviate any discomfort you are experiencing:  Only take over-the-counter or prescription medicines as directed by your health care provider.  Do not take laxatives unless directed to do so by your health care provider.  Try a clear liquid diet (broth, tea, or water) as directed by your health care provider. Slowly move to a bland diet as tolerated. SEEK MEDICAL CARE IF:  You have unexplained abdominal pain.  You have abdominal pain associated with nausea or diarrhea.  You have pain when you urinate or have a bowel movement.  You experience abdominal pain that wakes you in the night.  You have abdominal pain that is worsened or improved by eating food.  You have abdominal pain that is worsened with eating fatty foods. SEEK IMMEDIATE  MEDICAL CARE IF:   Your pain does not go away within 2 hours.  You have a fever.  You keep throwing up (vomiting).  Your pain is felt only in portions of the abdomen, such as the right side or the left lower portion of the abdomen.  You pass bloody or black tarry stools. MAKE SURE YOU:  Understand these instructions.   Will watch your condition.   Will get help right away if you are not doing well or get worse.  Document Released: 01/26/2005 Document Revised: 02/06/2013 Document Reviewed: 12/26/2012 [facility] Patient Information 2014 [place].   Emergency Department Resource Guide 1) Find a Doctor and Pay Out of Pocket Although you won't have to find out who is covered by your insurance plan, it is a good idea to ask around and get recommendations. You will then need to call the office and see if the doctor you have chosen will accept you as a new patient and what types of options they offer for patients who are self-pay. Some doctors offer discounts or will set up payment plans for their patients who do not have insurance, but you will need to ask so you aren't surprised when you get to your appointment.  2) Contact Your Local Health Department Not all health departments have doctors that can see patients for sick visits, but many do, so it is worth a call to see if yours does. If you don't know where your local health department is, you can check in your phone book. The CDC also has a tool to help you locate your state's health  department, and many state websites also have listings of all of their local health departments.  3) Find a [facility] If your illness is not likely to be very severe or complicated, you may want to try a walk in clinic. These are popping up all over the country in pharmacies, drugstores, and shopping centers. They're usually staffed by nurse practitioners or physician assistants that have been trained to treat common illnesses and complaints.  They're usually fairly quick and inexpensive. However, if you have serious medical issues or chronic medical problems, these are probably not your best option.  No Primary Care Doctor: - Call Health Connect at  [phone] - they can help you locate a primary care doctor that  accepts your insurance, provides certain services, etc. - Physician Referral Service- [phone]  Chronic Pain Problems: Organization         Address  Phone   Notes  [facility]  [phone] Patients need to be referred by their primary care doctor.   Medication Assistance: Organization         Address  Phone   Notes  [facility] Medication [facility] [address] [place], [place] [address] [phone] --Must be a resident of [facility] -- Must have NO insurance coverage whatsoever (no Medicaid/ Medicare, etc.) -- The pt. MUST have a primary care doctor that directs their care regularly and follows them in the community   [facility]ist  [phone]   [employer]  [phone]    Agencies that provide inexpensive medical care: Organization         Address  Phone   Notes  [place]  [phone]   [facility]    [phone]   [facility] [place], [place] [address] [phone]   [place] [address], [place] [phone]   Planned Parenthood    [phone]   [facility]    [phone]   [place] [address], [place] Phone:  [phone], Fax:  [phone] Hours of Operation:  9 am - 6 pm, M-F.  Also accepts Medicaid/Medicare and self-pay.  [facility] for [place] [address], [address], [place] Phone: [phone], Fax: [phone]. Hours of Operation:  8:30 am - 5:30 pm, M-F.  Also accepts Medicaid and self-pay.  [facility] [address] [place]  Phone: [phone]   [place], [place], [place] [phone], Ext. [phone] Mondays & Thursdays: 7-9 AM.  First 15 patients are seen on a first come, first serve basis.    [place] Providers:  Organization         Address  Phone   Notes  [facility] [address],  [phone] Also accepts self-pay patients.  [facility] [address], [place]  [phone]   [place], [address], [place] [phone]   [facility] [address] [place] [phone]   Lucianne Lei [address], [place]   [phone] Only accepts [place] Access [place] patients after they have their name applied to their card.   Self-Pay (no insurance) in [facility]:  Organization  Address  Phone   Notes  Sickle Cell Patients, [facility] Internal Medicine [place] [phone]   [facility] [place] [phone]   [facility] [place]  [place], [address], [place] [phone]   [facility]/Dr. Osei-Bonsu  [address], [place] or [address], [place] [phone] Phone number for both [place] and [place] locations is the same.  [facility] [address], [place] [phone]   [facility] [address] [place] or [address] [phone]   [facility] [address], [place] [phone], phone; [phone], fax Sees patients 1st and 3rd Saturday of every month.  Must not qualify for public or private insurance (i.e. Medicaid, Medicare, [place] Health Choice, Veterans' Benefits)  Household income should be no more than 200% of the poverty level The clinic cannot  treat you if you are pregnant or think you are pregnant  Sexually transmitted diseases are not treated at the clinic.    Dental Care: Organization         Address  Phone  Notes  [facility] Department of [facility] [place] [phone] Accepts children up to age 30 who are enrolled in [place] or [place]; pregnant women with a Medicaid card; and children who have applied for Medicaid or [place] Health Choice, but were declined, whose parents can pay a reduced fee at time of service.  [facility] Department of [facility]  [address], [place] [phone] Accepts children up to age 17 who are enrolled in [place] or [place]; pregnant women with a Medicaid card; and children who have applied for Medicaid or [place] Health Choice, but were declined, whose parents can pay a reduced fee at time of service.  [place] Adult Dental Access PROGRAM  [place] [phone] Patients are seen by appointment only. Walk-ins are not accepted. [place] will see patients 76 years of age and older. Monday - Tuesday (8am-5pm) Most Wednesdays (8:30-5pm) $30 per visit, cash only  [facility] Adult Dental Access PROGRAM  [address], [facility] [phone] Patients are seen by appointment only. Walk-ins are not accepted. [place] will see patients 36 years of age and older. One Wednesday Evening (Monthly: Volunteer Based).  $30 per visit, cash only  [place]  [phone] for adults; Children under age 27, call Graduate Pediatric Dentistry at [phone]. Children aged 88-14, please call [phone] to request a pediatric application.  Dental services are provided in all areas of dental care including fillings, crowns and bridges, complete and partial dentures, implants, gum treatment, root canals, and extractions. Preventive care is also provided.  Treatment is provided to both adults and children. Patients are selected via a lottery and there is often a waiting list.   [facility] [address], [place]  [phone] [email or web address]   [facility] [address] [place], [place] [phone], Ext. [phone] Second and Fourth Thursday of each month, opens at 6:30 AM; Clinic ends at 9 AM.  Patients are seen on a first-come first-served basis, and a limited number are seen during each clinic.   [facility]  [address] [place], [place]  [place], [place] [phone]   Eligibility Requirements You must have lived in [place], [place], or [place] counties for at least the last three months.   You cannot be eligible for state or federal sponsored [employer], including [employer], [place], or [employer].   You generally cannot be eligible for healthcare insurance through your employer.    How to apply: Eligibility screenings are held every Tuesday and Wednesday afternoon from 1:00 pm until 4:00 pm. You do not need an appointment for the interview!  [facility] [address] [place], [place]   [place]  [place]  [place]  [phone]    Behavioral Health Resources in the Community: Intensive Outpatient Programs Organization         Address  Phone  Notes  Wellington Polk. [address] [place], [place] [phone]   [facility] [address], [place], [place]   ADS: [facility] [address] [place], [place]   [place] [address]  [place], [place] or [phone]   Substance Abuse Resources Organization         Address  Phone  Notes  [facility] Drug Services  [phone]   [place]   [phone]   [facility] [place]   Chinita Pester  [phone]   Residential & Outpatient Substance Abuse Program  [phone]   Psychological Services Organization         Address  Phone  Notes  [facility] [place]  [place]  [phone]   [place] [address], [place] or [phone]    Mobile Crisis Teams Organization         Address  Phone  Notes  [facility], Mobile Crisis Care Unit  [phone]   [facility]  [address] [place], [place]   [place] [address] [place] [phone]    Self-Help/Support Groups Organization         Address  Phone             Notes  [place]. of [place] - variety of support groups  [place] Call for more information  Narcotics Anonymous (NA), [facility] [address], [employer] [place]  2 meetings at this location   Special educational needs teacher         Address  Phone  Notes  ASAP Residential Treatment [place],    [place]  [phone]   [facility]  [address], [place] [id number], [place], [place]   [place], [place] [phone] Admissions: 8am-3pm M-F  Incentives Substance [place] [address]    [place], [place] [phone]   [facility] [address] Jadene Pierini [place], [place]   [facility] [address],  [place], [place]   [facility] - Intensive Outpatient [address] Kristeen [address], [place], [place]   [facility] ([place].) [place].,  [place], [place] or [phone]   [facility] (RTS) [address] [place], [place] Accepts Medicaid  [facility] [place] [address]  [place] [phone] Substance  Abuse/Addiction Treatment   [facility] Resources Organization  Address  Phone  Notes  °[facility]  [phone]   °Julie Brannon, PhD [address] [place], [place]   ([phone] or ([phone]   °[facility]   [address] °[place], [place] [phone]   °[facility] [address], [place], [place] [phone] Insurance/Medicaid/sponsorship through [facility]  °[facility] [address]                                    [place], [place] [phone] Therapy/tele-psych/case  °[facility] [address]  ° [place], [place] [phone]    °Dr. Arfeen  ([phone]   °[facility] [place]  [employer] [place] [facility] 1) [address], [place] °2) [address], [place] °3)  [address], [place] [phone] °([phone]) [phone] ° °([phone]) [phone]   °[place] Child Abuse Hotline ([phone]) [phone] or (336) [phone] (After Hours)    ° ° ° °

## 2013-09-12 NOTE — ED Provider Notes (Signed)
CSN: [id number]     Arrival date & time 09/12/13  1049 History   None    Chief Complaint  Patient presents with  . Abdominal Pain   HPI  Robin Pace is a 48 y.o. female with a PMH of anemia, headaches, and paresthesias who presents to the ED for evaluation of abdominal pain. History was provided by the patient. Patient had repeat of a diastasis recti/ventral hernia of the abdomen with Dr. Towanda Malkin on 08/12/13. She has had unchanged continuous pain since then. She describes a sharp and aching pain in her periumbilical region without radiation. Movement and palpation make it worse. She has had follow-up appointments with Dr. Towanda Malkin and he removed her drain. She called his office to get more pain medication because she ran out of her Percocet, which she has been taking since her surgery, but he states she needs to follow-up with her PCP. Patient has a follow-up appointment in mid-June. She has not had any pain medication in the past 2 days and came to the ED today because she "cannot take the pain." She has had decreased appetite. No emesis, diarrhea, constipation, dysuria, hematuria, vaginal bleeding/discharge, pelvic pain, fever, chills, chest pain, SOB, or other concerns. She has been wearing her abdominal binder but it is not improving her symptoms.    Past Medical History  Diagnosis Date  . Anemia   . Medical history non-contributory   . Wears dentures     top  . Headache   . Numbness on right side    Past Surgical History  Procedure Laterality Date  . Tubal ligation    . Cesarean section    . Incise and drain abcess  2005    rt middle finger   . Mass excision Right 08/06/2012    Procedure: EXCISION OF LARGE MASS right FLANK WITH LIPO ASSISTANCE;  Surgeon: Cristine Polio, MD;  Location: [place];  Service: Plastics;  Laterality: Right;  . 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 Hypertension Mother   . Heart disease Father   . Heart disease Brother    History  Substance Use Topics  . Smoking status: Current Every Day Smoker -- 0.50 packs/day  . Smokeless tobacco: Never Used  . Alcohol Use: Yes     Comment: occassional   OB History   Grav Para Term Preterm Abortions TAB SAB Ect Mult Living                  Review of Systems  Constitutional: Positive for appetite change. Negative for fever, chills, activity change and fatigue.  Respiratory: Negative for cough and shortness of breath.   Cardiovascular: Negative for chest pain and leg swelling.  Gastrointestinal: Positive for abdominal pain. Negative for nausea, vomiting, diarrhea and constipation.  Genitourinary: Negative for dysuria, decreased urine volume, vaginal bleeding, vaginal discharge, vaginal pain and pelvic pain.  Musculoskeletal: Negative for back pain.  Neurological: Negative for dizziness, weakness, light-headedness and headaches.    Allergies  Review of patient's allergies indicates no known allergies.  Home Medications   Prior to Admission medications   Medication Sig Start Date End Date Taking? Authorizing Provider  oxyCODONE-acetaminophen (PERCOCET) 7.5-325 MG per tablet Take 1 tablet by mouth every 4 (four) hours as needed for pain.  Yes Historical Provider, MD  rizatriptan (MAXALT-MLT) 5 MG disintegrating tablet Take 5 mg by mouth as needed for migraine. May repeat in 2 hours if needed   Yes Historical Provider, MD   BP 120/95  Pulse 95  Temp(Src) 97.9 F (36.6 C) (Oral)  Resp 16  SpO2 98%  Filed Vitals:   09/12/13 1103 09/12/13 1346 09/12/13 1415  BP: 120/95 120/56 145/89  Pulse: 95 77 97  Temp: 97.9 F (36.6 C) 98.5 F (36.9 C)   TempSrc: Oral Oral   Resp: 16 18 18   SpO2: 98% 100% 98%    Physical Exam  Nursing note and  vitals reviewed. Constitutional: She is oriented to person, place, and time. She appears well-developed and well-nourished. No distress.  HENT:  Head: Normocephalic and atraumatic.  Right Ear: External ear normal.  Left Ear: External ear normal.  Nose: Nose normal.  Mouth/Throat: Oropharynx is clear and moist. No oropharyngeal exudate.  Eyes: Conjunctivae are normal. Right eye exhibits no discharge. Left eye exhibits no discharge.  Neck: Normal range of motion. Neck supple.  Cardiovascular: Normal rate, regular rhythm, normal heart sounds and intact distal pulses.  Exam reveals no gallop and no friction rub.   No murmur heard. Pulmonary/Chest: Effort normal and breath sounds normal. No respiratory distress. She has no wheezes. She has no rales. She exhibits no tenderness.  Abdominal: Soft. Bowel sounds are normal. She exhibits no distension and no mass. There is tenderness. There is no rebound and no guarding.  Periumbilical tenderness to palpation.   Musculoskeletal: Normal range of motion. She exhibits no edema and no tenderness.  Neurological: She is alert and oriented to person, place, and time.  Skin: Skin is warm and dry. She is not diaphoretic.  Horizonal surgical scar present in the periumbilical region with no drainage, surrounding edema/erythema, or dehiscence.      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 She is due for bilateral mammography today.  EXAM: DIGITAL DIAGNOSTIC  bilateral MAMMOGRAM WITH CAD  COMPARISON:  Prior exams  ACR Breast Density Category c: The breast tissue is heterogeneously dense, which may obscure small masses.  FINDINGS: The oval circumscribed mass in the left breast 10 o'clock location is mammographically stable over  multiple prior exams, compatible with a benign finding. No new suspicious finding is seen in either breast.  Mammographic images were processed with CAD.  IMPRESSION: No evidence for malignancy in either breast. No further specific follow up is needed for the benign-appearing left breast mass 10 o'clock location.  RECOMMENDATION: Screening mammogram in one year.(Code:SM-B-01Y)  I have discussed the findings and recommendations with the patient. Results were also provided in writing at the conclusion of the visit. If applicable, a reminder letter will be sent to the patient regarding the next appointment.  BI-RADS CATEGORY  2: Benign Finding(s)   Electronically Signed   By: Conchita Paris M.D.   On: 09/12/2013 10:38     EKG Interpretation None      Results for orders placed during the hospital encounter of 09/12/13  CBC WITH DIFFERENTIAL      Result Value Ref Range   WBC 5.7  4.0 - 10.5 K/uL   RBC 4.70  3.87 - 5.11 MIL/uL   Hemoglobin 13.0  12.0 -  15.0 g/dL   HCT 39.8  36.0 - 46.0 %   MCV 84.7  78.0 - 100.0 fL   MCH 27.7  26.0 - 34.0 pg   MCHC 32.7  30.0 - 36.0 g/dL   RDW 13.5  11.5 - 15.5 %   Platelets 291  150 - 400 K/uL   Neutrophils Relative % 54  43 - 77 %   Neutro Abs 3.1  1.7 - 7.7 K/uL   Lymphocytes Relative 33  12 - 46 %   Lymphs Abs 1.9  0.7 - 4.0 K/uL   Monocytes Relative 10  3 - 12 %   Monocytes Absolute 0.6  0.1 - 1.0 K/uL   Eosinophils Relative 3  0 - 5 %   Eosinophils Absolute 0.2  0.0 - 0.7 K/uL   Basophils Relative 0  0 - 1 %   Basophils Absolute 0.0  0.0 - 0.1 K/uL  COMPREHENSIVE METABOLIC PANEL      Result Value Ref Range   Sodium 139  137 - 147 mEq/L   Potassium 4.3  3.7 - 5.3 mEq/L   Chloride 103  96 - 112 mEq/L   CO2 25  19 - 32 mEq/L   Glucose, Bld 113 (*) 70 - 99 mg/dL   BUN 14  6 - 23 mg/dL   Creatinine, Ser 0.78  0.50 - 1.10 mg/dL   Calcium 9.7  8.4 - 10.5 mg/dL   Total Protein 7.8  6.0 - 8.3 g/dL   Albumin 4.1  3.5 - 5.2 g/dL   AST 40 (*) 0 - 37  U/L   ALT 45 (*) 0 - 35 U/L   Alkaline Phosphatase 79  39 - 117 U/L   Total Bilirubin 0.4  0.3 - 1.2 mg/dL   GFR calc non Af Amer >90  >90 mL/min   GFR calc Af Amer >90  >90 mL/min  LIPASE, BLOOD      Result Value Ref Range   Lipase 19  11 - 59 U/L  URINALYSIS, ROUTINE W REFLEX MICROSCOPIC      Result Value Ref Range   Color, Urine YELLOW  YELLOW   APPearance CLOUDY (*) CLEAR   Specific Gravity, Urine 1.027  1.005 - 1.030   pH 5.0  5.0 - 8.0   Glucose, UA NEGATIVE  NEGATIVE mg/dL   Hgb urine dipstick NEGATIVE  NEGATIVE   Bilirubin Urine NEGATIVE  NEGATIVE   Ketones, ur NEGATIVE  NEGATIVE mg/dL   Protein, ur NEGATIVE  NEGATIVE mg/dL   Urobilinogen, UA 0.2  0.0 - 1.0 mg/dL   Nitrite NEGATIVE  NEGATIVE   Leukocytes, UA SMALL (*) NEGATIVE  PREGNANCY, URINE      Result Value Ref Range   Preg Test, Ur NEGATIVE  NEGATIVE  URINE MICROSCOPIC-ADD ON      Result Value Ref Range   Squamous Epithelial / LPF MANY (*) RARE   WBC, UA 3-6  <3 WBC/hpf   RBC / HPF 0-2  <3 RBC/hpf   Bacteria, UA MANY (*) RARE     MDM   Allissa Albright Chevere is a 48 y.o. female with a PMH of anemia, headaches, and paresthesias who presents to the ED for evaluation of abdominal pain, which is unchanged from her surgery in 08/12/13. Patient afebrile and non-toxic in appearance. No leukocytosis. Doubt abdominal abscess or other infectious causes of pain. Patient able to tolerate PO challenge without difficulty or emesis. Vital signs stable. Mildly elevated liver enzymes (AST 40 and ALT 45). UA not  highly suggestive of a UTI but did have many bacteria. Will send for culture. Patient has no dysuria. Labs otherwise unremarkable. Patient to follow-up with her surgeon and PCP. Return precautions, discharge instructions, and follow-up was discussed with the patient before discharge.    Rechecks  1:40 PM = pain improved to a 4/10. Tolerating PO fluids without difficulty.  2:15 PM = Pain improved to a 2/10. States feels well  enough for discharge. Repeat abdominal exam benign.    New Prescriptions   OXYCODONE-ACETAMINOPHEN (PERCOCET/ROXICET) 5-325 MG PER TABLET    Take 1-2 tablets by mouth every 4 (four) hours as needed for severe pain.     Final impressions: 1. Abdominal pain   2. Post-op pain   3. Elevated liver enzymes      Mercy Moore PA-C   This patient was discussed with Dr. Sheffield Slider, PA-C 09/12/13 1525

## 2013-09-14 LAB — URINE CULTURE

## 2013-09-16 ENCOUNTER — Telehealth: Payer: Self-pay | Admitting: Neurology

## 2013-09-16 NOTE — ED Provider Notes (Signed)
Medical screening examination/treatment/procedure(s) were conducted as a shared visit with non-physician practitioner(s) and myself.  I personally evaluated the patient during the encounter.   EKG Interpretation None      Pt c/o chronic abdominal pain, states recently ran out of pain medication.  No vomiting. Having normal bms. No abd distension. No fevers. abd soft nt.   Mirna Mires, MD 09/16/13 [phone]

## 2013-09-17 NOTE — Telephone Encounter (Signed)
I have done peer to peer review,   MRI cervical spine is approved, [id number]

## 2013-09-20 ENCOUNTER — Ambulatory Visit
Admission: RE | Admit: 2013-09-20 | Discharge: 2013-09-20 | Disposition: A | Discharge status: Home / Self Care | Payer: Medicaid Other | Source: Ambulatory Visit | Attending: Neurology | Admitting: Neurology

## 2013-09-20 DIAGNOSIS — R51 Headache: Secondary | ICD-10-CM

## 2013-09-20 DIAGNOSIS — R519 Headache, unspecified: Secondary | ICD-10-CM

## 2013-09-20 DIAGNOSIS — R269 Unspecified abnormalities of gait and mobility: Secondary | ICD-10-CM

## 2013-09-20 DIAGNOSIS — M545 Low back pain, unspecified: Secondary | ICD-10-CM

## 2013-09-26 NOTE — Progress Notes (Signed)
Quick Note:  Shared normal MRI brain results with patient, verbalized understanding ______

## 2013-10-09 ENCOUNTER — Encounter (HOSPITAL_COMMUNITY): Payer: Self-pay | Admitting: Emergency Medicine

## 2013-10-09 ENCOUNTER — Emergency Department (HOSPITAL_COMMUNITY)
Admission: EM | Admit: 2013-10-09 | Discharge: 2013-10-09 | Discharge status: Against Medical Advice | Payer: Medicaid Other | Attending: Emergency Medicine | Admitting: Emergency Medicine

## 2013-10-09 DIAGNOSIS — G43909 Migraine, unspecified, not intractable, without status migrainosus: Secondary | ICD-10-CM | POA: Insufficient documentation

## 2013-10-09 DIAGNOSIS — F17200 Nicotine dependence, unspecified, uncomplicated: Secondary | ICD-10-CM | POA: Insufficient documentation

## 2013-10-09 DIAGNOSIS — K469 Unspecified abdominal hernia without obstruction or gangrene: Secondary | ICD-10-CM | POA: Insufficient documentation

## 2013-10-09 NOTE — ED Notes (Signed)
Per pt sts she is still having migraine even after taking pain meds. sts also she is having pain where her hernia surgery is.

## 2013-10-10 ENCOUNTER — Telehealth: Payer: Self-pay | Admitting: *Deleted

## 2013-10-12 ENCOUNTER — Telehealth: Payer: Self-pay | Admitting: Neurology

## 2013-10-12 NOTE — Telephone Encounter (Signed)
The patient called wanting the results of the brain and cervical spine MRI. These studies were normal. I discussed this with her.

## 2013-10-14 ENCOUNTER — Telehealth: Payer: Self-pay | Admitting: Neurology

## 2013-10-14 NOTE — Telephone Encounter (Signed)
Please call patient for normal MRI of the brain, cervical

## 2013-10-14 NOTE — Telephone Encounter (Signed)
Message copied by Marcial Pacas on Mon Oct 14, 2013  2:04 PM ------      Message from: Jenita Seashore S      Created: Mon Sep 16, 2013  4:08 PM      Regarding: CARE REVIEW       Blakleigh Straw needs a peer to peer review for the mri cervical spine.  I have approval for the brain.  Case # [id number].            [id number]        ------

## 2013-10-14 NOTE — Telephone Encounter (Signed)
Called pt and left message stating per Dr. Krista Blue that the pt's MRI of the brain and cervical were normal and if the pt has any other problems, questions or concerns to call the office.

## 2013-10-14 NOTE — Telephone Encounter (Signed)
Pt calling requesting MRI results of the cervical spine. Please advise

## 2013-10-14 NOTE — Telephone Encounter (Signed)
Patient received message regarding MRI results were negative, but she still experiencing Migraines, can't turn her head to the right its hurts.  She feels constant pain in neck everyday.  Pain on right side of the body.  Please call and advise.

## 2013-10-15 NOTE — Telephone Encounter (Signed)
Pt is calling stating that she knows her MRI was normal, but pt is still having a lot of pain on the right side of her body and still having migraines, can not turn her head without pain and pt's neck is in constant pain everyday. Please advise

## 2013-10-16 NOTE — Telephone Encounter (Signed)
Cathy please move up her follow up appt with Hoyle Sauer in her next available to address her issues.

## 2013-10-16 NOTE — Telephone Encounter (Signed)
Spoke to patient and she is scheduled 10-21-13 to see cm per Dr. Krista Blue note.

## 2013-10-21 ENCOUNTER — Ambulatory Visit: Payer: Self-pay | Admitting: Nurse Practitioner

## 2013-10-21 ENCOUNTER — Telehealth: Payer: Self-pay | Admitting: Nurse Practitioner

## 2013-10-21 NOTE — Telephone Encounter (Signed)
No show for scheduled appt 

## 2013-11-08 ENCOUNTER — Telehealth: Payer: Self-pay | Admitting: *Deleted

## 2013-11-08 NOTE — Telephone Encounter (Signed)
Patient is requesting to be seen today for pain, left vmessage to call back with details of pain

## 2013-11-08 NOTE — Telephone Encounter (Signed)
No show appt with Hoyle Sauer in June 2015, please try to schedule a follow up appt again to NP next available.

## 2013-11-11 NOTE — Telephone Encounter (Signed)
Called pt to schedule an appt with Hoyle Sauer, NP on 11/13/13, per Dr. Rhea Belton phone note on 11/08/13. I advised the pt the if she has any other problems, questions or concerns to call the office. Pt verbalized understanding.

## 2013-11-13 ENCOUNTER — Ambulatory Visit: Payer: Self-pay | Admitting: Nurse Practitioner

## 2013-11-13 ENCOUNTER — Telehealth: Payer: Self-pay | Admitting: Nurse Practitioner

## 2013-11-13 NOTE — Telephone Encounter (Signed)
No show for scheduled appt 

## 2013-11-14 ENCOUNTER — Telehealth: Payer: Self-pay | Admitting: Nurse Practitioner

## 2013-11-14 NOTE — Telephone Encounter (Signed)
Patient requesting earlier appointment than what is scheduled in August. She was late for her appointment yesterday, rescheduled for the afternoon and did not show. She states she is having severe pain on the right side of her body. Please call to advise.

## 2013-11-15 NOTE — Telephone Encounter (Signed)
This patient has been dismissed from the practice due to multiple no shows. Closing encounter.

## 2013-11-19 ENCOUNTER — Encounter: Payer: Self-pay | Admitting: Neurology

## 2013-11-30 ENCOUNTER — Emergency Department (HOSPITAL_COMMUNITY): Payer: Medicaid Other

## 2013-11-30 ENCOUNTER — Emergency Department (HOSPITAL_COMMUNITY)
Admission: EM | Admit: 2013-11-30 | Discharge: 2013-11-30 | Disposition: A | Discharge status: Home / Self Care | Payer: Medicaid Other | Attending: Emergency Medicine | Admitting: Emergency Medicine

## 2013-11-30 ENCOUNTER — Encounter (HOSPITAL_COMMUNITY): Payer: Self-pay | Admitting: Emergency Medicine

## 2013-11-30 DIAGNOSIS — R52 Pain, unspecified: Secondary | ICD-10-CM | POA: Diagnosis not present

## 2013-11-30 DIAGNOSIS — Z98811 Dental restoration status: Secondary | ICD-10-CM | POA: Diagnosis not present

## 2013-11-30 DIAGNOSIS — Z79899 Other long term (current) drug therapy: Secondary | ICD-10-CM | POA: Insufficient documentation

## 2013-11-30 DIAGNOSIS — F17200 Nicotine dependence, unspecified, uncomplicated: Secondary | ICD-10-CM | POA: Insufficient documentation

## 2013-11-30 DIAGNOSIS — Z862 Personal history of diseases of the blood and blood-forming organs and certain disorders involving the immune mechanism: Secondary | ICD-10-CM | POA: Diagnosis not present

## 2013-11-30 DIAGNOSIS — R51 Headache: Secondary | ICD-10-CM | POA: Diagnosis present

## 2013-11-30 NOTE — Discharge Instructions (Signed)
Pain of Unknown Etiology (Pain Without a Known Cause) °You have come to your caregiver because of pain. Pain can occur in any part of the body. Often there is not a definite cause. If your laboratory (blood or urine) work was normal and X-rays or other studies were normal, your caregiver may treat you without knowing the cause of the pain. An example of this is the headache. Most headaches are diagnosed by taking a history. This means your caregiver asks you questions about your headaches. Your caregiver determines a treatment based on your answers. Usually testing done for headaches is normal. Often testing is not done unless there is no response to medications. Regardless of where your pain is located today, you can be given medications to make you comfortable. If no physical cause of pain can be found, most cases of pain will gradually leave as suddenly as they came.  °If you have a painful condition and no reason can be found for the pain, it is important that you follow up with your caregiver. If the pain becomes worse or does not go away, it may be necessary to repeat tests and look further for a possible cause. °· Only take over-the-counter or prescription medicines for pain, discomfort, or fever as directed by your caregiver. °· For the protection of your privacy, test results cannot be given over the phone. Make sure you receive the results of your test. Ask how these results are to be obtained if you have not been informed. It is your responsibility to obtain your test results. °· You may continue all activities unless the activities cause more pain. When the pain lessens, it is important to gradually resume normal activities. Resume activities by beginning slowly and gradually increasing the intensity and duration of the activities or exercise. During periods of severe pain, bed rest may be helpful. Lie or sit in any position that is comfortable. °· Ice used for acute (sudden) conditions may be effective.  Use a large plastic bag filled with ice and wrapped in a towel. This may provide pain relief. °· See your caregiver for continued problems. Your caregiver can help or refer you for exercises or physical therapy if necessary. °If you were given medications for your condition, do not drive, operate machinery or power tools, or sign legal documents for 24 hours. Do not drink alcohol, take sleeping pills, or take other medications that may interfere with treatment. °See your caregiver immediately if you have pain that is becoming worse and not relieved by medications. °Document Released: 01/11/2001 Document Revised: 02/06/2013 Document Reviewed: 04/18/2005 °ExitCare® Patient Information ©2015 [employer]. This information is not intended to replace advice given to you by your health care provider. Make sure you discuss any questions you have with your health care provider. ° °

## 2013-11-30 NOTE — ED Notes (Signed)
Patient c/o R sided numbness that is intermittent ongoing since April 2015. Patient is seeing a neurologist and has had multiplt imaging studies (per patient). Patient also c/o a "knot" to her throat, at the clavicle that has also been there since April. Patient states this knot came up after hernia repair in April. Patient also c/o knot to her upper mid chest, states this appeared yesterday. Knot to upper mid chest is slightly purple in color and raised, denies injury. Patient is alert and oriented, NAD noted. Able to speak in complete sentences, ambulatory without difficulty.

## 2013-11-30 NOTE — ED Provider Notes (Signed)
CSN: [id number]     Arrival date & time 11/30/13  1915 History   First MD Initiated Contact with Patient 11/30/13 2110     Chief Complaint  Patient presents with  . Numbness    intermittent right sided, hx of same, seeing neurologist and has been evaluated  . Migraine  . knot to central chest      (Consider location/radiation/quality/duration/timing/severity/associated sxs/prior Treatment) Patient is a 48 y.o. female presenting with general illness.  Illness Location:  Right upper chest Quality:  Pt reports a knot on her chest near her right clavicle Severity:  Moderate Onset quality:  Sudden Duration: first noticed today. Timing:  Constant Progression:  Unchanged Chronicity:  New Context:  Pt reports multiple chronic symptoms including daily headaches, constant waxing and waning right sided body pain/numbness/weakness Relieved by:  Nothing Worsened by:  Nothing Associated symptoms: no fever     Past Medical History  Diagnosis Date  . Anemia   . Medical history non-contributory   . Wears dentures     top  . Headache   . Numbness on right side    Past Surgical History  Procedure Laterality Date  . Tubal ligation    . Cesarean section    . Incise and drain abcess  2005    rt middle finger   . Mass excision Right 08/06/2012    Procedure: EXCISION OF LARGE MASS right FLANK WITH LIPO ASSISTANCE;  Surgeon: Cristine Polio, MD;  Location: [place];  Service: Plastics;  Laterality: Right;  . 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 Hernia repair     Family History  Problem Relation Age of Onset  . Hypertension Mother   . Heart disease Father   . Heart disease Brother    History  Substance Use Topics  . Smoking status: Current Every Day Smoker -- 0.20 packs/day    Types: Cigarettes   . Smokeless tobacco: Never Used  . Alcohol Use: Yes     Comment: occassional   OB History   Grav Para Term Preterm Abortions TAB SAB Ect Mult Living                 Review of Systems  Constitutional: Negative for fever.  All other systems reviewed and are negative.     Allergies  Review of patient's allergies indicates no known allergies.  Home Medications   Prior to Admission medications   Medication Sig Start Date End Date Taking? Authorizing Provider  HYDROcodone-acetaminophen (NORCO) 10-325 MG per tablet Take 1 tablet by mouth every 6 (six) hours as needed.   Yes Historical Provider, MD  meclizine (ANTIVERT) 25 MG tablet Take 25 mg by mouth 3 (three) times daily as needed for dizziness (dizziness).   Yes Historical Provider, MD  nortriptyline (PAMELOR) 25 MG capsule Take 25 mg by mouth at bedtime.   Yes Historical Provider, MD  oxyCODONE-acetaminophen (PERCOCET) 7.5-325 MG per tablet Take 1 tablet by mouth every 4 (four) hours as needed for pain.   Yes Historical Provider, MD  rizatriptan (MAXALT-MLT) 5 MG disintegrating tablet Take 5 mg by mouth as needed for migraine. May repeat in 2 hours if needed   Yes Historical Provider, MD  topiramate (TOPAMAX) 25 MG tablet Take 25 mg by mouth 2 (two) times daily.   Yes Historical Provider, MD   BP  132/92  Pulse 94  Temp(Src) 97.8 F (36.6 C) (Oral)  Resp 18  SpO2 100% Physical Exam  Nursing note and vitals reviewed. Constitutional: She is oriented to person, place, and time. She appears well-developed and well-nourished. No distress.  HENT:  Head: Normocephalic and atraumatic.  Mouth/Throat: Oropharynx is clear and moist.  Eyes: Conjunctivae are normal. Pupils are equal, round, and reactive to light. No scleral icterus.  Neck: Neck supple.  Cardiovascular: Normal rate, regular rhythm, normal heart sounds and intact distal pulses.   No murmur heard. Pulmonary/Chest: Effort normal and breath sounds normal. No stridor. No  respiratory distress. She has no rales.  Abdominal: Soft. Bowel sounds are normal. She exhibits no distension. There is no tenderness.  Musculoskeletal: Normal range of motion.  Neurological: She is alert and oriented to person, place, and time. No cranial nerve deficit or sensory deficit.  Motor exam limited by patient effort.  When distracted, strength is intact.   Skin: Skin is warm and dry. No rash noted.  Psychiatric: She has a normal mood and affect. Her behavior is normal.    ED Course  Procedures (including critical care time) Labs Review Labs Reviewed - No data to display  Imaging Review Dg Clavicle Right  11/30/2013   CLINICAL DATA:  Numbness radiating down the right arm. Knot at the medial clavicle has been there since April.  EXAM: RIGHT CLAVICLE - 2+ VIEWS  COMPARISON:  Chest 07/13/2013  FINDINGS: There is no evidence of fracture or other focal bone lesions. Soft tissues are unremarkable.  IMPRESSION: Negative.   Electronically Signed   By: Lucienne Capers M.D.   On: 11/30/2013 23:11  All radiology studies independently viewed by me.      EKG Interpretation None      MDM   Final diagnoses:  Pain of right side of body    48 yo female with multiple complaints including her chronic headaches and right sided body pain/numbness/weakness.  However, her actual reason for coming to the ED tonight was that she felt that she had a mass at her right medial clavicle.  Her clavicular and chest exams were unremarkable.  I did not appreciate a mass or bony deformity.  I think she can have this concern followed up outpatient.   Regarding her other chronic symptoms, she has had extensive workup including MR brain and MR C spine, both of which were negative.  She can continue to follow these up with her outpatient providers.     Houston Siren III, MD 11/30/13 [phone]

## 2013-11-30 NOTE — ED Notes (Signed)
Right side fist and grip are weak

## 2013-11-30 NOTE — ED Notes (Signed)
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    Pt is alert and oriented in NAD,  She does have on sunglasses due to light sensitivity.   States also she has a bone sticking out further then her others on front of her neck and weakness and pain in right arm

## 2013-12-26 ENCOUNTER — Ambulatory Visit: Payer: Medicaid Other | Admitting: Nurse Practitioner

## 2014-01-30 ENCOUNTER — Encounter (HOSPITAL_COMMUNITY): Payer: Self-pay | Admitting: Emergency Medicine

## 2014-01-30 ENCOUNTER — Emergency Department (HOSPITAL_COMMUNITY)
Admission: EM | Admit: 2014-01-30 | Discharge: 2014-01-30 | Disposition: A | Discharge status: Home / Self Care | Payer: Medicaid Other | Attending: Emergency Medicine | Admitting: Emergency Medicine

## 2014-01-30 DIAGNOSIS — Z87898 Personal history of other specified conditions: Secondary | ICD-10-CM | POA: Insufficient documentation

## 2014-01-30 DIAGNOSIS — Z79899 Other long term (current) drug therapy: Secondary | ICD-10-CM | POA: Diagnosis not present

## 2014-01-30 DIAGNOSIS — Z862 Personal history of diseases of the blood and blood-forming organs and certain disorders involving the immune mechanism: Secondary | ICD-10-CM | POA: Diagnosis not present

## 2014-01-30 DIAGNOSIS — R52 Pain, unspecified: Secondary | ICD-10-CM | POA: Diagnosis present

## 2014-01-30 DIAGNOSIS — G43909 Migraine, unspecified, not intractable, without status migrainosus: Secondary | ICD-10-CM | POA: Diagnosis not present

## 2014-01-30 DIAGNOSIS — Z9889 Other specified postprocedural states: Secondary | ICD-10-CM | POA: Insufficient documentation

## 2014-01-30 DIAGNOSIS — M797 Fibromyalgia: Secondary | ICD-10-CM | POA: Insufficient documentation

## 2014-01-30 DIAGNOSIS — Z98811 Dental restoration status: Secondary | ICD-10-CM | POA: Insufficient documentation

## 2014-01-30 DIAGNOSIS — Z720 Tobacco use: Secondary | ICD-10-CM | POA: Diagnosis not present

## 2014-01-30 DIAGNOSIS — Z9851 Tubal ligation status: Secondary | ICD-10-CM | POA: Diagnosis not present

## 2014-01-30 DIAGNOSIS — G43809 Other migraine, not intractable, without status migrainosus: Secondary | ICD-10-CM

## 2014-01-30 HISTORY — DX: Fibromyalgia: M79.7

## 2014-01-30 LAB — I-STAT CHEM 8, ED
BUN: 16 mg/dL (ref 6–23)
CHLORIDE: 105 meq/L (ref 96–112)
Calcium, Ion: 1.16 mmol/L (ref 1.12–1.23)
Creatinine, Ser: 0.8 mg/dL (ref 0.50–1.10)
GLUCOSE: 116 mg/dL — AB (ref 70–99)
HCT: 44 % (ref 36.0–46.0)
Hemoglobin: 15 g/dL (ref 12.0–15.0)
POTASSIUM: 4.1 meq/L (ref 3.7–5.3)
SODIUM: 139 meq/L (ref 137–147)
TCO2: 27 mmol/L (ref 0–100)

## 2014-01-30 MED ORDER — PROCHLORPERAZINE EDISYLATE 5 MG/ML IJ SOLN
10.0000 mg | Freq: Four times a day (QID) | INTRAMUSCULAR | Status: DC | PRN
  Administered 2014-01-30: 10 mg via INTRAVENOUS
  Filled 2014-01-30: qty 2

## 2014-01-30 MED ORDER — IBUPROFEN 200 MG PO TABS
400.0000 mg | ORAL_TABLET | Freq: Once | ORAL | Status: AC
  Administered 2014-01-30: 400 mg via ORAL
  Filled 2014-01-30: qty 2

## 2014-01-30 MED ORDER — PROMETHAZINE HCL 25 MG/ML IJ SOLN
12.5000 mg | Freq: Once | INTRAMUSCULAR | Status: AC
  Administered 2014-01-30: 12.5 mg via INTRAVENOUS
  Filled 2014-01-30: qty 1

## 2014-01-30 MED ORDER — TRAMADOL HCL 50 MG PO TABS
50.0000 mg | ORAL_TABLET | Freq: Once | ORAL | Status: AC
  Administered 2014-01-30: 50 mg via ORAL
  Filled 2014-01-30: qty 1

## 2014-01-30 MED ORDER — SODIUM CHLORIDE 0.9 % IV BOLUS (SEPSIS)
1000.0000 mL | Freq: Once | INTRAVENOUS | Status: AC
  Administered 2014-01-30: 1000 mL via INTRAVENOUS

## 2014-01-30 NOTE — ED Notes (Addendum)
Pt reports R side pain from "head to toes" since April. Head pain feels slightly worse than rest of body. Leg pain aggravated by standing and arm pain by movement. Pt reports she has been diagnosed with fibromyalgia and migraines. Pt comes here because pain in general has gotten worse. Pt states she threw up earlier today

## 2014-01-30 NOTE — Discharge Instructions (Signed)
Fibromyalgia Fibromyalgia is a disorder that is often misunderstood. It is associated with muscular pains and tenderness that comes and goes. It is often associated with fatigue and sleep disturbances. Though it tends to be long-lasting, fibromyalgia is not life-threatening. CAUSES  The exact cause of fibromyalgia is unknown. People with certain gene types are predisposed to developing fibromyalgia and other conditions. Certain factors can play a role as triggers, such as:  Spine disorders.  Arthritis.  Severe injury (trauma) and other physical stressors.  Emotional stressors. SYMPTOMS   The main symptom is pain and stiffness in the muscles and joints, which can vary over time.  Sleep and fatigue problems. Other related symptoms may include:  Bowel and bladder problems.  Headaches.  Visual problems.  Problems with odors and noises.  Depression or mood changes.  Painful periods (dysmenorrhea).  Dryness of the skin or eyes. DIAGNOSIS  There are no specific tests for diagnosing fibromyalgia. Patients can be diagnosed accurately from the specific symptoms they have. The diagnosis is made by determining that nothing else is causing the problems. TREATMENT  There is no cure. Management includes medicines and an active, healthy lifestyle. The goal is to enhance physical fitness, decrease pain, and improve sleep. HOME CARE INSTRUCTIONS   Only take over-the-counter or prescription medicines as directed by your caregiver. Sleeping pills, tranquilizers, and pain medicines may make your problems worse.  Low-impact aerobic exercise is very important and advised for treatment. At first, it may seem to make pain worse. Gradually increasing your tolerance will overcome this feeling.  Learning relaxation techniques and how to control stress will help you. Biofeedback, visual imagery, hypnosis, muscle relaxation, yoga, and meditation are all options.  Anti-inflammatory medicines and  physical therapy may provide short-term help.  Acupuncture or massage treatments may help.  Take muscle relaxant medicines as suggested by your caregiver.  Avoid stressful situations.  Plan a healthy lifestyle. This includes your diet, sleep, rest, exercise, and friends.  Find and practice a hobby you enjoy.  Join a fibromyalgia support group for interaction, ideas, and sharing advice. This may be helpful. SEEK MEDICAL CARE IF:  You are not having good results or improvement from your treatment. FOR MORE INFORMATION  National Fibromyalgia Association: www.fmaware.[place]: www.arthritis.org Document Released: 04/18/2005 Document Revised: 07/11/2011 Document Reviewed: 07/29/2009 [facility] Patient Information 2015 [place], [place]. This information is not intended to replace advice given to you by your health care provider. Make sure you discuss any questions you have with your health care provider.  Migraine Headache A migraine headache is an intense, throbbing pain on one or both sides of your head. A migraine can last for 30 minutes to several hours. CAUSES  The exact cause of a migraine headache is not always known. However, a migraine may be caused when nerves in the brain become irritated and release chemicals that cause inflammation. This causes pain. Certain things may also trigger migraines, such as:  Alcohol.  Smoking.  Stress.  Menstruation.  Aged cheeses.  Foods or drinks that contain nitrates, glutamate, aspartame, or tyramine.  Lack of sleep.  Chocolate.  Caffeine.  Hunger.  Physical exertion.  Fatigue.  Medicines used to treat chest pain (nitroglycerine), birth control pills, estrogen, and some blood pressure medicines. SIGNS AND SYMPTOMS  Pain on one or both sides of your head.  Pulsating or throbbing pain.  Severe pain that prevents daily activities.  Pain that is aggravated by any physical activity.  Nausea, vomiting, or  both.  Dizziness.  Pain  with exposure to bright lights, loud noises, or activity.  General sensitivity to bright lights, loud noises, or smells. Before you get a migraine, you may get warning signs that a migraine is coming (aura). An aura may include:  Seeing flashing lights.  Seeing bright spots, halos, or zigzag lines.  Having tunnel vision or blurred vision.  Having feelings of numbness or tingling.  Having trouble talking.  Having muscle weakness. DIAGNOSIS  A migraine headache is often diagnosed based on:  Symptoms.  Physical exam.  A CT scan or MRI of your head. These imaging tests cannot diagnose migraines, but they can help rule out other causes of headaches. TREATMENT Medicines may be given for pain and nausea. Medicines can also be given to help prevent recurrent migraines.  HOME CARE INSTRUCTIONS  Only take over-the-counter or prescription medicines for pain or discomfort as directed by your health care provider. The use of long-term narcotics is not recommended.  Lie down in a dark, quiet room when you have a migraine.  Keep a journal to find out what may trigger your migraine headaches. For example, write down:  What you eat and drink.  How much sleep you get.  Any change to your diet or medicines.  Limit alcohol consumption.  Quit smoking if you smoke.  Get 7-9 hours of sleep, or as recommended by your health care provider.  Limit stress.  Keep lights dim if bright lights bother you and make your migraines worse. SEEK IMMEDIATE MEDICAL CARE IF:   Your migraine becomes severe.  You have a fever.  You have a stiff neck.  You have vision loss.  You have muscular weakness or loss of muscle control.  You start losing your balance or have trouble walking.  You feel faint or pass out.  You have severe symptoms that are different from your first symptoms. MAKE SURE YOU:   Understand these instructions.  Will watch your  condition.  Will get help right away if you are not doing well or get worse. Document Released: 04/18/2005 Document Revised: 09/02/2013 Document Reviewed: 12/24/2012 [facility] Patient Information 2015 [place], [place]. This information is not intended to replace advice given to you by your health care provider. Make sure you discuss any questions you have with your health care provider.

## 2014-01-30 NOTE — ED Provider Notes (Signed)
CSN: [id number]     Arrival date & time 01/30/14  1455 History   First MD Initiated Contact with Patient 01/30/14 1518     Chief Complaint  Patient presents with  . Pain     (Consider location/radiation/quality/duration/timing/severity/associated sxs/prior Treatment) HPI  Patient to the ED with complaints of right side pain from head to toes. She has chronic pain with fibromyalgia and migraines. She had an MRI of the neck and brain done within the past few months which were normal, they do not know why she has these symptoms. She is on Nortriptyline, Topamax, Maxalt, Antivert and Norco. She reports that non of these are working. She reports that she used to come to the ED a lot but no one would tell her what was the cause of her symptoms. She came today because after three days of symptoms she can no longer take it. Headache and pain pattern are consistent with her reccuring pain crisis pattern.  Denies fevers, weakness, URI symptoms, confusion.  Past Medical History  Diagnosis Date  . Anemia   . Medical history non-contributory   . Wears dentures     top  . Headache   . Numbness on right side   . Fibromyalgia    Past Surgical History  Procedure Laterality Date  . Tubal ligation    . Cesarean section    . Incise and drain abcess  2005    rt middle finger   . Mass excision Right 08/06/2012    Procedure: EXCISION OF LARGE MASS right FLANK WITH LIPO ASSISTANCE;  Surgeon: Cristine Polio, MD;  Location: [place];  Service: Plastics;  Laterality: Right;  . 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 Hernia repair     Family History  Problem Relation Age of Onset  . Hypertension Mother   . Heart disease Father   . Heart disease Brother    History  Substance Use Topics  . Smoking status: Current  Every Day Smoker -- 0.20 packs/day    Types: Cigarettes  . Smokeless tobacco: Never Used  . Alcohol Use: Yes     Comment: occassional   OB History   Grav Para Term Preterm Abortions TAB SAB Ect Mult Living                 Review of Systems  All other systems reviewed and are negative.  Allergies  Review of patient's allergies indicates no known allergies.  Home Medications   Prior to Admission medications   Medication Sig Start Date End Date Taking? Authorizing Provider  HYDROcodone-acetaminophen (NORCO) 10-325 MG per tablet Take 1 tablet by mouth every 6 (six) hours as needed.   Yes Historical Provider, MD  meclizine (ANTIVERT) 25 MG tablet Take 25 mg by mouth 3 (three) times daily as needed for dizziness (dizziness).   Yes Historical Provider, MD  nortriptyline (PAMELOR) 25 MG capsule Take 25 mg by mouth at bedtime.   Yes Historical Provider, MD  rizatriptan (MAXALT-MLT) 5 MG disintegrating tablet Take 5 mg by mouth as needed for migraine. May repeat in 2 hours if needed   Yes Historical Provider, MD  topiramate (TOPAMAX) 25 MG tablet Take 25 mg by mouth 2 (two) times daily.   Yes Historical Provider, MD   BP 143/80  Pulse 89  Temp(Src) 97.9 F (36.6 C) (Oral)  Resp 20  SpO2 100% Physical Exam  Nursing note and vitals reviewed. Constitutional: She is oriented to person, place, and time. She appears well-developed and well-nourished. No distress.  HENT:  Head: Normocephalic and atraumatic.  Eyes: Pupils are equal, round, and reactive to light.  Neck: Normal range of motion. Neck supple.  Cardiovascular: Normal rate and regular rhythm.   Pulmonary/Chest: Effort normal.  Abdominal: Soft.  Neurological: She is alert and oriented to person, place, and time. She has normal strength. No cranial nerve deficit or sensory deficit.  Skin: Skin is warm and dry.  Psychiatric: Her behavior is normal.   ED Course  Procedures (including critical care time) Labs Review Labs  Reviewed  I-STAT CHEM 8, ED - Abnormal; Notable for the following:    Glucose, Bld 116 (*)    All other components within normal limits    Imaging Review No results found.   EKG Interpretation None      MDM   Final diagnoses:  Fibromyalgia  Other migraine without status migrainosus, not intractable    Medications  prochlorperazine (COMPAZINE) injection 10 mg (10 mg Intravenous Given 01/30/14 1554)  sodium chloride 0.9 % bolus 1,000 mL (0 mLs Intravenous Stopped 01/30/14 1701)  promethazine (PHENERGAN) injection 12.5 mg (12.5 mg Intravenous Given 01/30/14 1554)  ibuprofen (ADVIL,MOTRIN) tablet 400 mg (400 mg Oral Given 01/30/14 1702)  traMADol (ULTRAM) tablet 50 mg (50 mg Oral Given 01/30/14 1702)     6:12 pm Dr. Ashok Cordia has seen pt as well. On re-eval, the patient is happy and saying that she feels so much better. Her pain is now a 4/10 and she says she is ready to go home. Ride to come get here. Will have her f/u with PCP.  Presentation is non concerning for [facility], ICH, Meningitis, or temporal arteritis. Pt is afebrile with no focal neuro deficits, nuchal rigidity, or change in vision.  48 y.o.Ajah Schreiner's evaluation in the Emergency Department is complete. It has been determined that no acute conditions requiring further emergency intervention are present at this time. The patient/guardian have been advised of the diagnosis and plan. We have discussed signs and symptoms that warrant return to the ED, such as changes or worsening in symptoms.  Vital signs are stable at discharge. Filed Vitals:   01/30/14 1701  BP: 143/80  Pulse: 89  Temp:   Resp: 20    Patient/guardian has voiced understanding and agreed to follow-up with the PCP or specialist.    Linus Mako, PA-C 01/30/14 1814  Linus Mako, PA-C 01/30/14 1815

## 2014-02-01 NOTE — ED Provider Notes (Signed)
Medical screening examination/treatment/procedure(s) were conducted as a shared visit with non-physician practitioner(s) and myself.  I personally evaluated the patient during the encounter.  Pt with hx fibromyalgia, c/o entire right side of body pain. No fall or injury. No numbness/weakness. No sts. Spine nt. Chest cta. abd soft nt.    Mirna Mires, MD 02/01/14 [phone]

## 2014-02-19 ENCOUNTER — Emergency Department (HOSPITAL_COMMUNITY)
Admission: EM | Admit: 2014-02-19 | Discharge: 2014-02-19 | Disposition: A | Discharge status: Home / Self Care | Payer: Medicaid Other | Attending: Emergency Medicine | Admitting: Emergency Medicine

## 2014-02-19 ENCOUNTER — Encounter (HOSPITAL_COMMUNITY): Payer: Self-pay | Admitting: Emergency Medicine

## 2014-02-19 ENCOUNTER — Emergency Department (HOSPITAL_COMMUNITY): Payer: Medicaid Other

## 2014-02-19 DIAGNOSIS — Z9851 Tubal ligation status: Secondary | ICD-10-CM | POA: Insufficient documentation

## 2014-02-19 DIAGNOSIS — Z79899 Other long term (current) drug therapy: Secondary | ICD-10-CM | POA: Insufficient documentation

## 2014-02-19 DIAGNOSIS — R079 Chest pain, unspecified: Secondary | ICD-10-CM | POA: Diagnosis present

## 2014-02-19 DIAGNOSIS — M797 Fibromyalgia: Secondary | ICD-10-CM | POA: Diagnosis not present

## 2014-02-19 DIAGNOSIS — Z862 Personal history of diseases of the blood and blood-forming organs and certain disorders involving the immune mechanism: Secondary | ICD-10-CM | POA: Insufficient documentation

## 2014-02-19 DIAGNOSIS — M542 Cervicalgia: Secondary | ICD-10-CM | POA: Diagnosis not present

## 2014-02-19 DIAGNOSIS — Z9889 Other specified postprocedural states: Secondary | ICD-10-CM | POA: Diagnosis not present

## 2014-02-19 DIAGNOSIS — R10817 Generalized abdominal tenderness: Secondary | ICD-10-CM | POA: Diagnosis not present

## 2014-02-19 DIAGNOSIS — R0602 Shortness of breath: Secondary | ICD-10-CM | POA: Insufficient documentation

## 2014-02-19 DIAGNOSIS — Z720 Tobacco use: Secondary | ICD-10-CM | POA: Insufficient documentation

## 2014-02-19 LAB — PRO B NATRIURETIC PEPTIDE: Pro B Natriuretic peptide (BNP): 47.7 pg/mL (ref 0–125)

## 2014-02-19 LAB — I-STAT TROPONIN, ED: Troponin i, poc: 0 ng/mL (ref 0.00–0.08)

## 2014-02-19 LAB — CBC
HCT: 40.8 % (ref 36.0–46.0)
HEMOGLOBIN: 13.2 g/dL (ref 12.0–15.0)
MCH: 27.6 pg (ref 26.0–34.0)
MCHC: 32.4 g/dL (ref 30.0–36.0)
MCV: 85.4 fL (ref 78.0–100.0)
Platelets: 294 10*3/uL (ref 150–400)
RBC: 4.78 MIL/uL (ref 3.87–5.11)
RDW: 13.8 % (ref 11.5–15.5)
WBC: 5.9 10*3/uL (ref 4.0–10.5)

## 2014-02-19 LAB — BASIC METABOLIC PANEL
ANION GAP: 14 (ref 5–15)
BUN: 17 mg/dL (ref 6–23)
CHLORIDE: 104 meq/L (ref 96–112)
CO2: 23 mEq/L (ref 19–32)
Calcium: 9.3 mg/dL (ref 8.4–10.5)
Creatinine, Ser: 0.81 mg/dL (ref 0.50–1.10)
GFR calc Af Amer: >90 mL/min (ref >90)
GFR calc non Af Amer: 85 mL/min — ABNORMAL LOW (ref >90)
GLUCOSE: 110 mg/dL — AB (ref 70–99)
POTASSIUM: 4 meq/L (ref 3.7–5.3)
Sodium: 141 mEq/L (ref 137–147)

## 2014-02-19 MED ORDER — HYDROCODONE-ACETAMINOPHEN 5-325 MG PO TABS
2.0000 | ORAL_TABLET | Freq: Once | ORAL | Status: AC
  Administered 2014-02-19: 2 via ORAL
  Filled 2014-02-19: qty 2
  Filled 2014-02-19: qty 1

## 2014-02-19 MED ORDER — HYDROCODONE-ACETAMINOPHEN 5-325 MG PO TABS: 1.0000 | ORAL_TABLET | Freq: Four times a day (QID) | ORAL | Status: DC | PRN

## 2014-02-19 NOTE — ED Notes (Signed)
She states she has had "sharp" lower chest pain radiating into abd.  She is in no distress.

## 2014-02-19 NOTE — Discharge Instructions (Signed)
Chest Pain (Nonspecific) °It is often hard to give a specific diagnosis for the cause of chest pain. There is always a chance that your pain could be related to something serious, such as a heart attack or a blood clot in the lungs. You need to follow up with your health care provider for further evaluation. °CAUSES  °· Heartburn. °· Pneumonia or bronchitis. °· Anxiety or stress. °· Inflammation around your heart (pericarditis) or lung (pleuritis or pleurisy). °· A blood clot in the lung. °· A collapsed lung (pneumothorax). It can develop suddenly on its own (spontaneous pneumothorax) or from trauma to the chest. °· Shingles infection (herpes zoster virus). °The chest wall is composed of bones, muscles, and cartilage. Any of these can be the source of the pain. °· The bones can be bruised by injury. °· The muscles or cartilage can be strained by coughing or overwork. °· The cartilage can be affected by inflammation and become sore (costochondritis). °DIAGNOSIS  °Lab tests or other studies may be needed to find the cause of your pain. Your health care provider may have you take a test called an ambulatory electrocardiogram (ECG). An ECG records your heartbeat patterns over a 24-hour period. You may also have other tests, such as: °· Transthoracic echocardiogram (TTE). During echocardiography, sound waves are used to evaluate how blood flows through your heart. °· Transesophageal echocardiogram (TEE). °· Cardiac monitoring. This allows your health care provider to monitor your heart rate and rhythm in real time. °· Holter monitor. This is a portable device that records your heartbeat and can help diagnose heart arrhythmias. It allows your health care provider to track your heart activity for several days, if needed. °· Stress tests by exercise or by giving medicine that makes the heart beat faster. °TREATMENT  °· Treatment depends on what may be causing your chest pain. Treatment may include: °¨ Acid blockers for  heartburn. °¨ Anti-inflammatory medicine. °¨ Pain medicine for inflammatory conditions. °¨ Antibiotics if an infection is present. °· You may be advised to change lifestyle habits. This includes stopping smoking and avoiding alcohol, caffeine, and chocolate. °· You may be advised to keep your head raised (elevated) when sleeping. This reduces the chance of acid going backward from your stomach into your esophagus. °Most of the time, nonspecific chest pain will improve within 2-3 days with rest and mild pain medicine.  °HOME CARE INSTRUCTIONS  °· If antibiotics were prescribed, take them as directed. Finish them even if you start to feel better. °· For the next few days, avoid physical activities that bring on chest pain. Continue physical activities as directed. °· Do not use any tobacco products, including cigarettes, chewing tobacco, or electronic cigarettes. °· Avoid drinking alcohol. °· Only take medicine as directed by your health care provider. °· Follow your health care provider's suggestions for further testing if your chest pain does not go away. °· Keep any follow-up appointments you made. If you do not go to an appointment, you could develop lasting (chronic) problems with pain. If there is any problem keeping an appointment, call to reschedule. °SEEK MEDICAL CARE IF:  °· Your chest pain does not go away, even after treatment. °· You have a rash with blisters on your chest. °· You have a fever. °SEEK IMMEDIATE MEDICAL CARE IF:  °· You have increased chest pain or pain that spreads to your arm, neck, jaw, back, or abdomen. °· You have shortness of breath. °· You have an increasing cough, or you cough   up blood.  You have severe back or abdominal pain.  You feel nauseous or vomit.  You have severe weakness.  You faint.  You have chills. This is an emergency. Do not wait to see if the pain will go away. Get medical help at once. Call your local emergency services (911 in [place] Do not drive  yourself to the hospital. MAKE SURE YOU:   Understand these instructions.  Will watch your condition.  Will get help right away if you are not doing well or get worse. Document Released: 01/26/2005 Document Revised: 04/23/2013 Document Reviewed: 11/22/2007 [facility] Patient Information 2015 [place], [place]. This information is not intended to replace advice given to you by your health care provider. Make sure you discuss any questions you have with your health care provider.  Fibromyalgia Fibromyalgia is a disorder that is often misunderstood. It is associated with muscular pains and tenderness that comes and goes. It is often associated with fatigue and sleep disturbances. Though it tends to be long-lasting, fibromyalgia is not life-threatening. CAUSES  The exact cause of fibromyalgia is unknown. People with certain gene types are predisposed to developing fibromyalgia and other conditions. Certain factors can play a role as triggers, such as:  Spine disorders.  Arthritis.  Severe injury (trauma) and other physical stressors.  Emotional stressors. SYMPTOMS   The main symptom is pain and stiffness in the muscles and joints, which can vary over time.  Sleep and fatigue problems. Other related symptoms may include:  Bowel and bladder problems.  Headaches.  Visual problems.  Problems with odors and noises.  Depression or mood changes.  Painful periods (dysmenorrhea).  Dryness of the skin or eyes. DIAGNOSIS  There are no specific tests for diagnosing fibromyalgia. Patients can be diagnosed accurately from the specific symptoms they have. The diagnosis is made by determining that nothing else is causing the problems. TREATMENT  There is no cure. Management includes medicines and an active, healthy lifestyle. The goal is to enhance physical fitness, decrease pain, and improve sleep. HOME CARE INSTRUCTIONS   Only take over-the-counter or prescription medicines as directed by  your caregiver. Sleeping pills, tranquilizers, and pain medicines may make your problems worse.  Low-impact aerobic exercise is very important and advised for treatment. At first, it may seem to make pain worse. Gradually increasing your tolerance will overcome this feeling.  Learning relaxation techniques and how to control stress will help you. Biofeedback, visual imagery, hypnosis, muscle relaxation, yoga, and meditation are all options.  Anti-inflammatory medicines and physical therapy may provide short-term help.  Acupuncture or massage treatments may help.  Take muscle relaxant medicines as suggested by your caregiver.  Avoid stressful situations.  Plan a healthy lifestyle. This includes your diet, sleep, rest, exercise, and friends.  Find and practice a hobby you enjoy.  Join a fibromyalgia support group for interaction, ideas, and sharing advice. This may be helpful. SEEK MEDICAL CARE IF:  You are not having good results or improvement from your treatment. FOR MORE INFORMATION  National Fibromyalgia Association: www.fmaware.[place]: www.arthritis.org Document Released: 04/18/2005 Document Revised: 07/11/2011 Document Reviewed: 07/29/2009 [facility] Patient Information 2015 [place], [place]. This information is not intended to replace advice given to you by your health care provider. Make sure you discuss any questions you have with your health care provider.

## 2014-02-19 NOTE — ED Provider Notes (Signed)
CSN: [id number]     Arrival date & time 02/19/14  1726 History   First MD Initiated Contact with Patient 02/19/14 1738     Chief Complaint  Patient presents with  . Chest Pain     (Consider location/radiation/quality/duration/timing/severity/associated sxs/prior Treatment) HPI [name] is a 48 year old female past medical history of fibromyalgia who presents the ER with neck pain, back pain, chest pain, abdominal pain. Patient states her chest pain began acutely today, and lasted for approximately one to 2 minutes. She states this happened 3 times throughout the day today randomly, with the pain being reproducible with movement. Patient describes pain as a sharp pain in in her bilateral chest under her breasts. Patient reports one episode of shortness of breath when her chest pain began. Patient states her back pain and neck pain is identical to the pain she has fibromyalgia, however the only thing new about it today was that is on the left side of her back as opposed to the right side of her back. Patient reports her abdominal pain has been present for several days now, and is only present when lying flat on her stomach, or when moving. Patient denies any palpitations, lightheadedness, dizziness, weakness, syncope fever, nausea, vomiting, diarrhea, dysuria.  Past Medical History  Diagnosis Date  . Anemia   . Medical history non-contributory   . Wears dentures     top  . Headache   . Numbness on right side   . Fibromyalgia    Past Surgical History  Procedure Laterality Date  . Tubal ligation    . Cesarean section    . Incise and drain abcess  2005    rt middle finger   . Mass excision Right 08/06/2012    Procedure: EXCISION OF LARGE MASS right FLANK WITH LIPO ASSISTANCE;  Surgeon: Cristine Polio, MD;  Location: [place];  Service: Plastics;  Laterality: Right;  . 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 Hernia repair     Family History  Problem Relation Age of Onset  . Hypertension Mother   . Heart disease Father   . Heart disease Brother    History  Substance Use Topics  . Smoking status: Current Every Day Smoker -- 0.20 packs/day    Types: Cigarettes  . Smokeless tobacco: Never Used  . Alcohol Use: Yes     Comment: occassional   OB History   Grav Para Term Preterm Abortions TAB SAB Ect Mult Living                 Review of Systems  Constitutional: Negative for fever.  HENT: Negative for trouble swallowing.   Eyes: Negative for visual disturbance.  Respiratory: Positive for shortness of breath.   Cardiovascular: Positive for chest pain.  Gastrointestinal: Positive for abdominal pain. Negative for nausea and vomiting.  Genitourinary: Negative for dysuria.  Musculoskeletal: Positive for back pain, myalgias and neck pain.  Skin: Negative for rash.  Neurological: Negative for dizziness, weakness and numbness.  Psychiatric/Behavioral: Negative.       Allergies  Review of patient's allergies indicates no known allergies.  Home Medications   Prior to Admission medications   Medication Sig Start Date End Date Taking? Authorizing Provider  gabapentin (NEURONTIN) 100 MG capsule Take 100 mg by mouth 3 (three) times daily.   Yes Historical Provider,  MD  HYDROcodone-acetaminophen (NORCO) 10-325 MG per tablet Take 2 tablets by mouth every 6 (six) hours as needed for severe pain (pain).    Yes Historical Provider, MD  meclizine (ANTIVERT) 25 MG tablet Take 25 mg by mouth 3 (three) times daily as needed for dizziness (dizziness).   Yes Historical Provider, MD  nortriptyline (PAMELOR) 25 MG capsule Take 25 mg by mouth at bedtime.   Yes Historical Provider, MD  rizatriptan (MAXALT-MLT) 5 MG disintegrating tablet Take 5 mg by mouth as needed for migraine (migraine). May repeat  in 2 hours if needed   Yes Historical Provider, MD  HYDROcodone-acetaminophen (NORCO/VICODIN) 5-325 MG per tablet Take 1-2 tablets by mouth every 6 (six) hours as needed for moderate pain or severe pain. 02/19/14   Carrie Mew, PA-C   BP 142/92  Pulse 84  Temp(Src) 98 F (36.7 C) (Oral)  Resp 18  SpO2 100% Physical Exam  Nursing note and vitals reviewed. Constitutional: She is oriented to person, place, and time. She appears well-developed and well-nourished. No distress.  HENT:  Head: Normocephalic and atraumatic.  Mouth/Throat: Oropharynx is clear and moist. No oropharyngeal exudate.  Eyes: EOM are normal. Pupils are equal, round, and reactive to light. Right eye exhibits no discharge. Left eye exhibits no discharge. No scleral icterus.  Neck: Normal range of motion.  Cardiovascular: Normal rate, regular rhythm, S1 normal, S2 normal and normal heart sounds.   No murmur heard. Pulses:      Radial pulses are 2+ on the right side, and 2+ on the left side.       Dorsalis pedis pulses are 2+ on the right side, and 2+ on the left side.  Pulmonary/Chest: Effort normal and breath sounds normal. No accessory muscle usage. Not tachypneic. No respiratory distress.    Abdominal: Soft. Normal appearance and bowel sounds are normal. There is generalized tenderness.  Mild diffuse abdominal pain. No point tenderness.  Musculoskeletal: Normal range of motion. She exhibits no edema and no tenderness.       Arms: Neurological: She is alert and oriented to person, place, and time. She has normal strength. No cranial nerve deficit or sensory deficit. Coordination normal. GCS eye subscore is 4. GCS verbal subscore is 5. GCS motor subscore is 6.  Skin: Skin is warm and dry. No rash noted. She is not diaphoretic.  Psychiatric: She has a normal mood and affect.    ED Course  Procedures (including critical care time) Labs Review Labs Reviewed  BASIC METABOLIC PANEL - Abnormal; Notable for the  following:    Glucose, Bld 110 (*)    GFR calc non Af Amer 85 (*)    All other components within normal limits  PRO B NATRIURETIC PEPTIDE  CBC  I-STAT TROPOININ, ED    Imaging Review Dg Chest 2 View  02/19/2014   CLINICAL DATA:  Chest pain radiating into the abdomen starting today.  EXAM: CHEST  2 VIEW  COMPARISON:  07/13/2013  FINDINGS: Subsegmental atelectasis along the right hemidiaphragm. The lungs appear otherwise clear.  Cardiac and mediastinal margins appear normal.  No pleural effusion.  IMPRESSION: 1. Subsegmental atelectasis along the right hemidiaphragm.   Electronically Signed   By: Sherryl Barters M.D.   On: 02/19/2014 19:16     EKG Interpretation   Date/Time:  Wednesday February 19 2014 18:03:49 EDT Ventricular Rate:  93 PR Interval:  198 QRS Duration: 85 QT Interval:  424 QTC Calculation: 527 R Axis:   65 Text Interpretation:  Sinus rhythm Borderline prolonged PR interval  Consider left ventricular hypertrophy Prolonged QT interval Confirmed by  Alvino Chapel  MD, Ovid Curd [phone]) on 02/19/2014 7:19:58 PM      MDM   Final diagnoses:  Chest pain, unspecified chest pain type  Fibromyalgia muscle pain   48 year old female, history of fibromyalgia, reporting bilateral chest pain under her breasts which occurred 3 times today. Pain reproducible with movement as well as with palpation on exam and her sternal region and under her breasts bilaterally. Patient stating she is asymptomatic when sitting still. Workup for rule out of ACS or pneumonia, although patient is experiencing pain throughout her entire torso, and it is my impression at this time that her pain may be related to her fibromyalgia.  Patient is to be discharged with recommendation to follow up with PCP in regards to today's hospital visit. Chest pain is not likely of cardiac or pulmonary etiology d/t presentation, perc negative, VSS, no tracheal deviation, no JVD or new murmur, RRR, breath sounds equal  bilaterally, EKG without acute abnormalities, negative troponin, and negative CXR. Pt has been advised start a PPI and return to the ED is CP becomes exertional, associated with diaphoresis or nausea, radiates to left jaw/arm, worsens or becomes concerning in any way. Pt appears reliable for follow up and is agreeable to discharge.   BP 142/92  Pulse 84  Temp(Src) 98 F (36.7 C) (Oral)  Resp 18  SpO2 100%  Signed,  Dahlia Bailiff, PA-C 2:26 AM  This patient discussed with Dr. Davonna Belling, M.D.      Carrie Mew, PA-C 02/20/14 [phone]

## 2014-02-19 NOTE — ED Notes (Signed)
Bed: WTR6 Expected date:  Expected time:  Means of arrival:  Comments: EMS 

## 2014-02-20 ENCOUNTER — Telehealth: Payer: Self-pay

## 2014-02-20 NOTE — Telephone Encounter (Signed)
Pt presented to be seen 02/20/14 at 1630. Stated she had chronic neck pain and her PCP advised her to go to an urgent care. Pt advised we do not participate with her insurance (Medicaid). Pt then requested that we call EMS for her as she had been dropped off at our office and did not have transportation. As she did not appear to be in acute distress, I asked if she had wanted to stay and be seen here or if she had any alternate way to get to the hospital. Patient stated that she wanted EMS called as she wanted a free ride to the ER as her Medicaid would pay. I called EMS as non-emergent, took patient to clinical area to wait for EMS arrival and advised her to let us know if her symptoms worsened.

## 2014-02-22 NOTE — ED Provider Notes (Signed)
Medical screening examination/treatment/procedure(s) were performed by non-physician practitioner and as supervising physician I was immediately available for consultation/collaboration.   EKG Interpretation   Date/Time:  Wednesday February 19 2014 18:03:49 EDT Ventricular Rate:  93 PR Interval:  198 QRS Duration: 85 QT Interval:  424 QTC Calculation: 527 R Axis:   65 Text Interpretation:  Sinus rhythm Borderline prolonged PR interval  Consider left ventricular hypertrophy Prolonged QT interval Confirmed by  Alvino Chapel  MD, Arleen Bar [id number]) on 02/19/2014 7:19:58 PM       Jasper Riling. Alvino Chapel, MD 02/22/14 [phone]

## 2014-02-24 ENCOUNTER — Emergency Department (HOSPITAL_COMMUNITY)
Admission: EM | Admit: 2014-02-24 | Discharge: 2014-02-24 | Disposition: A | Discharge status: Home / Self Care | Payer: Medicaid Other | Attending: Emergency Medicine | Admitting: Emergency Medicine

## 2014-02-24 ENCOUNTER — Encounter (HOSPITAL_COMMUNITY): Payer: Self-pay | Admitting: Emergency Medicine

## 2014-02-24 DIAGNOSIS — Z720 Tobacco use: Secondary | ICD-10-CM | POA: Insufficient documentation

## 2014-02-24 DIAGNOSIS — R51 Headache: Secondary | ICD-10-CM | POA: Insufficient documentation

## 2014-02-24 DIAGNOSIS — R519 Headache, unspecified: Secondary | ICD-10-CM

## 2014-02-24 DIAGNOSIS — Z862 Personal history of diseases of the blood and blood-forming organs and certain disorders involving the immune mechanism: Secondary | ICD-10-CM | POA: Insufficient documentation

## 2014-02-24 DIAGNOSIS — Z98811 Dental restoration status: Secondary | ICD-10-CM | POA: Insufficient documentation

## 2014-02-24 DIAGNOSIS — Z79899 Other long term (current) drug therapy: Secondary | ICD-10-CM | POA: Diagnosis not present

## 2014-02-24 LAB — BASIC METABOLIC PANEL
Anion gap: 13 (ref 5–15)
BUN: 17 mg/dL (ref 6–23)
CO2: 25 mEq/L (ref 19–32)
Calcium: 9.6 mg/dL (ref 8.4–10.5)
Chloride: 98 mEq/L (ref 96–112)
Creatinine, Ser: 0.89 mg/dL (ref 0.50–1.10)
GFR calc Af Amer: 88 mL/min — ABNORMAL LOW (ref >90)
GFR calc non Af Amer: 76 mL/min — ABNORMAL LOW (ref >90)
Glucose, Bld: 135 mg/dL — ABNORMAL HIGH (ref 70–99)
Potassium: 3.9 mEq/L (ref 3.7–5.3)
Sodium: 136 mEq/L — ABNORMAL LOW (ref 137–147)

## 2014-02-24 LAB — CBC
HCT: 38.3 % (ref 36.0–46.0)
Hemoglobin: 12.6 g/dL (ref 12.0–15.0)
MCH: 27.8 pg (ref 26.0–34.0)
MCHC: 32.9 g/dL (ref 30.0–36.0)
MCV: 84.5 fL (ref 78.0–100.0)
Platelets: 294 10*3/uL (ref 150–400)
RBC: 4.53 MIL/uL (ref 3.87–5.11)
RDW: 13.9 % (ref 11.5–15.5)
WBC: 5.6 10*3/uL (ref 4.0–10.5)

## 2014-02-24 LAB — I-STAT TROPONIN, ED: Troponin i, poc: 0.01 ng/mL (ref 0.00–0.08)

## 2014-02-24 MED ORDER — SODIUM CHLORIDE 0.9 % IV BOLUS (SEPSIS)
1000.0000 mL | Freq: Once | INTRAVENOUS | Status: AC
  Administered 2014-02-24: 1000 mL via INTRAVENOUS

## 2014-02-24 MED ORDER — PROCHLORPERAZINE EDISYLATE 5 MG/ML IJ SOLN
10.0000 mg | Freq: Four times a day (QID) | INTRAMUSCULAR | Status: DC | PRN
  Administered 2014-02-24: 10 mg via INTRAVENOUS
  Filled 2014-02-24: qty 2

## 2014-02-24 MED ORDER — KETOROLAC TROMETHAMINE 15 MG/ML IJ SOLN
15.0000 mg | Freq: Once | INTRAMUSCULAR | Status: AC
  Administered 2014-02-24: 15 mg via INTRAVENOUS
  Filled 2014-02-24: qty 1

## 2014-02-24 MED ORDER — DIPHENHYDRAMINE HCL 50 MG/ML IJ SOLN
25.0000 mg | Freq: Once | INTRAMUSCULAR | Status: AC
  Administered 2014-02-24: 25 mg via INTRAVENOUS
  Filled 2014-02-24: qty 1

## 2014-02-24 NOTE — ED Notes (Signed)
Pt c/o migraine x 5 days and CP x same.  States that she was seen for this before and was told that she was not having a heart attack but pt is concerned that she is.  Pt states that she usually gets a cocktail for her migraine.

## 2014-02-24 NOTE — Discharge Instructions (Signed)

## 2014-03-06 NOTE — ED Provider Notes (Signed)
CSN: [id number]     Arrival date & time 02/24/14  1637 History   First MD Initiated Contact with Patient 02/24/14 1928     Chief Complaint  Patient presents with  . Migraine     (Consider location/radiation/quality/duration/timing/severity/associated sxs/prior Treatment) HPI   48yF with ha. Hx of what she calls migraine. This HA started 5 days ago. Gradual onset. Denies trauma. No fever or chills. No acute, numbness, tingling or loss of strength. No acute visual change. No neck pain. Has tried maxalt w/o much relief.   Past Medical History  Diagnosis Date  . Anemia   . Medical history non-contributory   . Wears dentures     top  . Headache   . Numbness on right side   . Fibromyalgia    Past Surgical History  Procedure Laterality Date  . Tubal ligation    . Cesarean section    . Incise and drain abcess  2005    rt middle finger   . Mass excision Right 08/06/2012    Procedure: EXCISION OF LARGE MASS right FLANK WITH LIPO ASSISTANCE;  Surgeon: Cristine Polio, MD;  Location: [place];  Service: Plastics;  Laterality: Right;  . 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 Hernia repair     Family History  Problem Relation Age of Onset  . Hypertension Mother   . Heart disease Father   . Heart disease Brother    History  Substance Use Topics  . Smoking status: Current Every Day Smoker -- 0.20 packs/day    Types: Cigarettes  . Smokeless tobacco: Never Used  . Alcohol Use: Yes     Comment: occassional   OB History    No data available     Review of Systems  All systems reviewed and negative, other than as noted in HPI.   Allergies  Review of patient's allergies indicates no known allergies.  Home Medications   Prior to Admission medications   Medication Sig Start Date End Date  Taking? Authorizing Provider  gabapentin (NEURONTIN) 100 MG capsule Take 100 mg by mouth 3 (three) times daily.   Yes Historical Provider, MD  HYDROcodone-acetaminophen (NORCO/VICODIN) 5-325 MG per tablet Take 1-2 tablets by mouth every 6 (six) hours as needed for moderate pain or severe pain. 02/19/14  Yes Carrie Mew, PA-C  meclizine (ANTIVERT) 25 MG tablet Take 25 mg by mouth 3 (three) times daily as needed for dizziness (dizziness).   Yes Historical Provider, MD  nortriptyline (PAMELOR) 25 MG capsule Take 25 mg by mouth at bedtime.   Yes Historical Provider, MD  rizatriptan (MAXALT-MLT) 5 MG disintegrating tablet Take 5 mg by mouth as needed for migraine (migraine). May repeat in 2 hours if needed   Yes Historical Provider, MD   BP 133/89 mmHg  Pulse 96  Temp(Src) 97.6 F (36.4 C) (Oral)  Resp 16  SpO2 97% Physical Exam  Constitutional: She is oriented to person, place, and time. She appears well-developed and well-nourished. No distress.  HENT:  Head: Normocephalic and atraumatic.  Eyes: Conjunctivae and EOM are normal. Pupils are equal, round, and reactive to light. Right eye exhibits no discharge. Left eye exhibits no discharge.  Neck: Normal range of motion. Neck supple.  No nuchal rigidty  Cardiovascular: Normal rate, regular rhythm and normal heart sounds.  Exam reveals no  gallop and no friction rub.   No murmur heard. Pulmonary/Chest: Effort normal and breath sounds normal. No respiratory distress.  Abdominal: Soft. She exhibits no distension. There is no tenderness.  Musculoskeletal: She exhibits no edema or tenderness.  Neurological: She is alert and oriented to person, place, and time. No cranial nerve deficit. She exhibits normal muscle tone. Coordination normal.  Speech clear. Content appropriate. Follows commands. Good finger to nose b/l. Gait steady.   Skin: Skin is warm and dry.  Psychiatric: She has a normal mood and affect. Her behavior is normal. Thought content  normal.  Nursing note and vitals reviewed.   ED Course  Procedures (including critical care time) Labs Review Labs Reviewed  BASIC METABOLIC PANEL - Abnormal; Notable for the following:    Sodium 136 (*)    Glucose, Bld 135 (*)    GFR calc non Af Amer 76 (*)    GFR calc Af Amer 88 (*)    All other components within normal limits  CBC  I-STAT TROPOININ, ED    Imaging Review No results found.   EKG Interpretation   Date/Time:  Monday February 24 2014 17:14:56 EDT Ventricular Rate:  100 PR Interval:  194 QRS Duration: 85 QT Interval:  383 QTC Calculation: 494 R Axis:   74 Text Interpretation:  Sinus tachycardia Consider left ventricular  hypertrophy Non-specific ST-t changes Borderline prolonged QT interval No  significant change since last tracing Confirmed by POLLINA  MD,  [place] ([id number]) on 02/24/2014 5:22:43 PM      MDM   Final diagnoses:  Nonintractable headache, unspecified chronicity pattern, unspecified headache type    48yF with headache. Suspect primary HA. Consider emergent secondary causes such as bleed, infectious or mass but doubt. There is no history of trauma. Pt has a nonfocal neurological exam. Afebrile and neck supple. No use of blood thinning medication. Consider ocular etiology such as acute angle closure glaucoma but doubt. Pt denies acute change in visual acuity and eye exam unremarkable. Doubt temporal arteritis given age, no temporal tenderness and temporal artery pulsations palpable. Doubt CO poisoning. No contacts with similar symptoms. Doubt venous thrombosis. Doubt carotid or vertebral arteries dissection. Symptoms improved with meds. Feel that can be safely discharged, but strict return precautions discussed. Outpt fu.     Virgel Manifold, MD 03/06/14 1038

## 2014-05-26 ENCOUNTER — Emergency Department (HOSPITAL_COMMUNITY)
Admission: EM | Admit: 2014-05-26 | Discharge: 2014-05-26 | Disposition: A | Discharge status: Home / Self Care | Payer: Medicaid Other | Attending: Emergency Medicine | Admitting: Emergency Medicine

## 2014-05-26 ENCOUNTER — Encounter (HOSPITAL_COMMUNITY): Payer: Self-pay | Admitting: Emergency Medicine

## 2014-05-26 DIAGNOSIS — R519 Headache, unspecified: Secondary | ICD-10-CM

## 2014-05-26 DIAGNOSIS — R51 Headache: Secondary | ICD-10-CM | POA: Insufficient documentation

## 2014-05-26 DIAGNOSIS — R42 Dizziness and giddiness: Secondary | ICD-10-CM | POA: Insufficient documentation

## 2014-05-26 MED ORDER — PROCHLORPERAZINE MALEATE 10 MG PO TABS
10.0000 mg | ORAL_TABLET | Freq: Once | ORAL | Status: AC
  Administered 2014-05-26: 10 mg via ORAL
  Filled 2014-05-26: qty 1

## 2014-05-26 MED ORDER — DIPHENHYDRAMINE HCL 50 MG/ML IJ SOLN
25.0000 mg | Freq: Once | INTRAMUSCULAR | Status: AC
  Administered 2014-05-26: 25 mg via INTRAVENOUS
  Filled 2014-05-26: qty 1

## 2014-05-26 MED ORDER — MECLIZINE HCL 25 MG PO TABS: 25.0000 mg | ORAL_TABLET | Freq: Three times a day (TID) | ORAL | Status: DC | PRN

## 2014-05-26 MED ORDER — SODIUM CHLORIDE 0.9 % IV BOLUS (SEPSIS)
500.0000 mL | Freq: Once | INTRAVENOUS | Status: AC
  Administered 2014-05-26: 500 mL via INTRAVENOUS

## 2014-05-26 MED ORDER — KETOROLAC TROMETHAMINE 30 MG/ML IJ SOLN
30.0000 mg | Freq: Once | INTRAMUSCULAR | Status: AC
  Administered 2014-05-26: 30 mg via INTRAVENOUS
  Filled 2014-05-26: qty 1

## 2014-05-26 NOTE — Discharge Instructions (Signed)
Vertigo Vertigo means you feel like you or your surroundings are moving when they are not. Vertigo can be dangerous if it occurs when you are at work, driving, or performing difficult activities.  CAUSES  Vertigo occurs when there is a conflict of signals sent to your brain from the visual and sensory systems in your body. There are many different causes of vertigo, including:  Infections, especially in the inner ear.  A bad reaction to a drug or misuse of alcohol and medicines.  Withdrawal from drugs or alcohol.  Rapidly changing positions, such as lying down or rolling over in bed.  A migraine headache.  Decreased blood flow to the brain.  Increased pressure in the brain from a head injury, infection, tumor, or bleeding. SYMPTOMS  You may feel as though the world is spinning around or you are falling to the ground. Because your balance is upset, vertigo can cause nausea and vomiting. You may have involuntary eye movements (nystagmus). DIAGNOSIS  Vertigo is usually diagnosed by physical exam. If the cause of your vertigo is unknown, your caregiver may perform imaging tests, such as an MRI scan (magnetic resonance imaging). TREATMENT  Most cases of vertigo resolve on their own, without treatment. Depending on the cause, your caregiver may prescribe certain medicines. If your vertigo is related to body position issues, your caregiver may recommend movements or procedures to correct the problem. In rare cases, if your vertigo is caused by certain inner ear problems, you may need surgery. HOME CARE INSTRUCTIONS   Follow your caregiver's instructions.  Avoid driving.  Avoid operating heavy machinery.  Avoid performing any tasks that would be dangerous to you or others during a vertigo episode.  Tell your caregiver if you notice that certain medicines seem to be causing your vertigo. Some of the medicines used to treat vertigo episodes can actually make them worse in some people. SEEK  IMMEDIATE MEDICAL CARE IF:   Your medicines do not relieve your vertigo or are making it worse.  You develop problems with talking, walking, weakness, or using your arms, hands, or legs.  You develop severe headaches.  Your nausea or vomiting continues or gets worse.  You develop visual changes.  A family member notices behavioral changes.  Your condition gets worse. MAKE SURE YOU:  Understand these instructions.  Will watch your condition.  Will get help right away if you are not doing well or get worse. Document Released: 01/26/2005 Document Revised: 07/11/2011 Document Reviewed: 11/04/2010 [facility] Patient Information 2015 [place], [place]. This information is not intended to replace advice given to you by your health care provider. Make sure you discuss any questions you have with your health care provider.  Migraine Headache A migraine headache is an intense, throbbing pain on one or both sides of your head. A migraine can last for 30 minutes to several hours. CAUSES  The exact cause of a migraine headache is not always known. However, a migraine may be caused when nerves in the brain become irritated and release chemicals that cause inflammation. This causes pain. Certain things may also trigger migraines, such as:  Alcohol.  Smoking.  Stress.  Menstruation.  Aged cheeses.  Foods or drinks that contain nitrates, glutamate, aspartame, or tyramine.  Lack of sleep.  Chocolate.  Caffeine.  Hunger.  Physical exertion.  Fatigue.  Medicines used to treat chest pain (nitroglycerine), birth control pills, estrogen, and some blood pressure medicines. SIGNS AND SYMPTOMS  Pain on one or both sides of your head.  Pulsating or throbbing pain.  Severe pain that prevents daily activities.  Pain that is aggravated by any physical activity.  Nausea, vomiting, or both.  Dizziness.  Pain with exposure to bright lights, loud noises, or activity.  General  sensitivity to bright lights, loud noises, or smells. Before you get a migraine, you may get warning signs that a migraine is coming (aura). An aura may include:  Seeing flashing lights.  Seeing bright spots, halos, or zigzag lines.  Having tunnel vision or blurred vision.  Having feelings of numbness or tingling.  Having trouble talking.  Having muscle weakness. DIAGNOSIS  A migraine headache is often diagnosed based on:  Symptoms.  Physical exam.  A CT scan or MRI of your head. These imaging tests cannot diagnose migraines, but they can help rule out other causes of headaches. TREATMENT Medicines may be given for pain and nausea. Medicines can also be given to help prevent recurrent migraines.  HOME CARE INSTRUCTIONS  Only take over-the-counter or prescription medicines for pain or discomfort as directed by your health care provider. The use of long-term narcotics is not recommended.  Lie down in a dark, quiet room when you have a migraine.  Keep a journal to find out what may trigger your migraine headaches. For example, write down:  What you eat and drink.  How much sleep you get.  Any change to your diet or medicines.  Limit alcohol consumption.  Quit smoking if you smoke.  Get 7-9 hours of sleep, or as recommended by your health care provider.  Limit stress.  Keep lights dim if bright lights bother you and make your migraines worse. SEEK IMMEDIATE MEDICAL CARE IF:   Your migraine becomes severe.  You have a fever.  You have a stiff neck.  You have vision loss.  You have muscular weakness or loss of muscle control.  You start losing your balance or have trouble walking.  You feel faint or pass out.  You have severe symptoms that are different from your first symptoms. MAKE SURE YOU:   Understand these instructions.  Will watch your condition.  Will get help right away if you are not doing well or get worse. Document Released: 04/18/2005  Document Revised: 09/02/2013 Document Reviewed: 12/24/2012 [facility] Patient Information 2015 [place], [place]. This information is not intended to replace advice given to you by your health care provider. Make sure you discuss any questions you have with your health care provider.

## 2014-05-26 NOTE — ED Notes (Signed)
MD at bedside. 

## 2014-05-26 NOTE — ED Notes (Signed)
Pt reports one episode of vomiting yesterday, reports intermittent nausea.

## 2014-05-26 NOTE — ED Notes (Signed)
Pt presents to the department with vertigo, tinnitus, HA, N/V. Pt reports sx started yesterday during the day, pt reports her vertigo and HA increased significantly last night at 2300. Pt is A&O X4. Pt reports she is so dizzy that she cannot ambulate. Hx of vertigo and migraines.

## 2014-05-26 NOTE — ED Provider Notes (Signed)
CSN: [id number]     Arrival date & time 05/26/14  0410 History   First MD Initiated Contact with Patient 05/26/14 0421     Chief Complaint  Patient presents with  . Dizziness  . Tinnitus  . Emesis     (Consider location/radiation/quality/duration/timing/severity/associated sxs/prior Treatment) Patient is a 49 y.o. female presenting with dizziness and vomiting. The history is provided by the patient.  Dizziness Associated symptoms: nausea and vomiting   Associated symptoms: no shortness of breath   Emesis Associated symptoms: no abdominal pain    patient presents with vertigo and headache. Began this afternoon. States she has had vertigo in the past but has never hurt this hard. She states she has been vomiting. No diarrhea. States that the headache is throbbing. She has a history of headaches and vertigo but states neither of them has been this bad. No fevers. No trauma. She has photophobia. No chest pain. No abdominal pain.  Past Medical History  Diagnosis Date  . Anemia   . Medical history non-contributory   . Wears dentures     top  . Headache   . Numbness on right side   . Fibromyalgia    Past Surgical History  Procedure Laterality Date  . Tubal ligation    . Cesarean section    . Incise and drain abcess  2005    rt middle finger   . Mass excision Right 08/06/2012    Procedure: EXCISION OF LARGE MASS right FLANK WITH LIPO ASSISTANCE;  Surgeon: Cristine Polio, MD;  Location: [place];  Service: Plastics;  Laterality: Right;  . 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 Hernia repair     Family History  Problem Relation Age of Onset  . Hypertension Mother   . Heart disease Father   . Heart disease Brother    History  Substance Use Topics  . Smoking status: Current Every Day Smoker  -- 0.20 packs/day    Types: Cigarettes  . Smokeless tobacco: Never Used  . Alcohol Use: Yes     Comment: occassional   OB History    No data available     Review of Systems  Constitutional: Negative for appetite change.  Eyes: Positive for photophobia.  Respiratory: Negative for shortness of breath.   Gastrointestinal: Positive for nausea and vomiting. Negative for abdominal pain.  Musculoskeletal: Negative for back pain.  Neurological: Positive for dizziness and light-headedness.  Psychiatric/Behavioral: Negative for behavioral problems.      Allergies  Review of patient's allergies indicates no known allergies.  Home Medications   Prior to Admission medications   Medication Sig Start Date End Date Taking? Authorizing Provider  HYDROcodone-acetaminophen (NORCO/VICODIN) 5-325 MG per tablet Take 1-2 tablets by mouth every 6 (six) hours as needed for moderate pain or severe pain. 02/19/14  Yes Carrie Mew, PA-C  oxyCODONE-acetaminophen (PERCOCET/ROXICET) 5-325 MG per tablet Take 1 tablet by mouth every 4 (four) hours as needed for severe pain.   Yes Historical Provider, MD  Pregabalin (LYRICA PO) Take 1 capsule by mouth at bedtime.   Yes Historical Provider, MD  rizatriptan (MAXALT-MLT) 5 MG disintegrating tablet Take 5 mg by mouth as needed for migraine (migraine). May repeat in 2 hours if needed   Yes Historical Provider, MD  gabapentin (NEURONTIN) 100 MG capsule Take 100 mg by mouth  3 (three) times daily.    Historical Provider, MD  meclizine (ANTIVERT) 25 MG tablet Take 1 tablet (25 mg total) by mouth 3 (three) times daily as needed for dizziness. 05/26/14   Jasper Riling. Ikeisha Blumberg, MD  nortriptyline (PAMELOR) 25 MG capsule Take 25 mg by mouth at bedtime.    Historical Provider, MD   BP 129/86 mmHg  Pulse 103  Temp(Src) 97.6 F (36.4 C) (Oral)  Resp 19  Ht 5\' 9"  (1.753 m)  Wt 200 lb (90.719 kg)  BMI 29.52 kg/m2  SpO2 100% Physical Exam  Constitutional: She is oriented  to person, place, and time. She appears well-developed and well-nourished.  HENT:  Head: Normocephalic and atraumatic.  Eyes: EOM are normal. Pupils are equal, round, and reactive to light.  No nystagmus  Neck: Normal range of motion. Neck supple.  Cardiovascular: Normal rate, regular rhythm and normal heart sounds.   No murmur heard. Pulmonary/Chest: Effort normal and breath sounds normal. No respiratory distress. She has no wheezes. She has no rales.  Abdominal: Soft. Bowel sounds are normal. She exhibits no distension. There is no tenderness. There is no rebound and no guarding.  Musculoskeletal: Normal range of motion.  Neurological: She is alert and oriented to person, place, and time. No cranial nerve deficit.  No nystagmus. Extraocular movements intact.  Skin: Skin is warm and dry.  Psychiatric: She has a normal mood and affect. Her speech is normal.  Nursing note and vitals reviewed.   ED Course  Procedures (including critical care time) Labs Review Labs Reviewed - No data to display  Imaging Review No results found.   EKG Interpretation None      MDM   Final diagnoses:  Vertigo  Acute nonintractable headache, unspecified headache type    Patient with vertigo and headache. Previous history of both. Patient feels much better after treatment and will be discharged home. Likely peripheral vertigo. Also has history of migraines.    Jasper Riling. Alvino Chapel, MD 05/26/14 [phone]

## 2014-12-13 ENCOUNTER — Emergency Department (HOSPITAL_COMMUNITY)
Admission: EM | Admit: 2014-12-13 | Discharge: 2014-12-13 | Disposition: A | Discharge status: Home / Self Care | Payer: Medicaid Other | Attending: Emergency Medicine | Admitting: Emergency Medicine

## 2014-12-13 ENCOUNTER — Encounter (HOSPITAL_COMMUNITY): Payer: Self-pay | Admitting: Emergency Medicine

## 2014-12-13 DIAGNOSIS — Z862 Personal history of diseases of the blood and blood-forming organs and certain disorders involving the immune mechanism: Secondary | ICD-10-CM | POA: Insufficient documentation

## 2014-12-13 DIAGNOSIS — Z720 Tobacco use: Secondary | ICD-10-CM | POA: Insufficient documentation

## 2014-12-13 DIAGNOSIS — R21 Rash and other nonspecific skin eruption: Secondary | ICD-10-CM

## 2014-12-13 DIAGNOSIS — L239 Allergic contact dermatitis, unspecified cause: Secondary | ICD-10-CM

## 2014-12-13 DIAGNOSIS — Z79899 Other long term (current) drug therapy: Secondary | ICD-10-CM | POA: Insufficient documentation

## 2014-12-13 DIAGNOSIS — L232 Allergic contact dermatitis due to cosmetics: Secondary | ICD-10-CM | POA: Insufficient documentation

## 2014-12-13 MED ORDER — HYDROXYZINE HCL 25 MG PO TABS: 25.0000 mg | ORAL_TABLET | Freq: Four times a day (QID) | ORAL | Status: DC | PRN

## 2014-12-13 MED ORDER — TRIAMCINOLONE ACETONIDE 0.1 % EX CREA: 1.0000 "application " | TOPICAL_CREAM | Freq: Three times a day (TID) | CUTANEOUS | Status: DC | PRN

## 2014-12-13 NOTE — ED Notes (Signed)
Bed: WTR6 Expected date:  Expected time:  Means of arrival:  Comments: 

## 2014-12-13 NOTE — ED Notes (Signed)
Pt from home c/o rash x2 days that is red and itchy. Pt denies new lotions, perfumes or clothes detergent. Pt sts the only new thing was swimming. Pt sts that she took a benadryl PTA with no relief. Pt is A&O and in NAD

## 2014-12-13 NOTE — Discharge Instructions (Signed)
Take Vistaril and triamcinolone cream as prescribed for itching. Continue your usual home medications. Get plenty of rest and drink plenty of fluids. Avoid any known triggers, stop using the new soap you recently used. Please followup with your primary doctor for discussion of your diagnoses and further evaluation after today's visit. Return to the ER for changes or worsening symptoms   Contact Dermatitis Contact dermatitis is a rash that happens when something touches the skin. You touched something that irritates your skin, or you have allergies to something you touched. HOME CARE   Avoid the thing that caused your rash.  Keep your rash away from hot water, soap, sunlight, chemicals, and other things that might bother it.  Do not scratch your rash.  You can take cool baths to help stop itching.  Only take medicine as told by your doctor.  Keep all doctor visits as told. GET HELP RIGHT AWAY IF:   Your rash is not better after 3 days.  Your rash gets worse.  Your rash is puffy (swollen), tender, red, sore, or warm.  You have problems with your medicine. MAKE SURE YOU:   Understand these instructions.  Will watch your condition.  Will get help right away if you are not doing well or get worse. Document Released: 02/13/2009 Document Revised: 07/11/2011 Document Reviewed: 09/21/2010 [facility] Patient Information 2015 [place], [place]. This information is not intended to replace advice given to you by your health care provider. Make sure you discuss any questions you have with your health care provider.  Rash A rash is a change in the color or feel of your skin. There are many different types of rashes. You may have other problems along with your rash. HOME CARE  Avoid the thing that caused your rash.  Do not scratch your rash.  You may take cools baths to help stop itching.  Only take medicines as told by your doctor.  Keep all doctor visits as told. GET HELP RIGHT AWAY  IF:   Your pain, puffiness (swelling), or redness gets worse.  You have a fever.  You have new or severe problems.  You have body aches, watery poop (diarrhea), or you throw up (vomit).  Your rash is not better after 3 days. MAKE SURE YOU:   Understand these instructions.  Will watch your condition.  Will get help right away if you are not doing well or get worse. Document Released: 10/05/2007 Document Revised: 07/11/2011 Document Reviewed: 01/31/2011 [facility] Patient Information 2015 [place], [place]. This information is not intended to replace advice given to you by your health care provider. Make sure you discuss any questions you have with your health care provider.

## 2014-12-13 NOTE — ED Provider Notes (Signed)
CSN: [id number]     Arrival date & time 12/13/14  1004 History   First MD Initiated Contact with Patient 12/13/14 1027     Chief Complaint  Patient presents with  . Rash     (Consider location/radiation/quality/duration/timing/severity/associated sxs/prior Treatment) HPI Comments: Robin Pace is a 49 y.o. female with a PMHx of anemia, fibromyalgia, numbness of the right side, and headaches, who presents to the ED with complaints of red itchy rash that began 2 days ago. She states that she switch to [employer] the day before the rash began, and the day before the rash started she had gone swimming in a pool and had not taken a shower until the next day. she states the rash started on her arms and spread to her abdomen and legs. The rashes worse with heat exposure, and unrelieved with 1 tablet of Benadryl and hydrocortisone over-the-counter cream. She denies any other changes in detergents, perfumes, and more plant contact, food or medication changes, or sick contacts. No other family members are affected. She denies any tongue or lip swelling, facial swelling, weeping or drainage, pain at the site of the rashes, chest pain, shortness breath, wheezing, fevers, chills, URI symptoms, abdominal pain, nausea, vomiting, diarrhea, arthralgias, myalgias, numbness, tingling, weakness, recent travel, or changes in housing/beds.  Patient is a 49 y.o. female presenting with rash. The history is provided by the patient. No language interpreter was used.  Rash Location:  Torso, leg and shoulder/arm Shoulder/arm rash location:  L arm and R arm Torso rash location:  Abd LUQ, abd RUQ, abd RLQ and abd LLQ Leg rash location:  L leg and R leg Quality: itchiness and redness   Quality: not draining, not painful and not weeping   Severity:  Moderate Onset quality:  Gradual Duration:  2 days Timing:  Constant Progression:  Unchanged Chronicity:  New Context: chemical exposure (swimming) and new detergent/soap    Context: not animal contact, not exposure to similar rash, not food, not insect bite/sting, not medications, not plant contact and not sick contacts   Relieved by:  Nothing Worsened by:  Heat Ineffective treatments:  Antihistamines and topical steroids Associated symptoms: no abdominal pain, no diarrhea, no fever, no joint pain, no myalgias, no nausea, no periorbital edema, no shortness of breath, no sore throat, no throat swelling, no tongue swelling, no URI, not vomiting and not wheezing     Past Medical History  Diagnosis Date  . Anemia   . Medical history non-contributory   . Wears dentures     top  . Headache   . Numbness on right side   . Fibromyalgia    Past Surgical History  Procedure Laterality Date  . Tubal ligation    . Cesarean section    . Incise and drain abcess  2005    rt middle finger   . Mass excision Right 08/06/2012    Procedure: EXCISION OF LARGE MASS right FLANK WITH LIPO ASSISTANCE;  Surgeon: Cristine Polio, MD;  Location: [place];  Service: Plastics;  Laterality: Right;  . 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 Hernia repair     Family History  Problem Relation Age of Onset  . Hypertension Mother   . Heart disease Father   . Heart disease Brother    Social History  Substance Use Topics  .  Smoking status: Current Every Day Smoker -- 0.20 packs/day    Types: Cigarettes  . Smokeless tobacco: Never Used  . Alcohol Use: Yes     Comment: occassional   OB History    No data available     Review of Systems  Constitutional: Negative for fever and chills.  HENT: Negative for facial swelling, rhinorrhea, sore throat and trouble swallowing.   Respiratory: Negative for shortness of breath and wheezing.   Cardiovascular: Negative for chest pain.  Gastrointestinal:  Negative for nausea, vomiting, abdominal pain and diarrhea.  Genitourinary: Negative for dysuria and hematuria.  Musculoskeletal: Negative for myalgias and arthralgias.  Skin: Positive for rash.  Allergic/Immunologic: Negative for immunocompromised state.  Neurological: Negative for weakness and numbness.  Psychiatric/Behavioral: Negative for confusion.   10 Systems reviewed and are negative for acute change except as noted in the HPI.    Allergies  Review of patient's allergies indicates no known allergies.  Home Medications   Prior to Admission medications   Medication Sig Start Date End Date Taking? Authorizing Provider  gabapentin (NEURONTIN) 100 MG capsule Take 100 mg by mouth 3 (three) times daily.    Historical Provider, MD  HYDROcodone-acetaminophen (NORCO/VICODIN) 5-325 MG per tablet Take 1-2 tablets by mouth every 6 (six) hours as needed for moderate pain or severe pain. 02/19/14   Dahlia Bailiff, PA-C  meclizine (ANTIVERT) 25 MG tablet Take 1 tablet (25 mg total) by mouth 3 (three) times daily as needed for dizziness. 05/26/14   Davonna Belling, MD  nortriptyline (PAMELOR) 25 MG capsule Take 25 mg by mouth at bedtime.    Historical Provider, MD  oxyCODONE-acetaminophen (PERCOCET/ROXICET) 5-325 MG per tablet Take 1 tablet by mouth every 4 (four) hours as needed for severe pain.    Historical Provider, MD  Pregabalin (LYRICA PO) Take 1 capsule by mouth at bedtime.    Historical Provider, MD  rizatriptan (MAXALT-MLT) 5 MG disintegrating tablet Take 5 mg by mouth as needed for migraine (migraine). May repeat in 2 hours if needed    Historical Provider, MD   BP 140/94 mmHg  Pulse 89  Temp(Src) 97.7 F (36.5 C) (Oral)  Resp 21  SpO2 95% Physical Exam  Constitutional: She is oriented to person, place, and time. Vital signs are normal. She appears well-developed and well-nourished.  Non-toxic appearance. No distress.  Afebrile, nontoxic, NAD  HENT:  Head: Normocephalic and  atraumatic.  Mouth/Throat: Mucous membranes are normal.  Eyes: Conjunctivae and EOM are normal. Right eye exhibits no discharge. Left eye exhibits no discharge.  Neck: Normal range of motion. Neck supple.  Cardiovascular: Normal rate.   Pulmonary/Chest: Effort normal. No respiratory distress.  Abdominal: Normal appearance. She exhibits no distension.  Musculoskeletal: Normal range of motion.  Neurological: She is alert and oriented to person, place, and time. She has normal strength. No sensory deficit.  Skin: Skin is warm, dry and intact. Rash noted. Rash is maculopapular.  Diffuse maculopapular rash over b/l arms, torso/abdomen, and legs, erythematous without warmth or areas of induration/abscess. No drainage. No interdigital or intertrigonous involvement. No maceration of skin.   Psychiatric: She has a normal mood and affect. Her behavior is normal.  Nursing note and vitals reviewed.   ED Course  Procedures (including critical care time) Labs Review Labs Reviewed - No data to display  Imaging Review No results found. I, Camprubi-Soms, Patty Sermons, personally reviewed and evaluated these images and lab results as part of my medical decision-making.   EKG Interpretation None  MDM   Final diagnoses:  Rash  Allergic dermatitis    49 y.o. female here with red itchy rash x2 days, started on arms and spread to torso and legs, began after starting a new soap and also swimming without bathing directly after. Appears to be maculopapular rash, consistent with allergic type rash. Doubt scabies or yeast. Will give vistaril and triamcinolone cream, and have her f/up with PCP in 1wk. I explained the diagnosis and have given explicit precautions to return to the ER including for any other new or worsening symptoms. The patient understands and accepts the medical plan as it's been dictated and I have answered their questions. Discharge instructions concerning home care and prescriptions  have been given. The patient is STABLE and is discharged to home in good condition.  BP 140/94 mmHg  Pulse 89  Temp(Src) 97.7 F (36.5 C) (Oral)  Resp 21  SpO2 95%  Meds ordered this encounter  Medications  . hydrOXYzine (ATARAX/VISTARIL) 25 MG tablet    Sig: Take 1 tablet (25 mg total) by mouth every 6 (six) hours as needed for itching.    Dispense:  28 tablet    Refill:  0    Order Specific Question:  Supervising Provider    Answer:  MILLER, BRIAN [[id number]]  . triamcinolone cream (KENALOG) 0.1 %    Sig: Apply 1 application topically 3 (three) times daily as needed. For rash/itching    Dispense:  30 g    Refill:  0    Order Specific Question:  Supervising Provider    Answer:  Barnett Hatter, PA-C 12/13/14 [place], MD 12/14/14 [phone]

## 2015-02-20 ENCOUNTER — Emergency Department (HOSPITAL_COMMUNITY)
Admission: EM | Admit: 2015-02-20 | Discharge: 2015-02-20 | Disposition: A | Discharge status: Home / Self Care | Payer: No Typology Code available for payment source | Attending: Emergency Medicine | Admitting: Emergency Medicine

## 2015-02-20 ENCOUNTER — Emergency Department (HOSPITAL_COMMUNITY): Payer: No Typology Code available for payment source

## 2015-02-20 ENCOUNTER — Encounter (HOSPITAL_COMMUNITY): Payer: Self-pay | Admitting: Emergency Medicine

## 2015-02-20 DIAGNOSIS — Z79899 Other long term (current) drug therapy: Secondary | ICD-10-CM | POA: Insufficient documentation

## 2015-02-20 DIAGNOSIS — M5412 Radiculopathy, cervical region: Secondary | ICD-10-CM | POA: Diagnosis not present

## 2015-02-20 DIAGNOSIS — M542 Cervicalgia: Secondary | ICD-10-CM

## 2015-02-20 DIAGNOSIS — Z720 Tobacco use: Secondary | ICD-10-CM | POA: Insufficient documentation

## 2015-02-20 DIAGNOSIS — Z862 Personal history of diseases of the blood and blood-forming organs and certain disorders involving the immune mechanism: Secondary | ICD-10-CM | POA: Insufficient documentation

## 2015-02-20 DIAGNOSIS — M5416 Radiculopathy, lumbar region: Secondary | ICD-10-CM | POA: Insufficient documentation

## 2015-02-20 MED ORDER — CYCLOBENZAPRINE HCL 10 MG PO TABS: 10.0000 mg | ORAL_TABLET | Freq: Three times a day (TID) | ORAL | Status: DC | PRN

## 2015-02-20 MED ORDER — PREDNISONE 50 MG PO TABS: 50.0000 mg | ORAL_TABLET | Freq: Every day | ORAL | Status: DC

## 2015-02-20 MED ORDER — HYDROCODONE-ACETAMINOPHEN 5-325 MG PO TABS: 1.0000 | ORAL_TABLET | Freq: Four times a day (QID) | ORAL | Status: DC | PRN
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
## 2015-02-20 NOTE — Discharge Instructions (Signed)
Return here as needed. Follow up with a primary doctor. Your x-rays were normal.

## 2015-04-11 ENCOUNTER — Emergency Department (HOSPITAL_COMMUNITY)
Admission: EM | Admit: 2015-04-11 | Discharge: 2015-04-11 | Disposition: A | Discharge status: Home / Self Care | Payer: No Typology Code available for payment source | Attending: Emergency Medicine | Admitting: Emergency Medicine

## 2015-04-11 ENCOUNTER — Encounter (HOSPITAL_COMMUNITY): Payer: Self-pay | Admitting: Emergency Medicine

## 2015-04-11 ENCOUNTER — Emergency Department (HOSPITAL_COMMUNITY): Payer: No Typology Code available for payment source

## 2015-04-11 DIAGNOSIS — Z79899 Other long term (current) drug therapy: Secondary | ICD-10-CM | POA: Diagnosis not present

## 2015-04-11 DIAGNOSIS — Z862 Personal history of diseases of the blood and blood-forming organs and certain disorders involving the immune mechanism: Secondary | ICD-10-CM | POA: Insufficient documentation

## 2015-04-11 DIAGNOSIS — M797 Fibromyalgia: Secondary | ICD-10-CM | POA: Diagnosis not present

## 2015-04-11 DIAGNOSIS — F17210 Nicotine dependence, cigarettes, uncomplicated: Secondary | ICD-10-CM | POA: Diagnosis not present

## 2015-04-11 DIAGNOSIS — M62838 Other muscle spasm: Secondary | ICD-10-CM

## 2015-04-11 DIAGNOSIS — R079 Chest pain, unspecified: Secondary | ICD-10-CM | POA: Diagnosis present

## 2015-04-11 LAB — BASIC METABOLIC PANEL
ANION GAP: 10 (ref 5–15)
BUN: 19 mg/dL (ref 6–20)
CHLORIDE: 106 mmol/L (ref 101–111)
CO2: 24 mmol/L (ref 22–32)
Calcium: 9.5 mg/dL (ref 8.9–10.3)
Creatinine, Ser: 0.83 mg/dL (ref 0.44–1.00)
GFR calc non Af Amer: >60 mL/min (ref >60)
Glucose, Bld: 101 mg/dL — ABNORMAL HIGH (ref 65–99)
Potassium: 4.1 mmol/L (ref 3.5–5.1)
SODIUM: 140 mmol/L (ref 135–145)

## 2015-04-11 LAB — URINALYSIS, ROUTINE W REFLEX MICROSCOPIC
GLUCOSE, UA: NEGATIVE mg/dL
Ketones, ur: NEGATIVE mg/dL
Nitrite: NEGATIVE
PH: 5 (ref 5.0–8.0)
Protein, ur: NEGATIVE mg/dL
SPECIFIC GRAVITY, URINE: 1.028 (ref 1.005–1.030)

## 2015-04-11 LAB — I-STAT TROPONIN, ED: TROPONIN I, POC: 0 ng/mL (ref 0.00–0.08)

## 2015-04-11 LAB — CBC
HCT: 43.4 % (ref 36.0–46.0)
HEMOGLOBIN: 13.9 g/dL (ref 12.0–15.0)
MCH: 27.4 pg (ref 26.0–34.0)
MCHC: 32 g/dL (ref 30.0–36.0)
MCV: 85.6 fL (ref 78.0–100.0)
Platelets: 294 10*3/uL (ref 150–400)
RBC: 5.07 MIL/uL (ref 3.87–5.11)
RDW: 13.8 % (ref 11.5–15.5)
WBC: 6.2 10*3/uL (ref 4.0–10.5)

## 2015-04-11 LAB — URINE MICROSCOPIC-ADD ON: RBC / HPF: NONE SEEN RBC/hpf (ref 0–5)

## 2015-04-11 LAB — D-DIMER, QUANTITATIVE: D-Dimer, Quant: 0.3 ug/mL-FEU (ref 0.00–0.50)

## 2015-04-11 MED ORDER — DIAZEPAM 5 MG PO TABS: 5.0000 mg | ORAL_TABLET | Freq: Two times a day (BID) | ORAL | Status: DC

## 2015-04-11 MED ORDER — DIAZEPAM 5 MG PO TABS
5.0000 mg | ORAL_TABLET | Freq: Once | ORAL | Status: AC
  Administered 2015-04-11: 5 mg via ORAL
  Filled 2015-04-11: qty 1

## 2015-04-11 MED ORDER — CYCLOBENZAPRINE HCL 10 MG PO TABS: 10.0000 mg | ORAL_TABLET | Freq: Two times a day (BID) | ORAL | Status: DC | PRN

## 2015-04-11 MED ORDER — PREDNISONE 20 MG PO TABS
60.0000 mg | ORAL_TABLET | Freq: Once | ORAL | Status: AC
  Administered 2015-04-11: 60 mg via ORAL
  Filled 2015-04-11: qty 3

## 2015-04-11 MED ORDER — NITROFURANTOIN MONOHYD MACRO 100 MG PO CAPS: 100.0000 mg | ORAL_CAPSULE | Freq: Two times a day (BID) | ORAL | Status: DC

## 2015-04-11 NOTE — ED Notes (Signed)
Delay in lab draw,  Pt in exray 

## 2015-04-11 NOTE — Discharge Instructions (Signed)
Please read attached information. If you experience any new or worsening signs or symptoms please return to the emergency room for evaluation. Please follow-up with your primary care provider or specialist as discussed. Please use medication prescribed only as directed and discontinue taking if you have any concerning signs or symptoms.   °

## 2015-04-11 NOTE — ED Notes (Signed)
Cough, SOB, chest pain worsening intermittently over the last 2 days. CP worse with cough and deep breathing. Breathing shallow, lung fields clear. Denies N/V/D, fever/chills.
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
## 2015-04-13 LAB — URINE CULTURE: Special Requests: NORMAL

## 2015-05-05 ENCOUNTER — Ambulatory Visit: Payer: Medicaid Other | Admitting: Family Medicine

## 2015-05-18 ENCOUNTER — Encounter (HOSPITAL_COMMUNITY): Payer: Self-pay | Admitting: Emergency Medicine

## 2015-05-18 ENCOUNTER — Emergency Department (HOSPITAL_COMMUNITY): Payer: No Typology Code available for payment source

## 2015-05-18 ENCOUNTER — Emergency Department (HOSPITAL_COMMUNITY)
Admission: EM | Admit: 2015-05-18 | Discharge: 2015-05-18 | Disposition: A | Discharge status: Home / Self Care | Payer: No Typology Code available for payment source | Attending: Emergency Medicine | Admitting: Emergency Medicine

## 2015-05-18 DIAGNOSIS — Z79899 Other long term (current) drug therapy: Secondary | ICD-10-CM | POA: Insufficient documentation

## 2015-05-18 DIAGNOSIS — Z87828 Personal history of other (healed) physical injury and trauma: Secondary | ICD-10-CM | POA: Diagnosis not present

## 2015-05-18 DIAGNOSIS — F17210 Nicotine dependence, cigarettes, uncomplicated: Secondary | ICD-10-CM | POA: Insufficient documentation

## 2015-05-18 DIAGNOSIS — I10 Essential (primary) hypertension: Secondary | ICD-10-CM

## 2015-05-18 DIAGNOSIS — Z862 Personal history of diseases of the blood and blood-forming organs and certain disorders involving the immune mechanism: Secondary | ICD-10-CM | POA: Insufficient documentation

## 2015-05-18 DIAGNOSIS — K439 Ventral hernia without obstruction or gangrene: Secondary | ICD-10-CM

## 2015-05-18 DIAGNOSIS — R079 Chest pain, unspecified: Secondary | ICD-10-CM | POA: Diagnosis not present

## 2015-05-18 LAB — BASIC METABOLIC PANEL
ANION GAP: 11 (ref 5–15)
BUN: 15 mg/dL (ref 6–20)
CALCIUM: 9.2 mg/dL (ref 8.9–10.3)
CO2: 26 mmol/L (ref 22–32)
Chloride: 106 mmol/L (ref 101–111)
Creatinine, Ser: 0.98 mg/dL (ref 0.44–1.00)
GLUCOSE: 127 mg/dL — AB (ref 65–99)
POTASSIUM: 3.9 mmol/L (ref 3.5–5.1)
SODIUM: 143 mmol/L (ref 135–145)

## 2015-05-18 LAB — CBC
HEMATOCRIT: 45.9 % (ref 36.0–46.0)
HEMOGLOBIN: 14.3 g/dL (ref 12.0–15.0)
MCH: 27.2 pg (ref 26.0–34.0)
MCHC: 31.2 g/dL (ref 30.0–36.0)
MCV: 87.4 fL (ref 78.0–100.0)
Platelets: 304 10*3/uL (ref 150–400)
RBC: 5.25 MIL/uL — AB (ref 3.87–5.11)
RDW: 14.4 % (ref 11.5–15.5)
WBC: 5.9 10*3/uL (ref 4.0–10.5)

## 2015-05-18 LAB — I-STAT TROPONIN, ED: TROPONIN I, POC: 0 ng/mL (ref 0.00–0.08)

## 2015-05-18 MED ORDER — HYDROCODONE-ACETAMINOPHEN 5-325 MG PO TABS: 1.0000 | ORAL_TABLET | ORAL | Status: DC | PRN

## 2015-05-18 MED ORDER — IOHEXOL 300 MG/ML  SOLN
50.0000 mL | Freq: Once | INTRAMUSCULAR | Status: DC | PRN
  Administered 2015-05-18: 50 mL via ORAL
  Filled 2015-05-18: qty 50

## 2015-05-18 MED ORDER — LISINOPRIL 20 MG PO TABS: 20.0000 mg | ORAL_TABLET | Freq: Every day | ORAL | Status: DC

## 2015-05-18 MED ORDER — IOHEXOL 300 MG/ML  SOLN
100.0000 mL | Freq: Once | INTRAMUSCULAR | Status: AC | PRN
  Administered 2015-05-18: 100 mL via INTRAVENOUS

## 2015-05-18 NOTE — ED Notes (Signed)
Pt reports intermittent chest pressure/grabbing onset Friday.
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
## 2015-05-18 NOTE — Discharge Instructions (Signed)
Hernia, Adult A hernia is the bulging of an organ or tissue through a weak spot in the muscles of the abdomen (abdominal wall). Hernias develop most often near the navel or groin. There are many kinds of hernias. Common kinds include:  Femoral hernia. This kind of hernia develops under the groin in the upper thigh area.  Inguinal hernia. This kind of hernia develops in the groin or scrotum.  Umbilical hernia. This kind of hernia develops near the navel.  Hiatal hernia. This kind of hernia causes part of the stomach to be pushed up into the chest.  Incisional hernia. This kind of hernia bulges through a scar from an abdominal surgery. CAUSES This condition may be caused by:  Heavy lifting.  Coughing over a long period of time.  Straining to have a bowel movement.  An incision made during an abdominal surgery.  A birth defect (congenital defect).  Excess weight or obesity.  Smoking.  Poor nutrition.  Cystic fibrosis.  Excess fluid in the abdomen.  Undescended testicles. SYMPTOMS Symptoms of a hernia include:  A lump on the abdomen. This is the first sign of a hernia. The lump may become more obvious with standing, straining, or coughing. It may get bigger over time if it is not treated or if the condition causing it is not treated.  Pain. A hernia is usually painless, but it may become painful over time if treatment is delayed. The pain is usually dull and may get worse with standing or lifting heavy objects. Sometimes a hernia gets tightly squeezed in the weak spot (strangulated) or stuck there (incarcerated) and causes additional symptoms. These symptoms may include:  Vomiting.  Nausea.  Constipation.  Irritability. DIAGNOSIS A hernia may be diagnosed with:  A physical exam. During the exam your health care provider may ask you to cough or to make a specific movement, because a hernia is usually more visible when you move.  Imaging tests. These can  include:  X-rays.  Ultrasound.  CT scan. TREATMENT A hernia that is small and painless may not need to be treated. A hernia that is large or painful may be treated with surgery. Inguinal hernias may be treated with surgery to prevent incarceration or strangulation. Strangulated hernias are always treated with surgery, because lack of blood to the trapped organ or tissue can cause it to die. Surgery to treat a hernia involves pushing the bulge back into place and repairing the weak part of the abdomen. HOME CARE INSTRUCTIONS  Avoid straining.  Do not lift anything heavier than 10 lb (4.5 kg).  Lift with your leg muscles, not your back muscles. This helps avoid strain.  When coughing, try to cough gently.  Prevent constipation. Constipation leads to straining with bowel movements, which can make a hernia worse or cause a hernia repair to break down. You can prevent constipation by:  Eating a high-fiber diet that includes plenty of fruits and vegetables.  Drinking enough fluids to keep your urine clear or pale yellow. Aim to drink 6-8 glasses of water per day.  Using a stool softener as directed by your health care provider.  Lose weight, if you are overweight.  Do not use any tobacco products, including cigarettes, chewing tobacco, or electronic cigarettes. If you need help quitting, ask your health care provider.  Keep all follow-up visits as directed by your health care provider. This is important. Your health care provider may need to monitor your condition. SEEK MEDICAL CARE IF:  You have  swelling, redness, and pain in the affected area.  Your bowel habits change. SEEK IMMEDIATE MEDICAL CARE IF:  You have a fever.  You have abdominal pain that is getting worse.  You feel nauseous or you vomit.  You cannot push the hernia back in place by gently pressing on it while you are lying down.  The hernia:  Changes in shape or size.  Is stuck outside the  abdomen.  Becomes discolored.  Feels hard or tender.   This information is not intended to replace advice given to you by your health care provider. Make sure you discuss any questions you have with your health care provider.   Document Released: 04/18/2005 Document Revised: 05/09/2014 Document Reviewed: 02/26/2014 Elsevier Interactive Patient Education 2016 [employer].  Hypertension Hypertension, commonly called high blood pressure, is when the force of blood pumping through your arteries is too strong. Your arteries are the blood vessels that carry blood from your heart throughout your body. A blood pressure reading consists of a higher number over a lower number, such as 110/72. The higher number (systolic) is the pressure inside your arteries when your heart pumps. The lower number (diastolic) is the pressure inside your arteries when your heart relaxes. Ideally you want your blood pressure below 120/80. Hypertension forces your heart to work harder to pump blood. Your arteries may become narrow or stiff. Having untreated or uncontrolled hypertension can cause heart attack, stroke, kidney disease, and other problems. RISK FACTORS Some risk factors for high blood pressure are controllable. Others are not.  Risk factors you cannot control include:   Race. You may be at higher risk if you are African American.  Age. Risk increases with age.  Gender. Men are at higher risk than women before age 63 years. After age 53, women are at higher risk than men. Risk factors you can control include:  Not getting enough exercise or physical activity.  Being overweight.  Getting too much fat, sugar, calories, or salt in your diet.  Drinking too much alcohol. SIGNS AND SYMPTOMS Hypertension does not usually cause signs or symptoms. Extremely high blood pressure (hypertensive crisis) may cause headache, anxiety, shortness of breath, and nosebleed. DIAGNOSIS To check if you have hypertension,  your health care provider will measure your blood pressure while you are seated, with your arm held at the level of your heart. It should be measured at least twice using the same arm. Certain conditions can cause a difference in blood pressure between your right and left arms. A blood pressure reading that is higher than normal on one occasion does not mean that you need treatment. If it is not clear whether you have high blood pressure, you may be asked to return on a different day to have your blood pressure checked again. Or, you may be asked to monitor your blood pressure at home for 1 or more weeks. TREATMENT Treating high blood pressure includes making lifestyle changes and possibly taking medicine. Living a healthy lifestyle can help lower high blood pressure. You may need to change some of your habits. Lifestyle changes may include:  Following the DASH diet. This diet is high in fruits, vegetables, and whole grains. It is low in salt, red meat, and added sugars.  Keep your sodium intake below 2,300 mg per day.  Getting at least 30-45 minutes of aerobic exercise at least 4 times per week.  Losing weight if necessary.  Not smoking.  Limiting alcoholic beverages.  Learning ways to reduce stress.  Your health care provider may prescribe medicine if lifestyle changes are not enough to get your blood pressure under control, and if one of the following is true:  You are 68-9 years of age and your systolic blood pressure is above 140.  You are 41 years of age or older, and your systolic blood pressure is above 150.  Your diastolic blood pressure is above 90.  You have diabetes, and your systolic blood pressure is over [id number] or your diastolic blood pressure is over 90.  You have kidney disease and your blood pressure is above 140/90.  You have heart disease and your blood pressure is above 140/90. Your personal target blood pressure may vary depending on your medical conditions, your  age, and other factors. HOME CARE INSTRUCTIONS  Have your blood pressure rechecked as directed by your health care provider.   Take medicines only as directed by your health care provider. Follow the directions carefully. Blood pressure medicines must be taken as prescribed. The medicine does not work as well when you skip doses. Skipping doses also puts you at risk for problems.  Do not smoke.   Monitor your blood pressure at home as directed by your health care provider. SEEK MEDICAL CARE IF:   You think you are having a reaction to medicines taken.  You have recurrent headaches or feel dizzy.  You have swelling in your ankles.  You have trouble with your vision. SEEK IMMEDIATE MEDICAL CARE IF:  You develop a severe headache or confusion.  You have unusual weakness, numbness, or feel faint.  You have severe chest or abdominal pain.  You vomit repeatedly.  You have trouble breathing. MAKE SURE YOU:   Understand these instructions.  Will watch your condition.  Will get help right away if you are not doing well or get worse.   This information is not intended to replace advice given to you by your health care provider. Make sure you discuss any questions you have with your health care provider.   Document Released: 04/18/2005 Document Revised: 09/02/2014 Document Reviewed: 02/08/2013 Elsevier Interactive Patient Education 2016 [employer]  Nonspecific Chest Pain  Chest pain can be caused by many different conditions. There is always a chance that your pain could be related to something serious, such as a heart attack or a blood clot in your lungs. Chest pain can also be caused by conditions that are not life-threatening. If you have chest pain, it is very important to follow up with your health care provider. CAUSES  Chest pain can be caused by:  Heartburn.  Pneumonia or bronchitis.  Anxiety or stress.  Inflammation around your heart (pericarditis) or lung  (pleuritis or pleurisy).  A blood clot in your lung.  A collapsed lung (pneumothorax). It can develop suddenly on its own (spontaneous pneumothorax) or from trauma to the chest.  Shingles infection (varicella-zoster virus).  Heart attack.  Damage to the bones, muscles, and cartilage that make up your chest wall. This can include:  Bruised bones due to injury.  Strained muscles or cartilage due to frequent or repeated coughing or overwork.  Fracture to one or more ribs.  Sore cartilage due to inflammation (costochondritis). RISK FACTORS  Risk factors for chest pain may include:  Activities that increase your risk for trauma or injury to your chest.  Respiratory infections or conditions that cause frequent coughing.  Medical conditions or overeating that can cause heartburn.  Heart disease or family history of heart disease.  Conditions or  health behaviors that increase your risk of developing a blood clot.  Having had chicken pox (varicella zoster). SIGNS AND SYMPTOMS Chest pain can feel like:  Burning or tingling on the surface of your chest or deep in your chest.  Crushing, pressure, aching, or squeezing pain.  Dull or sharp pain that is worse when you move, cough, or take a deep breath.  Pain that is also felt in your back, neck, shoulder, or arm, or pain that spreads to any of these areas. Your chest pain may come and go, or it may stay constant. DIAGNOSIS Lab tests or other studies may be needed to find the cause of your pain. Your health care provider may have you take a test called an ambulatory ECG (electrocardiogram). An ECG records your heartbeat patterns at the time the test is performed. You may also have other tests, such as:  Transthoracic echocardiogram (TTE). During echocardiography, sound waves are used to create a picture of all of the heart structures and to look at how blood flows through your heart.  Transesophageal echocardiogram (TEE).This is a  more advanced imaging test that obtains images from inside your body. It allows your health care provider to see your heart in finer detail.  Cardiac monitoring. This allows your health care provider to monitor your heart rate and rhythm in real time.  Holter monitor. This is a portable device that records your heartbeat and can help to diagnose abnormal heartbeats. It allows your health care provider to track your heart activity for several days, if needed.  Stress tests. These can be done through exercise or by taking medicine that makes your heart beat more quickly.  Blood tests.  Imaging tests. TREATMENT  Your treatment depends on what is causing your chest pain. Treatment may include:  Medicines. These may include:  Acid blockers for heartburn.  Anti-inflammatory medicine.  Pain medicine for inflammatory conditions.  Antibiotic medicine, if an infection is present.  Medicines to dissolve blood clots.  Medicines to treat coronary artery disease.  Supportive care for conditions that do not require medicines. This may include:  Resting.  Applying heat or cold packs to injured areas.  Limiting activities until pain decreases. HOME CARE INSTRUCTIONS  If you were prescribed an antibiotic medicine, finish it all even if you start to feel better.  Avoid any activities that bring on chest pain.  Do not use any tobacco products, including cigarettes, chewing tobacco, or electronic cigarettes. If you need help quitting, ask your health care provider.  Do not drink alcohol.  Take medicines only as directed by your health care provider.  Keep all follow-up visits as directed by your health care provider. This is important. This includes any further testing if your chest pain does not go away.  If heartburn is the cause for your chest pain, you may be told to keep your head raised (elevated) while sleeping. This reduces the chance that acid will go from your stomach into your  esophagus.  Make lifestyle changes as directed by your health care provider. These may include:  Getting regular exercise. Ask your health care provider to suggest some activities that are safe for you.  Eating a heart-healthy diet. A registered dietitian can help you to learn healthy eating options.  Maintaining a healthy weight.  Managing diabetes, if necessary.  Reducing stress. SEEK MEDICAL CARE IF:  Your chest pain does not go away after treatment.  You have a rash with blisters on your chest.  You have  a fever. SEEK IMMEDIATE MEDICAL CARE IF:   Your chest pain is worse.  You have an increasing cough, or you cough up blood.  You have severe abdominal pain.  You have severe weakness.  You faint.  You have chills.  You have sudden, unexplained chest discomfort.  You have sudden, unexplained discomfort in your arms, back, neck, or jaw.  You have shortness of breath at any time.  You suddenly start to sweat, or your skin gets clammy.  You feel nauseous or you vomit.  You suddenly feel light-headed or dizzy.  Your heart begins to beat quickly, or it feels like it is skipping beats. These symptoms may represent a serious problem that is an emergency. Do not wait to see if the symptoms will go away. Get medical help right away. Call your local emergency services (911 in the [place] Do not drive yourself to the hospital.   This information is not intended to replace advice given to you by your health care provider. Make sure you discuss any questions you have with your health care provider.   Document Released: 01/26/2005 Document Revised: 05/09/2014 Document Reviewed: 11/22/2013 Elsevier Interactive Patient Education [employer].

## 2015-05-18 NOTE — ED Notes (Signed)
Pt reports mid-abd pain intermittently x 1 year.  States she thinks that her inguinal hernia's recurred.  Reports having it repaired 2 years ago.  Denies any n/v at this time.
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
## 2015-05-18 NOTE — ED Notes (Signed)
Bed: WTR7 Expected date:  Expected time:  Means of arrival:  Comments: lab

## 2015-05-26 ENCOUNTER — Ambulatory Visit: Payer: Medicaid Other | Admitting: Family Medicine

## 2015-05-28 ENCOUNTER — Telehealth (HOSPITAL_BASED_OUTPATIENT_CLINIC_OR_DEPARTMENT_OTHER): Payer: Self-pay | Admitting: Emergency Medicine

## 2015-06-05 ENCOUNTER — Ambulatory Visit: Payer: Medicaid Other | Admitting: Family Medicine

## 2015-06-13 ENCOUNTER — Inpatient Hospital Stay (HOSPITAL_COMMUNITY)
Admission: AD | Admit: 2015-06-13 | Discharge: 2015-06-13 | Disposition: A | Discharge status: Home / Self Care | Payer: No Typology Code available for payment source | Source: Ambulatory Visit | Attending: Family Medicine | Admitting: Family Medicine

## 2015-06-13 ENCOUNTER — Encounter (HOSPITAL_COMMUNITY): Payer: Self-pay | Admitting: *Deleted

## 2015-06-13 DIAGNOSIS — J069 Acute upper respiratory infection, unspecified: Secondary | ICD-10-CM | POA: Insufficient documentation

## 2015-06-13 DIAGNOSIS — M797 Fibromyalgia: Secondary | ICD-10-CM | POA: Insufficient documentation

## 2015-06-13 DIAGNOSIS — Z7982 Long term (current) use of aspirin: Secondary | ICD-10-CM | POA: Insufficient documentation

## 2015-06-13 DIAGNOSIS — F17210 Nicotine dependence, cigarettes, uncomplicated: Secondary | ICD-10-CM | POA: Insufficient documentation

## 2015-06-13 LAB — URINALYSIS, ROUTINE W REFLEX MICROSCOPIC
BILIRUBIN URINE: NEGATIVE
Glucose, UA: NEGATIVE mg/dL
KETONES UR: NEGATIVE mg/dL
LEUKOCYTES UA: NEGATIVE
NITRITE: NEGATIVE
Protein, ur: NEGATIVE mg/dL
pH: 5.5 (ref 5.0–8.0)

## 2015-06-13 LAB — URINE MICROSCOPIC-ADD ON

## 2015-06-13 MED ORDER — BENZONATATE 100 MG PO CAPS: 100.0000 mg | ORAL_CAPSULE | Freq: Three times a day (TID) | ORAL | Status: DC | PRN

## 2015-06-13 MED ORDER — AZITHROMYCIN 250 MG PO TABS: ORAL_TABLET | ORAL | Status: AC

## 2015-06-13 NOTE — MAU Note (Signed)
Patient presents to MAU with complaint of cough for 3 weeks, now brining up yellow sputum, also having abdominal pressure, stopped having periods for at least 2 years.

## 2015-06-13 NOTE — MAU Provider Note (Signed)
History     CSN: [id number]  Arrival date and time: 06/13/15 1555   None     Chief Complaint  Patient presents with  . Cough  . Hemoptysis    yellow   Cough Pertinent negatives include no chest pain, fever, shortness of breath or wheezing.    Robin Pace 50 y.o. [id number] presents to MAU for cough, congestion and phlegm x 3 weeks. She came here because she thought she would get in quicker.  Past Medical History  Diagnosis Date  . Anemia   . Medical history non-contributory   . Wears dentures     top  . Headache   . Numbness on right side   . Fibromyalgia     Past Surgical History  Procedure Laterality Date  . Tubal ligation    . Cesarean section    . Incise and drain abcess  2005    rt middle finger   . Mass excision Right 08/06/2012    Procedure: EXCISION OF LARGE MASS right FLANK WITH LIPO ASSISTANCE;  Surgeon: Cristine Polio, MD;  Location: [facility];  Service: Plastics;  Laterality: Right;  . 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 Hernia repair      Family History  Problem Relation Age of Onset  . Hypertension Mother   . Heart disease Father   . Heart disease Brother     Social History  Substance Use Topics  . Smoking status: Current Every Day Smoker -- 0.20 packs/day    Types: Cigarettes  . Smokeless tobacco: Never Used  . Alcohol Use: Yes     Comment: occassional    Allergies: No Known Allergies  Prescriptions prior to admission  Medication Sig Dispense Refill Last Dose  . aspirin EC 81 MG tablet Take 81 mg by mouth daily as needed for mild pain.   05/17/2015 at Unknown time  . HYDROcodone-acetaminophen (NORCO) 5-325 MG tablet Take 1 tablet by mouth every 4 (four) hours as needed. 20 tablet 0   . lisinopril (PRINIVIL,ZESTRIL) 20 MG tablet Take 1 tablet (20  mg total) by mouth daily. 30 tablet 0   . predniSONE (DELTASONE) 50 MG tablet Take 1 tablet (50 mg total) by mouth daily. (Patient not taking: Reported on 04/11/2015) 5 tablet 0 Not Taking at Unknown time  . pregabalin (LYRICA) 75 MG capsule Take 75 mg by mouth 3 (three) times daily. Reported on 05/18/2015   Not Taking at Unknown time    Review of Systems  Constitutional: Negative for fever.  Respiratory: Positive for cough and sputum production. Negative for shortness of breath and wheezing.   Cardiovascular: Negative for chest pain.   Physical Exam   Blood pressure 156/93, pulse 94, temperature 98.8 F (37.1 C), temperature source Oral, resp. rate 20, height 5' 8.25" (1.734 m), weight 84.732 kg (186 lb 12.8 oz), last menstrual period 05/08/2013.  Physical Exam  Nursing note and vitals reviewed. Constitutional: She is oriented to person, place, and time. She appears well-developed and well-nourished. No distress.  HENT:  Head: Normocephalic and atraumatic.  Neck: Normal range of motion.  Cardiovascular: Normal rate.   Respiratory: Effort normal and breath sounds normal.  GI: Soft.  Musculoskeletal: Normal range of motion.  Neurological: She is alert and oriented to person, place, and time.  Skin: Skin is warm and dry.  Psychiatric:  She has a normal mood and affect. Her behavior is normal. Judgment and thought content normal.    MAU Course  Procedures  MDM Will give Z pac and Tessalon perls; Pt should continue Mucinex  Assessment and Plan  URI ZPac Tessalon Perls #30 Discharge   Clemmons,Lori Grissett 06/13/2015, 4:46 PM

## 2015-06-13 NOTE — Discharge Instructions (Signed)
Cool Mist Vaporizers Vaporizers may help relieve the symptoms of a cough and cold. They add moisture to the air, which helps mucus to become thinner and less sticky. This makes it easier to breathe and cough up secretions. Cool mist vaporizers do not cause serious burns like hot mist vaporizers, which may also be called steamers or humidifiers. Vaporizers have not been proven to help with colds. You should not use a vaporizer if you are allergic to mold. HOME CARE INSTRUCTIONS  Follow the package instructions for the vaporizer.  Do not use anything other than distilled water in the vaporizer.  Do not run the vaporizer all of the time. This can cause mold or bacteria to grow in the vaporizer.  Clean the vaporizer after each time it is used.  Clean and dry the vaporizer well before storing it.  Stop using the vaporizer if worsening respiratory symptoms develop.   This information is not intended to replace advice given to you by your health care provider. Make sure you discuss any questions you have with your health care provider.   Document Released: 01/14/2004 Document Revised: 04/23/2013 Document Reviewed: 09/05/2012 Elsevier Interactive Patient Education 2016 Elsevier Inc.  Cough, Adult A cough helps to clear your throat and lungs. A cough may last only 2-3 weeks (acute), or it may last longer than 8 weeks (chronic). Many different things can cause a cough. A cough may be a sign of an illness or another medical condition. HOME CARE  Pay attention to any changes in your cough.  Take medicines only as told by your doctor.  If you were prescribed an antibiotic medicine, take it as told by your doctor. Do not stop taking it even if you start to feel better.  Talk with your doctor before you try using a cough medicine.  Drink enough fluid to keep your pee (urine) clear or pale yellow.  If the air is dry, use a cold steam vaporizer or humidifier in your home.  Stay away from things  that make you cough at work or at home.  If your cough is worse at night, try using extra pillows to raise your head up higher while you sleep.  Do not smoke, and try not to be around smoke. If you need help quitting, ask your doctor.  Do not have caffeine.  Do not drink alcohol.  Rest as needed. GET HELP IF:  You have new problems (symptoms).  You cough up yellow fluid (pus).  Your cough does not get better after 2-3 weeks, or your cough gets worse.  Medicine does not help your cough and you are not sleeping well.  You have pain that gets worse or pain that is not helped with medicine.  You have a fever.  You are losing weight and you do not know why.  You have night sweats. GET HELP RIGHT AWAY IF:  You cough up blood.  You have trouble breathing.  Your heartbeat is very fast.   This information is not intended to replace advice given to you by your health care provider. Make sure you discuss any questions you have with your health care provider.   Document Released: 12/30/2010 Document Revised: 01/07/2015 Document Reviewed: 06/25/2014 Elsevier Interactive Patient Education 2016 Elsevier Inc.  Upper Respiratory Infection, Adult Most upper respiratory infections (URIs) are a viral infection of the air passages leading to the lungs. A URI affects the nose, throat, and upper air passages. The most common type of URI is nasopharyngitis and  is typically referred to as "the common cold." URIs run their course and usually go away on their own. Most of the time, a URI does not require medical attention, but sometimes a bacterial infection in the upper airways can follow a viral infection. This is called a secondary infection. Sinus and middle ear infections are common types of secondary upper respiratory infections. Bacterial pneumonia can also complicate a URI. A URI can worsen asthma and chronic obstructive pulmonary disease (COPD). Sometimes, these complications can require  emergency medical care and may be life threatening.  CAUSES Almost all URIs are caused by viruses. A virus is a type of germ and can spread from one person to another.  RISKS FACTORS You may be at risk for a URI if:   You smoke.   You have chronic heart or lung disease.  You have a weakened defense (immune) system.   You are very young or very old.   You have nasal allergies or asthma.  You work in crowded or poorly ventilated areas.  You work in health care facilities or schools. SIGNS AND SYMPTOMS  Symptoms typically develop 2-3 days after you come in contact with a cold virus. Most viral URIs last 7-10 days. However, viral URIs from the influenza virus (flu virus) can last 14-18 days and are typically more severe. Symptoms may include:   Runny or stuffy (congested) nose.   Sneezing.   Cough.   Sore throat.   Headache.   Fatigue.   Fever.   Loss of appetite.   Pain in your forehead, behind your eyes, and over your cheekbones (sinus pain).  Muscle aches.  DIAGNOSIS  Your health care provider may diagnose a URI by:  Physical exam.  Tests to check that your symptoms are not due to another condition such as:  Strep throat.  Sinusitis.  Pneumonia.  Asthma. TREATMENT  A URI goes away on its own with time. It cannot be cured with medicines, but medicines may be prescribed or recommended to relieve symptoms. Medicines may help:  Reduce your fever.  Reduce your cough.  Relieve nasal congestion. HOME CARE INSTRUCTIONS   Take medicines only as directed by your health care provider.   Gargle warm saltwater or take cough drops to comfort your throat as directed by your health care provider.  Use a warm mist humidifier or inhale steam from a shower to increase air moisture. This may make it easier to breathe.  Drink enough fluid to keep your urine clear or pale yellow.   Eat soups and other clear broths and maintain good nutrition.   Rest  as needed.   Return to work when your temperature has returned to normal or as your health care provider advises. You may need to stay home longer to avoid infecting others. You can also use a face mask and careful hand washing to prevent spread of the virus.  Increase the usage of your inhaler if you have asthma.   Do not use any tobacco products, including cigarettes, chewing tobacco, or electronic cigarettes. If you need help quitting, ask your health care provider. PREVENTION  The best way to protect yourself from getting a cold is to practice good hygiene.   Avoid oral or hand contact with people with cold symptoms.   Wash your hands often if contact occurs.  There is no clear evidence that vitamin C, vitamin E, echinacea, or exercise reduces the chance of developing a cold. However, it is always recommended to get plenty  of rest, exercise, and practice good nutrition.  SEEK MEDICAL CARE IF:   You are getting worse rather than better.   Your symptoms are not controlled by medicine.   You have chills.  You have worsening shortness of breath.  You have brown or red mucus.  You have yellow or brown nasal discharge.  You have pain in your face, especially when you bend forward.  You have a fever.  You have swollen neck glands.  You have pain while swallowing.  You have white areas in the back of your throat. SEEK IMMEDIATE MEDICAL CARE IF:   You have severe or persistent:  Headache.  Ear pain.  Sinus pain.  Chest pain.  You have chronic lung disease and any of the following:  Wheezing.  Prolonged cough.  Coughing up blood.  A change in your usual mucus.  You have a stiff neck.  You have changes in your:  Vision.  Hearing.  Thinking.  Mood. MAKE SURE YOU:   Understand these instructions.  Will watch your condition.  Will get help right away if you are not doing well or get worse.   This information is not intended to replace advice  given to you by your health care provider. Make sure you discuss any questions you have with your health care provider.   Document Released: 10/12/2000 Document Revised: 09/02/2014 Document Reviewed: 07/24/2013 Elsevier Interactive Patient Education [employer].

## 2015-06-13 NOTE — MAU Note (Signed)
Patient has elevated blood pressure and given script for Lisinopril has not filled, encouraged to do so.

## 2015-06-16 ENCOUNTER — Encounter (HOSPITAL_COMMUNITY): Payer: Self-pay | Admitting: Emergency Medicine

## 2015-06-16 ENCOUNTER — Emergency Department (HOSPITAL_COMMUNITY): Payer: No Typology Code available for payment source

## 2015-06-16 ENCOUNTER — Emergency Department (HOSPITAL_COMMUNITY)
Admission: EM | Admit: 2015-06-16 | Discharge: 2015-06-16 | Disposition: A | Discharge status: Home / Self Care | Payer: No Typology Code available for payment source | Attending: Emergency Medicine | Admitting: Emergency Medicine

## 2015-06-16 ENCOUNTER — Other Ambulatory Visit: Payer: Self-pay

## 2015-06-16 DIAGNOSIS — R079 Chest pain, unspecified: Secondary | ICD-10-CM | POA: Insufficient documentation

## 2015-06-16 DIAGNOSIS — F17210 Nicotine dependence, cigarettes, uncomplicated: Secondary | ICD-10-CM | POA: Insufficient documentation

## 2015-06-16 LAB — BASIC METABOLIC PANEL
Anion gap: 11 (ref 5–15)
BUN: 17 mg/dL (ref 6–20)
CHLORIDE: 110 mmol/L (ref 101–111)
CO2: 24 mmol/L (ref 22–32)
Calcium: 9.5 mg/dL (ref 8.9–10.3)
Creatinine, Ser: 0.77 mg/dL (ref 0.44–1.00)
GFR calc non Af Amer: >60 mL/min (ref >60)
Glucose, Bld: 130 mg/dL — ABNORMAL HIGH (ref 65–99)
POTASSIUM: 4 mmol/L (ref 3.5–5.1)
SODIUM: 145 mmol/L (ref 135–145)

## 2015-06-16 LAB — I-STAT TROPONIN, ED: Troponin i, poc: 0 ng/mL (ref 0.00–0.08)

## 2015-06-16 LAB — CBC
HEMATOCRIT: 40.6 % (ref 36.0–46.0)
Hemoglobin: 12.7 g/dL (ref 12.0–15.0)
MCH: 26.8 pg (ref 26.0–34.0)
MCHC: 31.3 g/dL (ref 30.0–36.0)
MCV: 85.7 fL (ref 78.0–100.0)
PLATELETS: 258 10*3/uL (ref 150–400)
RBC: 4.74 MIL/uL (ref 3.87–5.11)
RDW: 14.2 % (ref 11.5–15.5)
WBC: 6.8 10*3/uL (ref 4.0–10.5)

## 2015-06-16 NOTE — ED Notes (Signed)
CP worsening today. Endorses CP >3 days. Hx of high blood pressure. Seen at [facility] on 1/16. Prescribed ABX and medications for HBP. States that she has not been able to fill these Rx

## 2015-06-16 NOTE — ED Notes (Signed)
Pt at desk stating she wants to leave, pt encouraged to stay, lab work and test results reviewed prior to patient leaving, pt ambulatory without distress

## 2015-06-17 ENCOUNTER — Emergency Department (HOSPITAL_COMMUNITY)
Admission: EM | Admit: 2015-06-17 | Discharge: 2015-06-17 | Disposition: A | Discharge status: Home / Self Care | Payer: No Typology Code available for payment source | Attending: Emergency Medicine | Admitting: Emergency Medicine

## 2015-06-17 ENCOUNTER — Encounter (HOSPITAL_COMMUNITY): Payer: Self-pay | Admitting: Family Medicine

## 2015-06-17 DIAGNOSIS — Z8739 Personal history of other diseases of the musculoskeletal system and connective tissue: Secondary | ICD-10-CM | POA: Insufficient documentation

## 2015-06-17 DIAGNOSIS — R05 Cough: Secondary | ICD-10-CM | POA: Insufficient documentation

## 2015-06-17 DIAGNOSIS — Z792 Long term (current) use of antibiotics: Secondary | ICD-10-CM | POA: Insufficient documentation

## 2015-06-17 DIAGNOSIS — F17210 Nicotine dependence, cigarettes, uncomplicated: Secondary | ICD-10-CM | POA: Insufficient documentation

## 2015-06-17 DIAGNOSIS — Z862 Personal history of diseases of the blood and blood-forming organs and certain disorders involving the immune mechanism: Secondary | ICD-10-CM | POA: Insufficient documentation

## 2015-06-17 DIAGNOSIS — R0789 Other chest pain: Secondary | ICD-10-CM | POA: Insufficient documentation

## 2015-06-17 DIAGNOSIS — R059 Cough, unspecified: Secondary | ICD-10-CM

## 2015-06-17 MED ORDER — LISINOPRIL 20 MG PO TABS: 20.0000 mg | ORAL_TABLET | Freq: Every day | ORAL | Status: DC

## 2015-06-17 NOTE — ED Provider Notes (Signed)
CSN: [id number]     Arrival date & time 06/17/15  [id number] History  By signing my name below, I, Robin Pace, attest that this documentation has been prepared under the direction and in the presence of Robin Olesen, PA-C. Electronically Signed: Starleen Pace ED Scribe. 06/17/2015. 9:56 AM.    Chief Complaint  Patient presents with  . Cough   The history is provided by the patient. No language interpreter was used.   HPI Comments: Robin Pace is a 50 y.o. female who presents to the Emergency Department complaining of an unchanged cough productive of yellow sputum onset 1 month ago. She also complains of CP that is present with her cough and palpation to the anterior chest wall. Her chest pain is not exertional or associated with dizziness, diaphoresis, syncope, shortness of breath or nausea. She states her symptoms have been unchanged for the past month. The patient reports she has been evaluated multiple times for both complaints. She left the ED last night after having labs drawn and CXR due to the wait time. She was rx'd antibiotics several days ago after being evaluated at [facility] hospital but was unable to pick these up due to cost. She returns today to see if she can be given the prescriptions in the ED instead of picking them up at a pharmacy. She also notes she was prescribed lisinopril for HTN but has not picked these up either. She states she is looking for a new PCP due to insurance issues. She has no other complaints today.   PCP: none due to insurance complications Chart review: pt CXR from last evening without active disease  Past Medical History  Diagnosis Date  . Anemia   . Medical history non-contributory   . Wears dentures     top  . Headache   . Numbness on right side   . Fibromyalgia    Past Surgical History  Procedure Laterality Date  . Tubal ligation    . Cesarean section    . Incise and drain abcess  2005    rt middle finger   . Mass excision Right 08/06/2012     Procedure: EXCISION OF LARGE MASS right FLANK WITH LIPO ASSISTANCE;  Surgeon: Cristine Polio, MD;  Location: [place];  Service: Plastics;  Laterality: Right;  . 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 Hernia repair     Family History  Problem Relation Age of Onset  . Hypertension Mother   . Heart disease Father   . Heart disease Brother    Social History  Substance Use Topics  . Smoking status: Current Every Day Smoker -- 0.20 packs/day    Types: Cigarettes  . Smokeless tobacco: Never Used  . Alcohol Use: Yes     Comment: occassional   OB History    Gravida Para Term Preterm AB TAB SAB Ectopic Multiple Living   2 2 1 1      2      Review of Systems  Respiratory: Positive for cough.   Cardiovascular: Positive for chest pain (with cough and to palpation).  All other systems reviewed and are negative.  A complete 10 system review of systems was obtained and all systems are negative except as noted in the HPI and PMH.   Allergies  Review of patient's allergies indicates no known allergies.  Home  Medications   Prior to Admission medications   Medication Sig Start Date End Date Taking? Authorizing Provider  azithromycin (ZITHROMAX Z-PAK) 250 MG tablet Take 2 tablets (500 mg) on  Day 1,  followed by 1 tablet (250 mg) once daily on Days 2 through 5. 06/13/15 06/18/15  Lori A Clemmons, CNM  benzonatate (TESSALON PERLES) 100 MG capsule Take 1 capsule (100 mg total) by mouth 3 (three) times daily as needed for cough. 06/13/15 06/12/16  Lori A Clemmons, CNM  lisinopril (PRINIVIL,ZESTRIL) 20 MG tablet Take 1 tablet (20 mg total) by mouth daily. Patient not taking: Reported on 06/13/2015 05/18/15   Daleen Bo, MD  lisinopril (PRINIVIL,ZESTRIL) 20 MG tablet Take 1 tablet (20 mg total) by mouth daily.  06/17/15   Tenise Stetler, PA-C   BP 141/101 mmHg  Pulse 86  Temp(Src) 98 F (36.7 C) (Oral)  Resp 81  SpO2 100%  LMP 05/08/2013 Physical Exam  Constitutional: She is oriented to person, place, and time. She appears well-developed and well-nourished. No distress.  HENT:  Head: Normocephalic and atraumatic.  Eyes: Conjunctivae and EOM are normal.  Neck: Neck supple. No tracheal deviation present.  Cardiovascular: Normal rate, regular rhythm and normal heart sounds.   Pulmonary/Chest: Effort normal and breath sounds normal. No respiratory distress. She has no wheezes. She has no rales. She exhibits tenderness.  Generalized TTP over anterior chest wall. No bony deformities. No retractions, flail or crepitus. Breathing unlabored with symmetric chest expansion. Lungs CTAB.   Musculoskeletal: Normal range of motion.  Neurological: She is alert and oriented to person, place, and time.  Skin: Skin is warm and dry.  Psychiatric: She has a normal mood and affect. Her behavior is normal.  Nursing note and vitals reviewed.   ED Course  Procedures (including critical care time)  DIAGNOSTIC STUDIES: Oxygen Saturation is 100% on RA, normal by my interpretation.    COORDINATION OF CARE:  11:57 AM Discussed treatment plan with patient at bedside.  Patient acknowledges and agrees with plan.    Labs Review Labs Reviewed - No data to display  Imaging Review Dg Chest 2 View  06/16/2015  CLINICAL DATA:  50 year old female with 3 day history of left chest pain EXAM: CHEST  2 VIEW COMPARISON:  Prior chest x-ray 05/18/2015 FINDINGS: Stable mild cardiomegaly. Trace atherosclerotic calcification in the transverse aorta. No focal airspace consolidation, pulmonary edema, pleural effusion or pneumothorax. Relative prominence of the basilar interstitial markings likely secondary to superimposition of overlying soft tissues. Cardiac electrode artifacts project over the right upper lung. No acute osseous  abnormality. IMPRESSION: No active cardiopulmonary disease. Electronically Signed   By: Jacqulynn Cadet M.D.   On: 06/16/2015 21:19   I have personally reviewed and evaluated these images and lab results as part of my medical decision-making.   EKG Interpretation None      MDM   Final diagnoses:  Cough   50 year old female presenting with cough and reproducible chest pain 1 month. She is evaluated by [facility] and prescribed Z-Pak, Tessalon. She has not been able to fill these medications due to cost and states she has lost her lisinopril prescription. She denies change in her symptoms today. She is requesting to receive her medications here. Hypertensive to 156/93. Patient is nontoxic-appearing. Lungs clear to auscultation bilaterally. Generalized tenderness over the anterior chest wall without bony deformity. Chart review shows she was seen in the waiting room yesterday with a negative chest x-ray and unremarkable blood work. She chose to leave  due to wait time. No indication for further imaging or blood work today. Patient was unaware of the [facility] $4 list which we discussed at length with her. She states she will take her antibiotic and Tessalon prescription there today but has lost her lisinopril prescription. I have rewritten her lisinopril prescription for her. Symptoms consistent with bronchitis which will be treated with her Z-Pak. Encouraged close PCP follow-up for blood pressure recheck and to ensure symptoms are resolving. Return precautions given in discharge paperwork and discussed with pt at bedside. Pt stable for discharge  I personally performed the services described in this documentation, which was scribed in my presence. The recorded information has been reviewed and is accurate.   Lahoma Crocker Michaela Broski, PA-C 06/17/15 1205  Veryl Speak, MD 06/17/15 1259

## 2015-06-17 NOTE — ED Notes (Signed)
Pt returning here sts was where last night with productive cough and pain in chest with breathing. sts the wait was too long and she left. sts she had everything done yesterday and just needs to see a doctor. sts she was given prescriptions and is getting them filled today.

## 2015-06-17 NOTE — ED Notes (Signed)
Declined W/C at D/C and was escorted to lobby by RN. 

## 2015-06-17 NOTE — Discharge Instructions (Signed)
Take your prescriptions to [facility]. They are able to be fill them for $4. Schedule a follow up appointment with your PCP.    Cough, Adult Coughing is a reflex that clears your throat and your airways. Coughing helps to heal and protect your lungs. It is normal to cough occasionally, but a cough that happens with other symptoms or lasts a long time may be a sign of a condition that needs treatment. A cough may last only 2-3 weeks (acute), or it may last longer than 8 weeks (chronic). CAUSES Coughing is commonly caused by:  Breathing in substances that irritate your lungs.  A viral or bacterial respiratory infection.  Allergies.  Asthma.  Postnasal drip.  Smoking.  Acid backing up from the stomach into the esophagus (gastroesophageal reflux).  Certain medicines.  Chronic lung problems, including COPD (or rarely, lung cancer).  Other medical conditions such as heart failure. HOME CARE INSTRUCTIONS  Pay attention to any changes in your symptoms. Take these actions to help with your discomfort:  Take medicines only as told by your health care provider.  If you were prescribed an antibiotic medicine, take it as told by your health care provider. Do not stop taking the antibiotic even if you start to feel better.  Talk with your health care provider before you take a cough suppressant medicine.  Drink enough fluid to keep your urine clear or pale yellow.  If the air is dry, use a cold steam vaporizer or humidifier in your bedroom or your home to help loosen secretions.  Avoid anything that causes you to cough at work or at home.  If your cough is worse at night, try sleeping in a semi-upright position.  Avoid cigarette smoke. If you smoke, quit smoking. If you need help quitting, ask your health care provider.  Avoid caffeine.  Avoid alcohol.  Rest as needed. SEEK MEDICAL CARE IF:   You have new symptoms.  You cough up pus.  Your cough does not get better after 2-3  weeks, or your cough gets worse.  You cannot control your cough with suppressant medicines and you are losing sleep.  You develop pain that is getting worse or pain that is not controlled with pain medicines.  You have a fever.  You have unexplained weight loss.  You have night sweats. SEEK IMMEDIATE MEDICAL CARE IF:  You cough up blood.  You have difficulty breathing.  Your heartbeat is very fast.   This information is not intended to replace advice given to you by your health care provider. Make sure you discuss any questions you have with your health care provider.   Document Released: 10/15/2010 Document Revised: 01/07/2015 Document Reviewed: 06/25/2014 Elsevier Interactive Patient Education [employer].

## 2015-06-30 ENCOUNTER — Encounter: Payer: Self-pay | Admitting: *Deleted

## 2015-06-30 ENCOUNTER — Telehealth: Payer: Self-pay | Admitting: *Deleted

## 2015-06-30 NOTE — Telephone Encounter (Signed)
Pre-Visit Call completed with patient and chart updated.   Pre-Visit Info documented in Specialty Comments under [employer].    

## 2015-07-01 ENCOUNTER — Encounter: Payer: Medicaid Other | Admitting: Medical

## 2015-07-01 ENCOUNTER — Telehealth: Payer: Self-pay | Admitting: Medical

## 2015-07-01 NOTE — Progress Notes (Signed)
This encounter was created in error - please disregard.

## 2015-07-02 ENCOUNTER — Encounter: Payer: Self-pay | Admitting: Medical

## 2015-07-02 NOTE — Telephone Encounter (Signed)
Pt was no show 07/01/15 1:30pm for new pt appt, pt completed previsit call 06/30/15, pt has not rescheduled, charge or no charge?

## 2015-07-02 NOTE — Telephone Encounter (Signed)
charge 

## 2015-07-02 NOTE — Telephone Encounter (Signed)
Marked to charge, mailing no show letter °

## 2015-08-02 ENCOUNTER — Encounter (HOSPITAL_COMMUNITY): Payer: Self-pay | Admitting: *Deleted

## 2015-08-02 ENCOUNTER — Emergency Department (HOSPITAL_COMMUNITY): Payer: No Typology Code available for payment source

## 2015-08-02 ENCOUNTER — Emergency Department (HOSPITAL_COMMUNITY)
Admission: EM | Admit: 2015-08-02 | Discharge: 2015-08-02 | Disposition: A | Discharge status: Home / Self Care | Payer: No Typology Code available for payment source | Attending: Emergency Medicine | Admitting: Emergency Medicine

## 2015-08-02 DIAGNOSIS — Z862 Personal history of diseases of the blood and blood-forming organs and certain disorders involving the immune mechanism: Secondary | ICD-10-CM | POA: Insufficient documentation

## 2015-08-02 DIAGNOSIS — I10 Essential (primary) hypertension: Secondary | ICD-10-CM | POA: Insufficient documentation

## 2015-08-02 DIAGNOSIS — Y9389 Activity, other specified: Secondary | ICD-10-CM | POA: Insufficient documentation

## 2015-08-02 DIAGNOSIS — T148XXA Other injury of unspecified body region, initial encounter: Secondary | ICD-10-CM

## 2015-08-02 DIAGNOSIS — Z8719 Personal history of other diseases of the digestive system: Secondary | ICD-10-CM | POA: Insufficient documentation

## 2015-08-02 DIAGNOSIS — Y998 Other external cause status: Secondary | ICD-10-CM | POA: Insufficient documentation

## 2015-08-02 DIAGNOSIS — S60221A Contusion of right hand, initial encounter: Secondary | ICD-10-CM | POA: Insufficient documentation

## 2015-08-02 DIAGNOSIS — Z8739 Personal history of other diseases of the musculoskeletal system and connective tissue: Secondary | ICD-10-CM | POA: Insufficient documentation

## 2015-08-02 DIAGNOSIS — Y9289 Other specified places as the place of occurrence of the external cause: Secondary | ICD-10-CM | POA: Insufficient documentation

## 2015-08-02 DIAGNOSIS — Z98811 Dental restoration status: Secondary | ICD-10-CM | POA: Insufficient documentation

## 2015-08-02 DIAGNOSIS — R05 Cough: Secondary | ICD-10-CM | POA: Insufficient documentation

## 2015-08-02 DIAGNOSIS — S6991XA Unspecified injury of right wrist, hand and finger(s), initial encounter: Secondary | ICD-10-CM

## 2015-08-02 DIAGNOSIS — W101XXA Fall (on)(from) sidewalk curb, initial encounter: Secondary | ICD-10-CM | POA: Insufficient documentation

## 2015-08-02 DIAGNOSIS — R059 Cough, unspecified: Secondary | ICD-10-CM

## 2015-08-02 DIAGNOSIS — F17210 Nicotine dependence, cigarettes, uncomplicated: Secondary | ICD-10-CM | POA: Insufficient documentation

## 2015-08-02 MED ORDER — LOSARTAN POTASSIUM 25 MG PO TABS: 25.0000 mg | ORAL_TABLET | Freq: Every day | ORAL | Status: DC

## 2015-08-02 MED ORDER — NAPROXEN 500 MG PO TABS: 500.0000 mg | ORAL_TABLET | Freq: Two times a day (BID) | ORAL | Status: DC

## 2015-08-02 MED ORDER — OXYCODONE-ACETAMINOPHEN 5-325 MG PO TABS
1.0000 | ORAL_TABLET | ORAL | Status: DC | PRN
  Administered 2015-08-02: 1 via ORAL
  Filled 2015-08-02: qty 1

## 2015-08-02 NOTE — ED Provider Notes (Signed)
CSN: [id number]     Arrival date & time 08/02/15  1423 History   First MD Initiated Contact with Patient 08/02/15 1823     Chief Complaint  Patient presents with  . Hand Injury  . Hypertension  . Cough     (Consider location/radiation/quality/duration/timing/severity/associated sxs/prior Treatment) HPI Comments: Stepped off of curb last night and fell on outstretched hand and knee Hand pain since that time Right hand 10/10 pain Percocet here helped some, took hydrocodone last night Hurts inside palm Right handed  No PCP, out of lisinopril, waiting for benefits to get PCP  No LOC/head trauma  Cough for 2 months, productive of phelgm, white thick mucus, coughing so much throat hurts No fevers/chills Smoking cigarettes   Patient is a 50 y.o. female presenting with hand injury, hypertension, and cough.  Hand Injury Associated symptoms: no back pain, no fever and no neck pain   Hypertension Pertinent negatives include no chest pain, no abdominal pain, no headaches and no shortness of breath.  Cough Associated symptoms: no chest pain, no fever, no headaches, no rash, no shortness of breath and no sore throat     Past Medical History  Diagnosis Date  . Anemia   . Medical history non-contributory   . Wears dentures     top  . Headache   . Numbness on right side   . Fibromyalgia   . Hypertension   . Fibromyalgia   . Abdominal hernia    Past Surgical History  Procedure Laterality Date  . Tubal ligation    . Cesarean section    . Incise and drain abcess  2005    rt middle finger   . Mass excision Right 08/06/2012    Procedure: EXCISION OF LARGE MASS right FLANK WITH LIPO ASSISTANCE;  Surgeon: Cristine Polio, MD;  Location: [facility];  Service: Plastics;  Laterality: Right;  . Abdominoplasty N/A 08/12/2013    Procedure: 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;  Surgeon: Cristine Polio, MD;  Location: [place];   Service: Plastics;  Laterality: N/A;  . Hernia repair     Family History  Problem Relation Age of Onset  . Hypertension Mother   . Heart disease Father   . Heart disease Brother    Social History  Substance Use Topics  . Smoking status: Current Every Day Smoker -- 0.20 packs/day    Types: Cigarettes  . Smokeless tobacco: Never Used  . Alcohol Use: Yes     Comment: occassional   OB History    Gravida Para Term Preterm AB TAB SAB Ectopic Multiple Living   2 2 1 1      2      Review of Systems  Constitutional: Negative for fever.  HENT: Negative for sore throat.   Eyes: Negative for visual disturbance.  Respiratory: Positive for cough. Negative for shortness of breath.   Cardiovascular: Negative for chest pain.  Gastrointestinal: Negative for nausea, vomiting and abdominal pain.  Genitourinary: Negative for difficulty urinating.  Musculoskeletal: Negative for back pain and neck pain.  Skin: Negative for rash.  Neurological: Negative for syncope and headaches.      Allergies  Review of patient's allergies indicates no known allergies.  Home Medications   Prior to Admission medications   Medication Sig Start Date End Date Taking? Authorizing Provider  Aspirin-Salicylamide-Caffeine (BC HEADACHE POWDER PO) Take by mouth as needed.    Historical Provider, MD  benzonatate (TESSALON PERLES) 100 MG capsule Take 1  capsule (100 mg total) by mouth 3 (three) times daily as needed for cough. Patient not taking: Reported on 06/30/2015 06/13/15 06/12/16  Cecille Rubin A Clemmons, CNM  losartan (COZAAR) 25 MG tablet Take 1 tablet (25 mg total) by mouth daily. 08/02/15 09/01/15  Gareth Morgan, MD  naproxen (NAPROSYN) 500 MG tablet Take 1 tablet (500 mg total) by mouth 2 (two) times daily. 08/02/15   Gareth Morgan, MD   BP 133/78 mmHg  Pulse 79  Temp(Src) 97.9 F (36.6 C) (Oral)  Resp 18  SpO2 100%  LMP 05/08/2013 Physical Exam  Constitutional: She is oriented to person, place, and time. She  appears well-developed and well-nourished. No distress.  HENT:  Head: Normocephalic and atraumatic.  Eyes: Conjunctivae and EOM are normal.  Neck: Normal range of motion.  Cardiovascular: Normal rate, regular rhythm, normal heart sounds and intact distal pulses.  Exam reveals no gallop and no friction rub.   No murmur heard. Pulmonary/Chest: Effort normal and breath sounds normal. No respiratory distress. She has no wheezes. She has no rales.  Abdominal: Soft. She exhibits no distension. There is no tenderness. There is no guarding.  Musculoskeletal: She exhibits no edema or tenderness.       Right hand: She exhibits decreased range of motion (mild decrease in extension of all finers secondary to swelling of dorsum of hand) and bony tenderness (5th MCP.). She exhibits no laceration (small abrasion palmar side, no surorunding erythema). Normal sensation noted. Decreased sensation is not present in the ulnar distribution, is not present in the medial distribution and is not present in the radial distribution. Normal strength noted. She exhibits no finger abduction, no thumb/finger opposition and no wrist extension trouble.  No snuff box tenderness  Neurological: She is alert and oriented to person, place, and time.  Skin: Skin is warm and dry. No rash noted. She is not diaphoretic. No erythema.  Nursing note and vitals reviewed.   ED Course  Procedures (including critical care time) Labs Review Labs Reviewed - No data to display  Imaging Review Dg Hand Complete Right  08/02/2015  CLINICAL DATA:  Acute right hand pain after fall yesterday. Initial encounter. EXAM: RIGHT HAND - COMPLETE 3+ VIEW COMPARISON:  July 30, 2009. FINDINGS: There is no evidence of fracture or dislocation. There is no evidence of arthropathy or other focal bone abnormality. Soft tissues are unremarkable. IMPRESSION: Normal right hand. Electronically Signed   By: Marijo Conception, M.D.   On: 08/02/2015 15:07   I have  personally reviewed and evaluated these images and lab results as part of my medical decision-making.   EKG Interpretation None      MDM   Final diagnoses:  Hand injury, right, initial encounter  Contusion  Cough  Essential hypertension   50 year old female with a history of ever mild hypertension presents with concern for right hand injury, desiring refill for blood pressure medications, and chronic cough. X-ray of the right hand shows no sign of acute fracture. Patient does not have any snuff box tenderness, and have low suspicion for scaphoid fracture. She has a small abrasion over her palm without surrounding erythema or signs of infection. Patient is nervously intact. She does have some swelling to the right-hand likely contusion from the fall. She was given a sling to help keep it elevated, and ace bandage.  Recommends ice, Tylenol, and key prescription for naproxen. Patient requesting other narcotic prescription and that home tramadol and norco have not helped, however do not feel injury  is appropriate for narcotic rx.  Patient reports cough x months. Patient without tachypnea, no hypoxia, normal oxygen saturation and good breath sounds bilaterally and have low suspicion for pneumonia. Discussed smoking cessation and need for PCP follow up regarding this. Patient requesting refill of her lisinopril.  Given cough, which may also be secondary to this, will change rx to losartan and recommend close PCP follow up. Patient discharged in stable condition with understanding of reasons to return.   Gareth Morgan, MD 08/03/15 1245

## 2015-08-02 NOTE — ED Notes (Signed)
Pt complains of right hand pain since falling and bending her middle and ring fingers backwards yesterday. Pt has limited ROM in middle and ring fingers. Pt also states she took her last dose of lisinopril yesterday and would like a refill. Pt also complains of cough that she was treated for 2 months ago, which she states has not improved.

## 2015-08-02 NOTE — ED Notes (Signed)
Ortho tech called for shoulder sling

## 2015-08-02 NOTE — ED Notes (Signed)
Pt is refusing to get dressed out in to a gown stating "All I need is an x-ray of my hand I ain't getting in no gown".

## 2015-08-03 ENCOUNTER — Encounter (HOSPITAL_BASED_OUTPATIENT_CLINIC_OR_DEPARTMENT_OTHER): Payer: Self-pay | Admitting: Emergency Medicine

## 2015-12-26 ENCOUNTER — Encounter (HOSPITAL_COMMUNITY): Payer: Self-pay | Admitting: Emergency Medicine

## 2015-12-26 DIAGNOSIS — Z5321 Procedure and treatment not carried out due to patient leaving prior to being seen by health care provider: Secondary | ICD-10-CM | POA: Diagnosis not present

## 2015-12-26 DIAGNOSIS — J029 Acute pharyngitis, unspecified: Secondary | ICD-10-CM | POA: Diagnosis present

## 2015-12-26 NOTE — ED Triage Notes (Addendum)
Pt from home with complaints of throat pain that has been ongoing for about 1 month. Pt states pain has become more severe today. Pt states she has an occasional cough, but it is rare and it is nonproductive

## 2015-12-27 ENCOUNTER — Emergency Department (HOSPITAL_COMMUNITY)
Admission: EM | Admit: 2015-12-27 | Discharge: 2015-12-27 | Disposition: A | Discharge status: Home / Self Care | Payer: Medicaid Other | Attending: Emergency Medicine | Admitting: Emergency Medicine

## 2015-12-27 DIAGNOSIS — J029 Acute pharyngitis, unspecified: Secondary | ICD-10-CM

## 2015-12-27 NOTE — ED Provider Notes (Signed)
Emergency Department Provider Note   I have reviewed the triage vital signs and the nursing notes.   HISTORY  Chief Complaint Sore Throat   HPI Robin Pace is a 50 y.o. female who left the ED without being seen by a provider. I was unable to exam or discuss the patient's symptoms.    Past Medical History:  Diagnosis Date  . Abdominal hernia   . Anemia   . Fibromyalgia   . Fibromyalgia   . Headache   . Hypertension   . Medical history non-contributory   . Numbness on right side   . Wears dentures    top    Patient Active Problem List   Diagnosis Date Noted  . Neck pain on right side 09/10/2013  . Low back pain 09/10/2013  . Headache   . Numbness on right side     Past Surgical History:  Procedure Laterality Date  . ABDOMINOPLASTY N/A 08/12/2013   Procedure: 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;  Surgeon: Cristine Polio, MD;  Location: [facility];  Service: Plastics;  Laterality: N/A;  . CESAREAN SECTION    . HERNIA REPAIR    . INCISE AND DRAIN ABCESS  2005   rt middle finger   . MASS EXCISION Right 08/06/2012   Procedure: EXCISION OF LARGE MASS right FLANK WITH LIPO ASSISTANCE;  Surgeon: Cristine Polio, MD;  Location: [facility];  Service: Plastics;  Laterality: Right;  . TUBAL LIGATION      Current Outpatient Rx  . Order #: [id number] Class: Historical Med  . Order #: [id number] Class: Normal  . Order #: [id number] Class: Print  . Order #: [id number] Class: Print    Allergies Review of patient's allergies indicates no known allergies.  Family History  Problem Relation Age of Onset  . Hypertension Mother   . Heart disease Father   . Heart disease Brother     Social History Social History  Substance Use Topics  . Smoking status: Current Every Day Smoker    Packs/day: 0.20    Types: Cigarettes  . Smokeless tobacco: Never Used  . Alcohol use Yes     Comment: occassional    Review of  Systems  None  ____________________________________________   PHYSICAL EXAM:  VITAL SIGNS: ED Triage Vitals  Enc Vitals Group     BP 12/26/15 2326 154/91     Pulse Rate 12/26/15 2326 87     Resp 12/26/15 2326 14     Temp 12/26/15 2326 97.8 F (36.6 C)     Temp Source 12/26/15 2326 Oral     SpO2 12/26/15 2326 93 %     Weight 12/26/15 2326 165 lb (74.8 kg)     Height 12/26/15 2326 5\' 8"  (1.727 m)   Unable to examine the patient.   ____________________________________________   PROCEDURES  Procedure(s) performed:   Procedures  None ____________________________________________   INITIAL IMPRESSION / ASSESSMENT AND PLAN / ED COURSE  Pertinent labs & imaging results that were available during my care of the patient were reviewed by me and considered in my medical decision making (see chart for details).  Patient left the ED without being seen.    ____________________________________________  FINAL CLINICAL IMPRESSION(S) / ED DIAGNOSES  Final diagnoses:  Sore throat     MEDICATIONS GIVEN DURING THIS VISIT:  None  NEW OUTPATIENT MEDICATIONS STARTED DURING THIS VISIT:  None   Note:  This document was prepared using Dragon voice recognition software and may include unintentional  dictation errors.  Nanda Quinton, MD Emergency Medicine   Margette Fast, MD 12/27/15 [phone]

## 2015-12-27 NOTE — ED Notes (Signed)
Patient asked to be taken out of system,stated she would come back tomorrow.

## 2016-01-20 ENCOUNTER — Encounter (HOSPITAL_COMMUNITY): Payer: Self-pay | Admitting: *Deleted

## 2016-01-20 ENCOUNTER — Emergency Department (HOSPITAL_COMMUNITY)
Admission: EM | Admit: 2016-01-20 | Discharge: 2016-01-20 | Disposition: A | Discharge status: Home / Self Care | Payer: Medicaid Other | Attending: Emergency Medicine | Admitting: Emergency Medicine

## 2016-01-20 DIAGNOSIS — F17210 Nicotine dependence, cigarettes, uncomplicated: Secondary | ICD-10-CM | POA: Insufficient documentation

## 2016-01-20 DIAGNOSIS — G43809 Other migraine, not intractable, without status migrainosus: Secondary | ICD-10-CM | POA: Insufficient documentation

## 2016-01-20 DIAGNOSIS — Z7982 Long term (current) use of aspirin: Secondary | ICD-10-CM | POA: Insufficient documentation

## 2016-01-20 DIAGNOSIS — Z79899 Other long term (current) drug therapy: Secondary | ICD-10-CM | POA: Insufficient documentation

## 2016-01-20 DIAGNOSIS — R531 Weakness: Secondary | ICD-10-CM

## 2016-01-20 DIAGNOSIS — I10 Essential (primary) hypertension: Secondary | ICD-10-CM | POA: Insufficient documentation

## 2016-01-20 LAB — URINALYSIS, ROUTINE W REFLEX MICROSCOPIC
BILIRUBIN URINE: NEGATIVE
Glucose, UA: NEGATIVE mg/dL
Hgb urine dipstick: NEGATIVE
Ketones, ur: NEGATIVE mg/dL
LEUKOCYTES UA: NEGATIVE
NITRITE: NEGATIVE
Protein, ur: NEGATIVE mg/dL
SPECIFIC GRAVITY, URINE: 1.014 (ref 1.005–1.030)
pH: 5.5 (ref 5.0–8.0)

## 2016-01-20 LAB — BASIC METABOLIC PANEL
ANION GAP: 8 (ref 5–15)
BUN: 19 mg/dL (ref 6–20)
CALCIUM: 9.2 mg/dL (ref 8.9–10.3)
CO2: 26 mmol/L (ref 22–32)
Chloride: 108 mmol/L (ref 101–111)
Creatinine, Ser: 1.11 mg/dL — ABNORMAL HIGH (ref 0.44–1.00)
GFR, EST NON AFRICAN AMERICAN: 57 mL/min — AB (ref >60)
Glucose, Bld: 97 mg/dL (ref 65–99)
POTASSIUM: 3.8 mmol/L (ref 3.5–5.1)
Sodium: 142 mmol/L (ref 135–145)

## 2016-01-20 LAB — CBC
HCT: 40.1 % (ref 36.0–46.0)
HEMOGLOBIN: 13.4 g/dL (ref 12.0–15.0)
MCH: 27.5 pg (ref 26.0–34.0)
MCHC: 33.4 g/dL (ref 30.0–36.0)
MCV: 82.3 fL (ref 78.0–100.0)
Platelets: 232 10*3/uL (ref 150–400)
RBC: 4.87 MIL/uL (ref 3.87–5.11)
RDW: 13.6 % (ref 11.5–15.5)
WBC: 5.8 10*3/uL (ref 4.0–10.5)

## 2016-01-20 LAB — CBG MONITORING, ED: GLUCOSE-CAPILLARY: 113 mg/dL — AB (ref 65–99)

## 2016-01-20 MED ORDER — SODIUM CHLORIDE 0.9 % IV SOLN
INTRAVENOUS | Status: DC
  Administered 2016-01-20: 17:00:00 via INTRAVENOUS

## 2016-01-20 MED ORDER — KETOROLAC TROMETHAMINE 30 MG/ML IJ SOLN
30.0000 mg | Freq: Once | INTRAMUSCULAR | Status: AC
  Administered 2016-01-20: 30 mg via INTRAVENOUS
  Filled 2016-01-20: qty 1

## 2016-01-20 MED ORDER — METOCLOPRAMIDE HCL 5 MG/ML IJ SOLN
10.0000 mg | Freq: Once | INTRAMUSCULAR | Status: AC
  Administered 2016-01-20: 10 mg via INTRAVENOUS
  Filled 2016-01-20: qty 2

## 2016-01-20 MED ORDER — DIPHENHYDRAMINE HCL 50 MG/ML IJ SOLN
25.0000 mg | Freq: Once | INTRAMUSCULAR | Status: AC
  Administered 2016-01-20: 25 mg via INTRAVENOUS
  Filled 2016-01-20: qty 1

## 2016-01-20 NOTE — ED Notes (Signed)
Patient was alert, oriented and stable upon discharge. RN went over AVS and patient had no further questions.  

## 2016-01-20 NOTE — ED Provider Notes (Signed)
[facility] DEPT Provider Note   CSN: [id number] Arrival date & time: 01/20/16  1415     History   Chief Complaint Chief Complaint  Patient presents with  . Weakness    HPI Robin Pace is a 50 y.o. female.  HPI Patient was that she took her medications this afternoon rather than the morning. After she took her regular medications. She felt somewhat dizzy and lightheaded. She also reports that she developed a headache that was both frontal and posterior. Some stiffness to the back of her neck. With this she had photophobia. No blurred vision nausea vomiting. She reports this is headache feels like a migraine. She has not taken her migraine medications. She reports usually if she gets a "migraine cocktail" it resolves his symptoms. Focal weakness numbness or tingling. I review systems patient has had some mild cough over the past week. No fever no chest pain no shortness of breath. Review of systems otherwise negative. Past Medical History:  Diagnosis Date  . Abdominal hernia   . Anemia   . Fibromyalgia   . Fibromyalgia   . Headache   . Hypertension   . Medical history non-contributory   . Numbness on right side   . Wears dentures    top    Patient Active Problem List   Diagnosis Date Noted  . Neck pain on right side 09/10/2013  . Low back pain 09/10/2013  . Headache   . Numbness on right side     Past Surgical History:  Procedure Laterality Date  . ABDOMINOPLASTY N/A 08/12/2013   Procedure: 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;  Surgeon: Cristine Polio, MD;  Location: [facility];  Service: Plastics;  Laterality: N/A;  . CESAREAN SECTION    . HERNIA REPAIR    . INCISE AND DRAIN ABCESS  2005   rt middle finger   . MASS EXCISION Right 08/06/2012   Procedure: EXCISION OF LARGE MASS right FLANK WITH LIPO ASSISTANCE;  Surgeon: Cristine Polio, MD;  Location: [facility];  Service: Plastics;  Laterality: Right;  .  TUBAL LIGATION      OB History    Gravida Para Term Preterm AB Living   2 2 1 1   2    SAB TAB Ectopic Multiple Live Births                   Home Medications    Prior to Admission medications   Medication Sig Start Date End Date Taking? Authorizing Provider  aspirin 81 MG chewable tablet Chew 81 mg by mouth daily. 11/26/15  Yes Historical Provider, MD  carvedilol (COREG) 3.125 MG tablet Take 3.125 mg by mouth 2 (two) times daily. 12/16/15  Yes Historical Provider, MD  furosemide (LASIX) 40 MG tablet Take 40 mg by mouth daily. 12/16/15  Yes Historical Provider, MD  losartan (COZAAR) 25 MG tablet Take 1 tablet (25 mg total) by mouth daily. 08/02/15 01/20/16 Yes Gareth Morgan, MD  spironolactone (ALDACTONE) 25 MG tablet Take 25 mg by mouth daily. 12/16/15  Yes Historical Provider, MD    Family History Family History  Problem Relation Age of Onset  . Hypertension Mother   . Heart disease Father   . Heart disease Brother     Social History Social History  Substance Use Topics  . Smoking status: Current Every Day Smoker    Packs/day: 0.20    Types: Cigarettes  . Smokeless tobacco: Never Used  . Alcohol use Yes  Comment: occassional     Allergies   Review of patient's allergies indicates no known allergies.   Review of Systems Review of Systems  10 Systems reviewed and are negative for acute change except as noted in the HPI. Physical Exam Updated Vital Signs BP 120/92 (BP Location: Right Arm)   Pulse 82   Temp 98.1 F (36.7 C) (Oral)   Resp 18   Ht 5\' 8"  (1.727 m)   Wt 180 lb (81.6 kg)   LMP 05/08/2013   SpO2 99%   BMI 27.37 kg/m   Physical Exam  Constitutional: She is oriented to person, place, and time. She appears well-developed and well-nourished. No distress.  HENT:  Head: Normocephalic and atraumatic.  Right Ear: External ear normal.  Left Ear: External ear normal.  Nose: Nose normal.  Mouth/Throat: Oropharynx is clear and moist.  Bilateral TMs  normal.  Eyes: Conjunctivae and EOM are normal. Pupils are equal, round, and reactive to light.  Neck: Neck supple. No tracheal deviation present. No thyromegaly present.  Cardiovascular: Normal rate, regular rhythm and normal heart sounds.   No murmur heard. Pulmonary/Chest: Effort normal and breath sounds normal. No stridor. No respiratory distress.  Abdominal: Soft. There is no tenderness.  Musculoskeletal: She exhibits no edema, tenderness or deformity.  Lymphadenopathy:    She has no cervical adenopathy.  Neurological: She is alert and oriented to person, place, and time. No cranial nerve deficit. She exhibits normal muscle tone. Coordination normal.  Skin: Skin is warm and dry.  Psychiatric: She has a normal mood and affect.  Nursing note and vitals reviewed.    ED Treatments / Results  Labs (all labs ordered are listed, but only abnormal results are displayed) Labs Reviewed  BASIC METABOLIC PANEL - Abnormal; Notable for the following:       Result Value   Creatinine, Ser 1.11 (*)    GFR calc non Af Amer 57 (*)    All other components within normal limits  URINALYSIS, ROUTINE W REFLEX MICROSCOPIC (NOT AT [facility]) - Abnormal; Notable for the following:    APPearance CLOUDY (*)    All other components within normal limits  CBG MONITORING, ED - Abnormal; Notable for the following:    Glucose-Capillary 113 (*)    All other components within normal limits  CBC    EKG  EKG Interpretation  Date/Time:  Wednesday January 20 2016 14:21:22 EDT Ventricular Rate:  84 PR Interval:    QRS Duration: 104 QT Interval:  394 QTC Calculation: 466 R Axis:   62 Text Interpretation:  Sinus rhythm Borderline prolonged PR interval LVH with secondary repolarization abnormality ST-t wave abnormality Abnormal ekg no sig change from previous Reconfirmed by Johnney Killian, MD, Jeannie Done [phone]) on 01/20/2016 6:05:05 PM       Radiology No results found.  Procedures Procedures (including critical care  time)  Medications Ordered in ED Medications  0.9 %  sodium chloride infusion ( Intravenous New Bag/Given 01/20/16 1648)  diphenhydrAMINE (BENADRYL) injection 25 mg (25 mg Intravenous Given 01/20/16 1648)  ketorolac (TORADOL) 30 MG/ML injection 30 mg (30 mg Intravenous Given 01/20/16 1648)  metoCLOPramide (REGLAN) injection 10 mg (10 mg Intravenous Given 01/20/16 1648)     Initial Impression / Assessment and Plan / ED Course  I have reviewed the triage vital signs and the nursing notes.  Pertinent labs & imaging results that were available during my care of the patient were reviewed by me and considered in my medical decision making (see chart for  details).  Clinical Course     Final Clinical Impressions(s) / ED Diagnoses   Final diagnoses:  Other migraine without status migrainosus, not intractable  Weakness  Patient reports that after she took her medication she felt weak and off balance. This resolved but then she persisted with symptoms of frontal and posterior headache that she reports are consistent with her migraine. Patient were she did not take her regular migraine medications. Patient has well appearance. She has normal physical examination is in no distress. Diagnostic workup is within normal limits. At this time I feel she is safe for discharge and continued outpatient management.  New Prescriptions New Prescriptions   No medications on file     Charlesetta Shanks, MD 01/20/16 1827

## 2016-01-20 NOTE — ED Triage Notes (Signed)
Pt reports feeling fatigued and weak and "out of it" 15 minutes after taking her meds this am.  Pt also reports dizziness.  Pt denies any pain at this time.  Just feeling "weird."  Pt is A&Ox4.   Pt states laying back feels better, states she has blurred vision and "pounding" h/a.

## 2016-01-20 NOTE — ED Notes (Signed)
Pt given ginger ale and turkey sandwich 

## 2016-02-29 ENCOUNTER — Ambulatory Visit: Payer: Self-pay | Admitting: Family Medicine

## 2016-03-07 ENCOUNTER — Ambulatory Visit: Payer: Self-pay | Admitting: Family Medicine

## 2016-03-31 ENCOUNTER — Ambulatory Visit (INDEPENDENT_AMBULATORY_CARE_PROVIDER_SITE_OTHER): Payer: No Typology Code available for payment source | Admitting: Family Medicine

## 2016-03-31 ENCOUNTER — Encounter: Payer: Self-pay | Admitting: Family Medicine

## 2016-03-31 VITALS — BP 127/83 | HR 87 | Temp 97.7°F | Resp 16 | Ht 68.0 in | Wt 189.0 lb

## 2016-03-31 DIAGNOSIS — Z0000 Encounter for general adult medical examination without abnormal findings: Secondary | ICD-10-CM

## 2016-03-31 LAB — COMPLETE METABOLIC PANEL WITH GFR
ALBUMIN: 4.2 g/dL (ref 3.6–5.1)
ALK PHOS: 60 U/L (ref 33–115)
ALT: 19 U/L (ref 6–29)
AST: 15 U/L (ref 10–35)
BILIRUBIN TOTAL: 0.6 mg/dL (ref 0.2–1.2)
BUN: 16 mg/dL (ref 7–25)
CO2: 28 mmol/L (ref 20–31)
Calcium: 9.5 mg/dL (ref 8.6–10.2)
Chloride: 104 mmol/L (ref 98–110)
Creat: 1.07 mg/dL (ref 0.50–1.10)
GFR, EST NON AFRICAN AMERICAN: 61 mL/min (ref >=60)
GFR, Est African American: 70 mL/min (ref >=60)
Glucose, Bld: 95 mg/dL (ref 65–99)
Potassium: 4.1 mmol/L (ref 3.5–5.3)
Sodium: 140 mmol/L (ref 135–146)
TOTAL PROTEIN: 7.1 g/dL (ref 6.1–8.1)

## 2016-03-31 LAB — LIPID PANEL
Cholesterol: 209 mg/dL — ABNORMAL HIGH (ref <200)
HDL: 39 mg/dL — AB (ref >50)
LDL CALC: 117 mg/dL — AB (ref <100)
TRIGLYCERIDES: 266 mg/dL — AB (ref <150)
Total CHOL/HDL Ratio: 5.4 Ratio — ABNORMAL HIGH (ref <5.0)
VLDL: 53 mg/dL — ABNORMAL HIGH (ref <30)

## 2016-03-31 NOTE — Progress Notes (Signed)
Robin Pace, is a 50 y.o. female  [id number]  [id number]  DOB - 02-Apr-1966  CC:  Chief Complaint  Patient presents with  . Facial Swelling    in neck area x 5 months   . Hypertension  . Establish Care       HPI: Robin Pace is a 50 y.o. female here to establish care. She is followed by cardiology for hypertension and cardiomyopathy. She is here for primary care services. She has a history of anemia, abd hernia repair, fibromyalgia, headaches. She is currently of carvedilol 3.125, Cozaar 25, aldactone 25 and lasix 40. Her major concern today is of submandibular nodules. These have been present for about 5 months. She does have some dental caries and gum inflammation. She also reports lipomas on her anterior and lateral chest wall.    No Known Allergies Past Medical History:  Diagnosis Date  . Abdominal hernia   . Anemia   . Fibromyalgia   . Fibromyalgia   . Headache   . Hypertension   . Medical history non-contributory   . Numbness on right side   . Wears dentures    top   Current Outpatient Prescriptions on File Prior to Visit  Medication Sig Dispense Refill  . aspirin 81 MG chewable tablet Chew 81 mg by mouth daily.    . carvedilol (COREG) 3.125 MG tablet Take 3.125 mg by mouth 2 (two) times daily.    . furosemide (LASIX) 40 MG tablet Take 40 mg by mouth daily.    Marland Kitchen spironolactone (ALDACTONE) 25 MG tablet Take 25 mg by mouth daily.    Marland Kitchen losartan (COZAAR) 25 MG tablet Take 1 tablet (25 mg total) by mouth daily. 30 tablet 0  . [DISCONTINUED] lisinopril (PRINIVIL,ZESTRIL) 20 MG tablet Take 1 tablet (20 mg total) by mouth daily. 30 tablet 0   No current facility-administered medications on file prior to visit.    Family History  Problem Relation Age of Onset  . Hypertension Mother   . Heart disease Father   . Heart disease Brother    Social History   Social History  . Marital status: Single    Spouse name: N/A  . Number of children: 2  . Years of  education: college   Occupational History  .      not working   Social History Main Topics  . Smoking status: Current Every Day Smoker    Packs/day: 0.25    Types: Cigarettes  . Smokeless tobacco: Never Used  . Alcohol use No     Comment: denies use 03/31/2016   . Drug use:     Types: Marijuana     Comment: occ  . Sexual activity: Not Currently    Birth control/ protection: None, Surgical   Other Topics Concern  . Not on file   Social History Narrative   Patient lives at home with her two children . Patient is single.   Patient is not working no trying for disability.   Right handed.   Caffeine. Sometimes coffee.    Review of Systems: Constitutional: + for fatigue, Skin: Negative HENT: + for lumps under jaw Eyes: Occ blurred vision Neck: Negative Respiratory: Negative Cardiovascular: + for occ chest pain Gastrointestinal: + for abdominal pain probably related to hernia repair Genitourinary: Negative  Musculoskeletal: + wide spread pain   Neurological: + for headaches Hematological: Negative  Psychiatric/Behavioral: Negative    Objective:   Vitals:   03/31/16 1448  BP: 127/83  Pulse: 87  Resp: 16  Temp: 97.7 F (36.5 C)    Physical Exam: Constitutional: Patient appears well-developed and well-nourished. No distress. HENT: Normocephalic, atraumatic, External right and left ear normal. Oropharynx is clear and moist.  Eyes: Conjunctivae and EOM are normal. PERRLA, no scleral icterus. Neck: Normal ROM. Neck supple. No lymphadenopathy, No thyromegaly. CVS: RRR, S1/S2 +, no murmurs, no gallops, no rubs Pulmonary: Effort and breath sounds normal, no stridor, rhonchi, wheezes, rales.  Abdominal: Soft. Normoactive BS,, no distension, tenderness, rebound or guarding.  Musculoskeletal: Normal range of motion. No edema and no tenderness.  Neuro: Alert.Normal muscle tone coordination. Non-focal Skin: Skin is warm and dry. No rash noted. Not diaphoretic. No  erythema. No pallor. Psychiatric: Normal mood and affect. Behavior, judgment, thought content normal.  Lab Results  Component Value Date   WBC 5.8 01/20/2016   HGB 13.4 01/20/2016   HCT 40.1 01/20/2016   MCV 82.3 01/20/2016   PLT 232 01/20/2016   Lab Results  Component Value Date   CREATININE 1.11 (H) 01/20/2016   BUN 19 01/20/2016   NA 142 01/20/2016   K 3.8 01/20/2016   CL 108 01/20/2016   CO2 26 01/20/2016    No results found for: HGBA1C Lipid Panel     Component Value Date/Time   CHOL 226 (H) 03/31/2009 2153   TRIG 229 (H) 03/31/2009 2153   HDL 33 (L) 03/31/2009 2153   CHOLHDL 6.8 Ratio 03/31/2009 2153   VLDL 46 (H) 03/31/2009 2153   LDLCALC 147 (H) 03/31/2009 2153        Assessment and plan:   1. Healthcare maintenance  - TSH - Lipid panel - COMPLETE METABOLIC PANEL WITH GFR - POC Hemoccult Bld/Stl (3-Cd Home Screen); Future - MM DIGITAL SCREENING BILATERAL; Future - Ambulatory referral to Dentistry   Return in about 6 months (around 09/28/2016).  The patient was given clear instructions to go to ER or return to medical center if symptoms don't improve, worsen or new problems develop. The patient verbalized understanding.    Micheline Chapman FNP  03/31/2016, 3:31 PM

## 2016-04-01 LAB — TSH: TSH: 1.37 mIU/L

## 2016-04-06 ENCOUNTER — Other Ambulatory Visit: Payer: Self-pay | Admitting: Family Medicine

## 2016-04-06 DIAGNOSIS — Z1231 Encounter for screening mammogram for malignant neoplasm of breast: Secondary | ICD-10-CM

## 2016-04-17 IMAGING — CR DG CHEST 2V
2 series · 2 of 2 positions shown · non-contrast
Comparison: 04/11/2015

CLINICAL DATA: Worsening midchest pain.

EXAM:
CHEST  2 VIEW

[w chest pa]
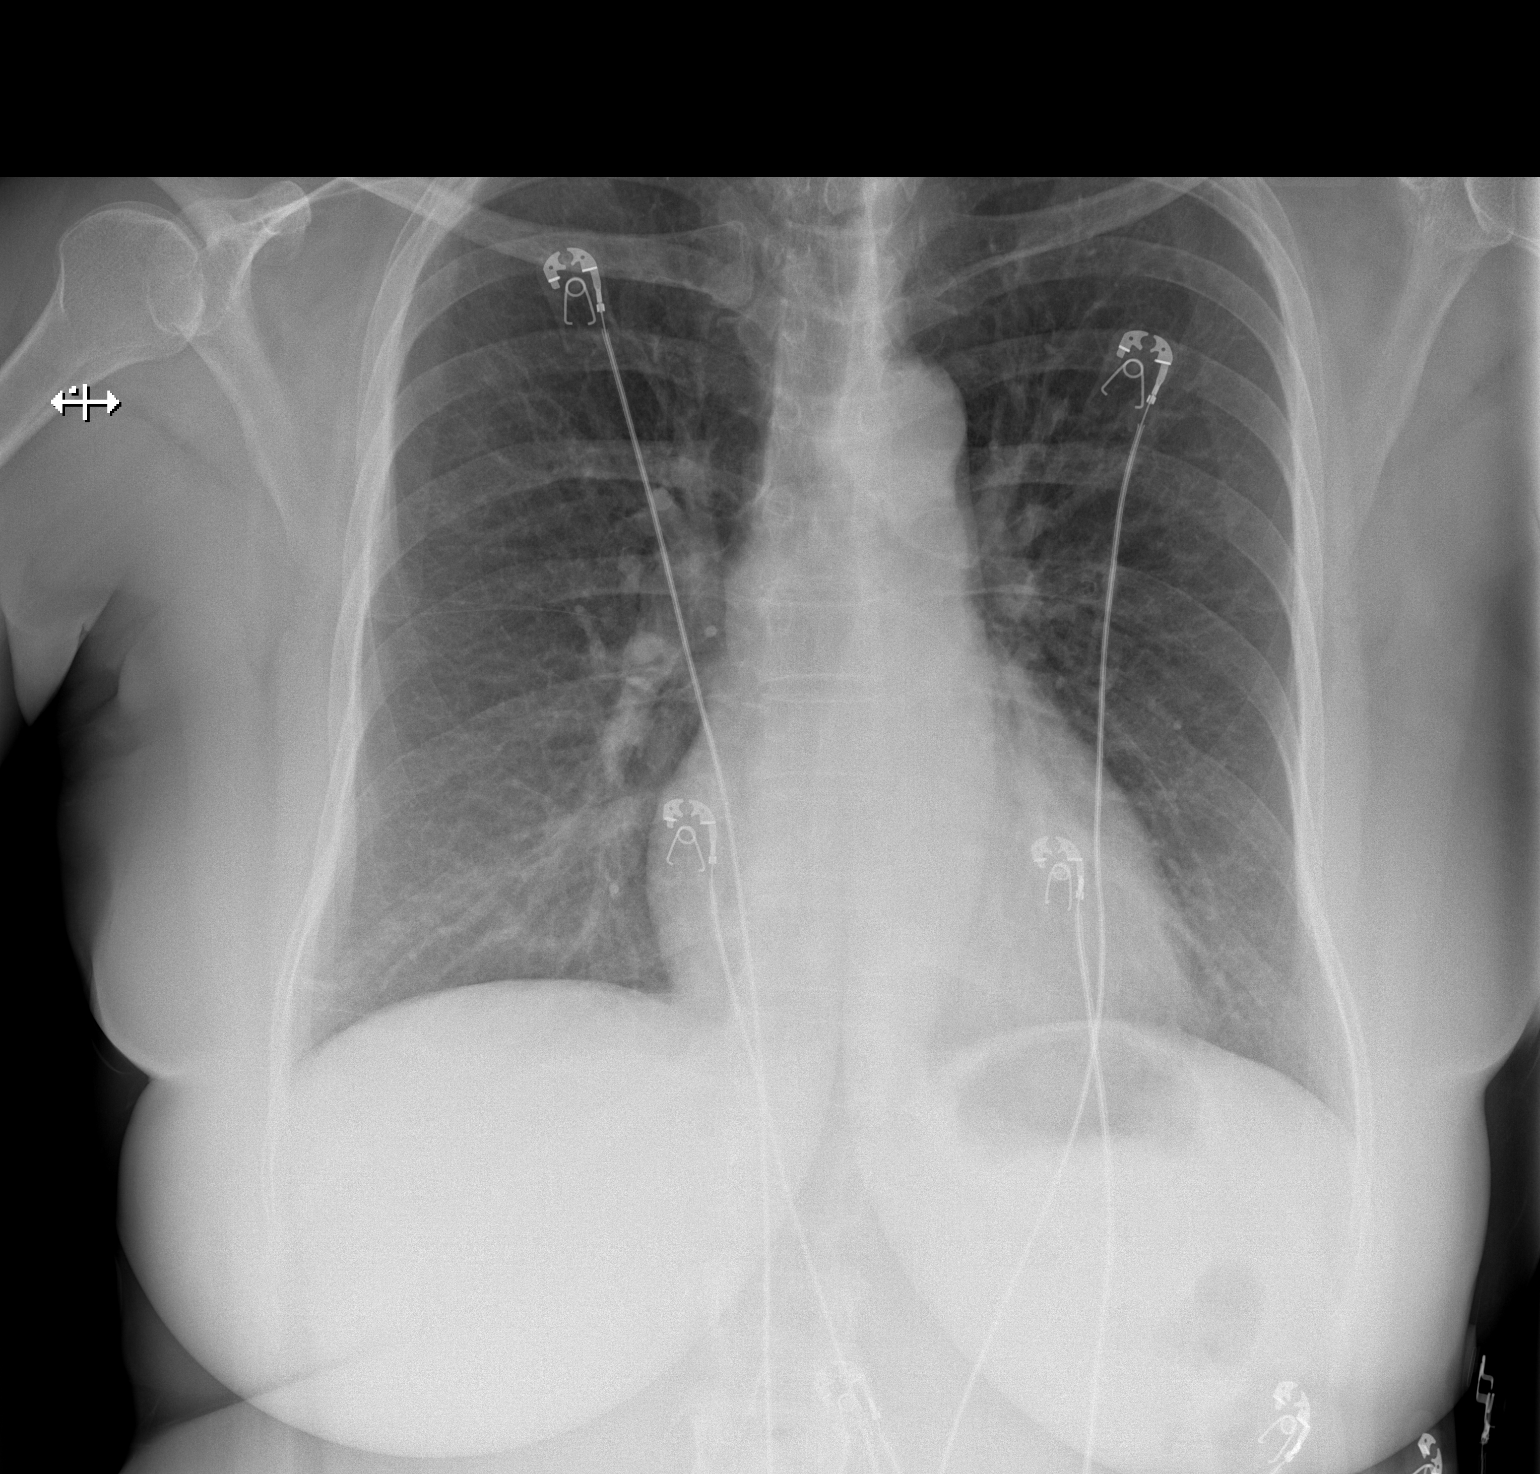

[w chest lat]
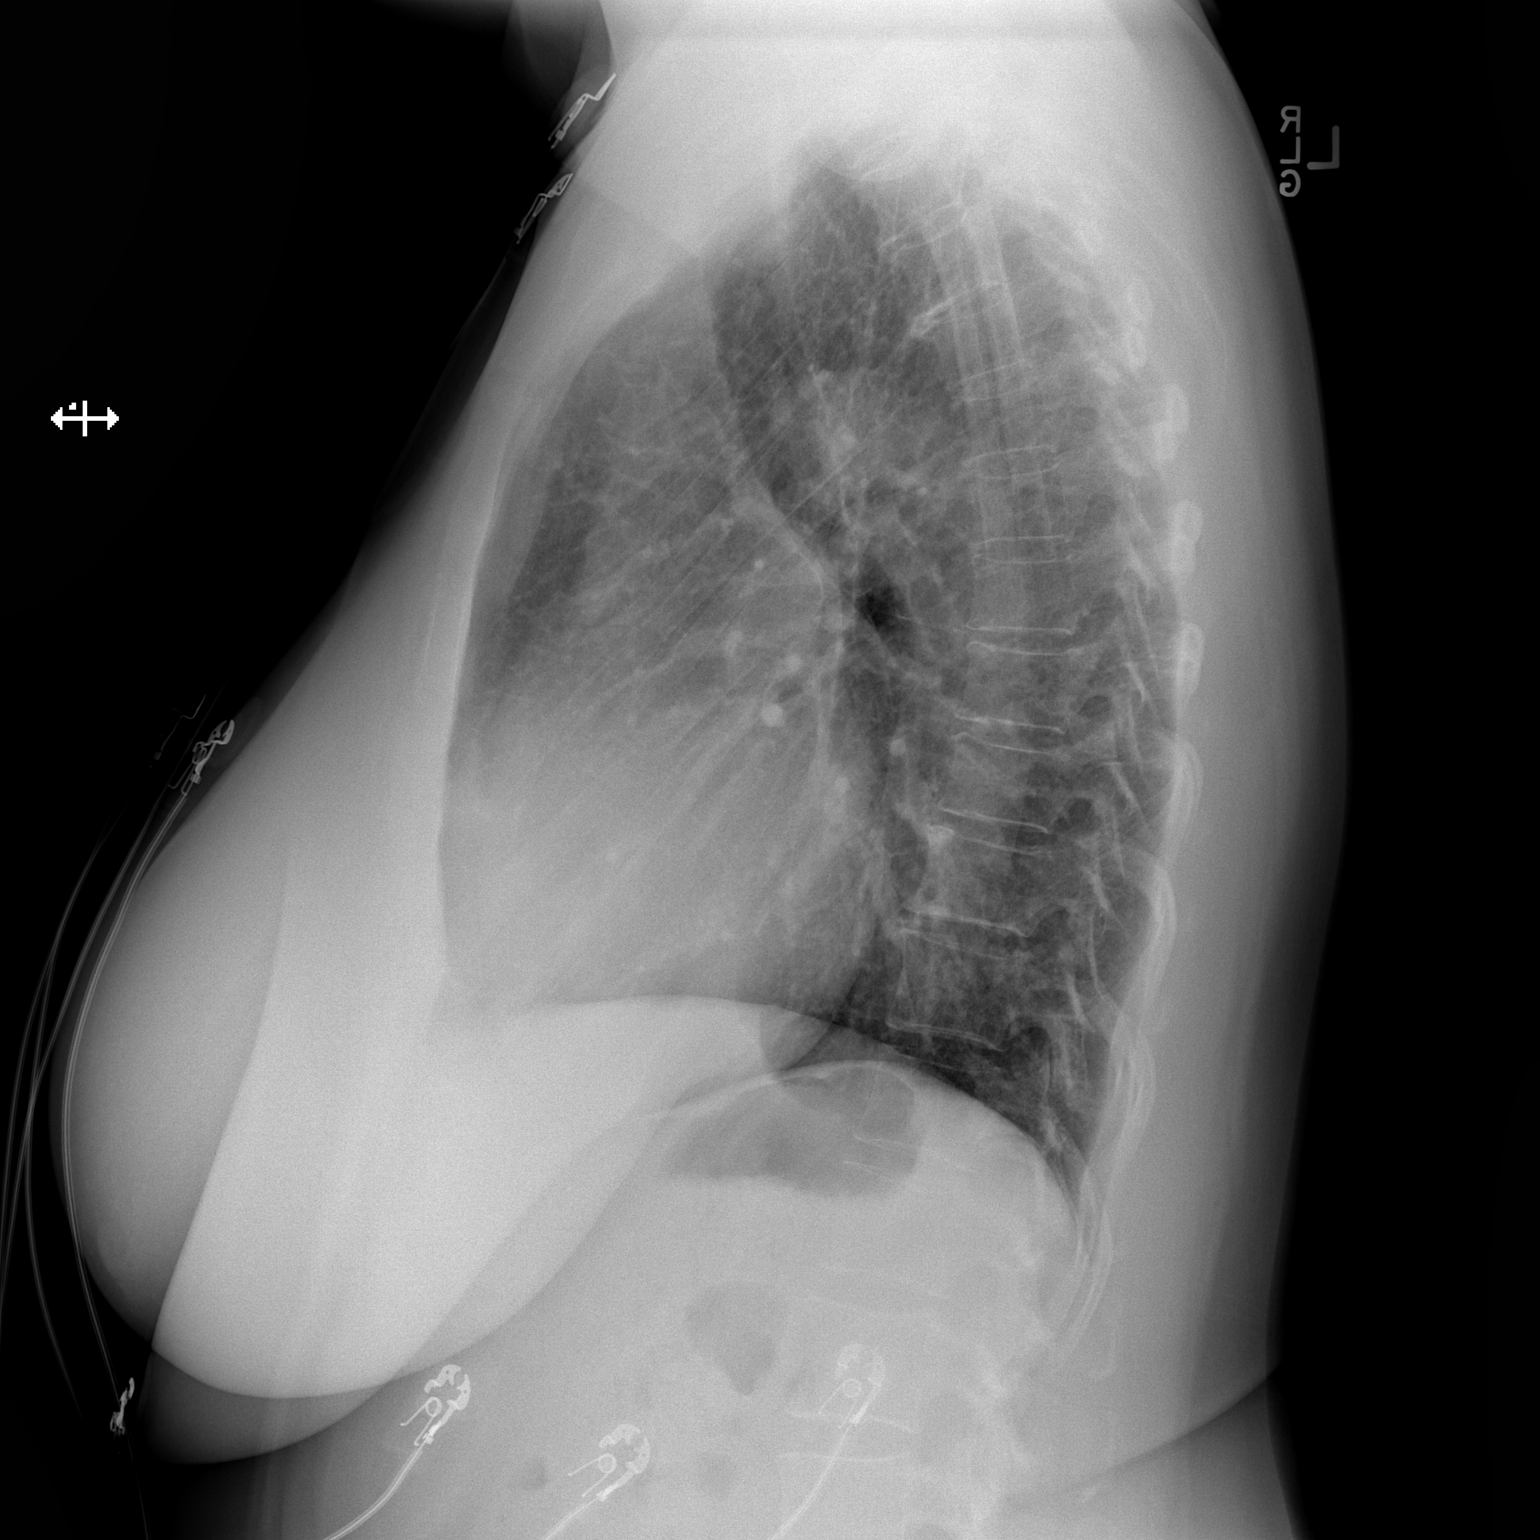

[2 of 2 positions shown; findings below may reference images not displayed]

FINDINGS: The heart size and mediastinal contours are within normal limits.
Both lungs are clear. The visualized skeletal structures are
unremarkable.
IMPRESSION: No active cardiopulmonary disease.

## 2016-04-29 ENCOUNTER — Ambulatory Visit: Payer: Self-pay

## 2016-05-16 IMAGING — DX DG CHEST 2V
2 series · 2 of 2 positions shown · non-contrast
Comparison: Prior chest x-ray 05/18/2015

CLINICAL DATA: 49-year-old female with 3 day history of left chest
pain

EXAM:
CHEST  2 VIEW

[chest pa]
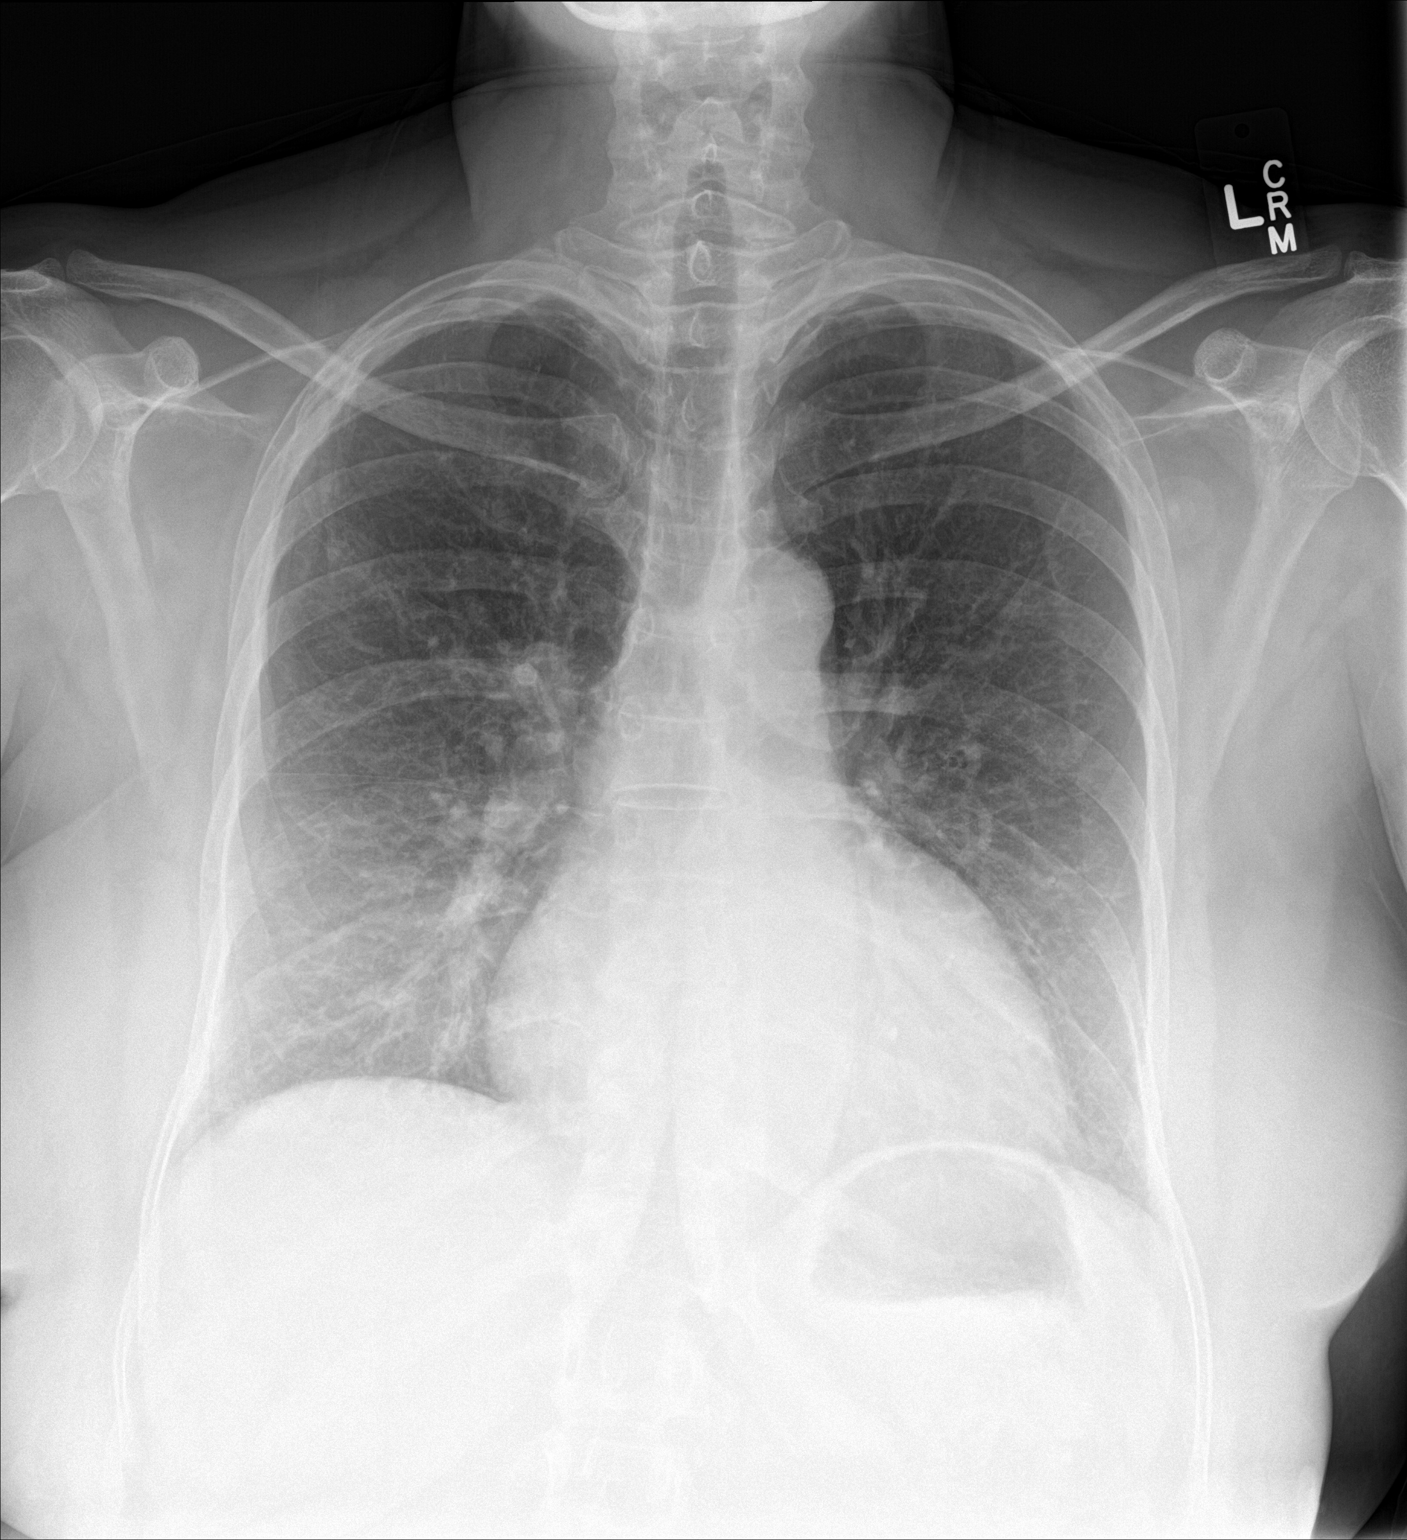

[chest lat]
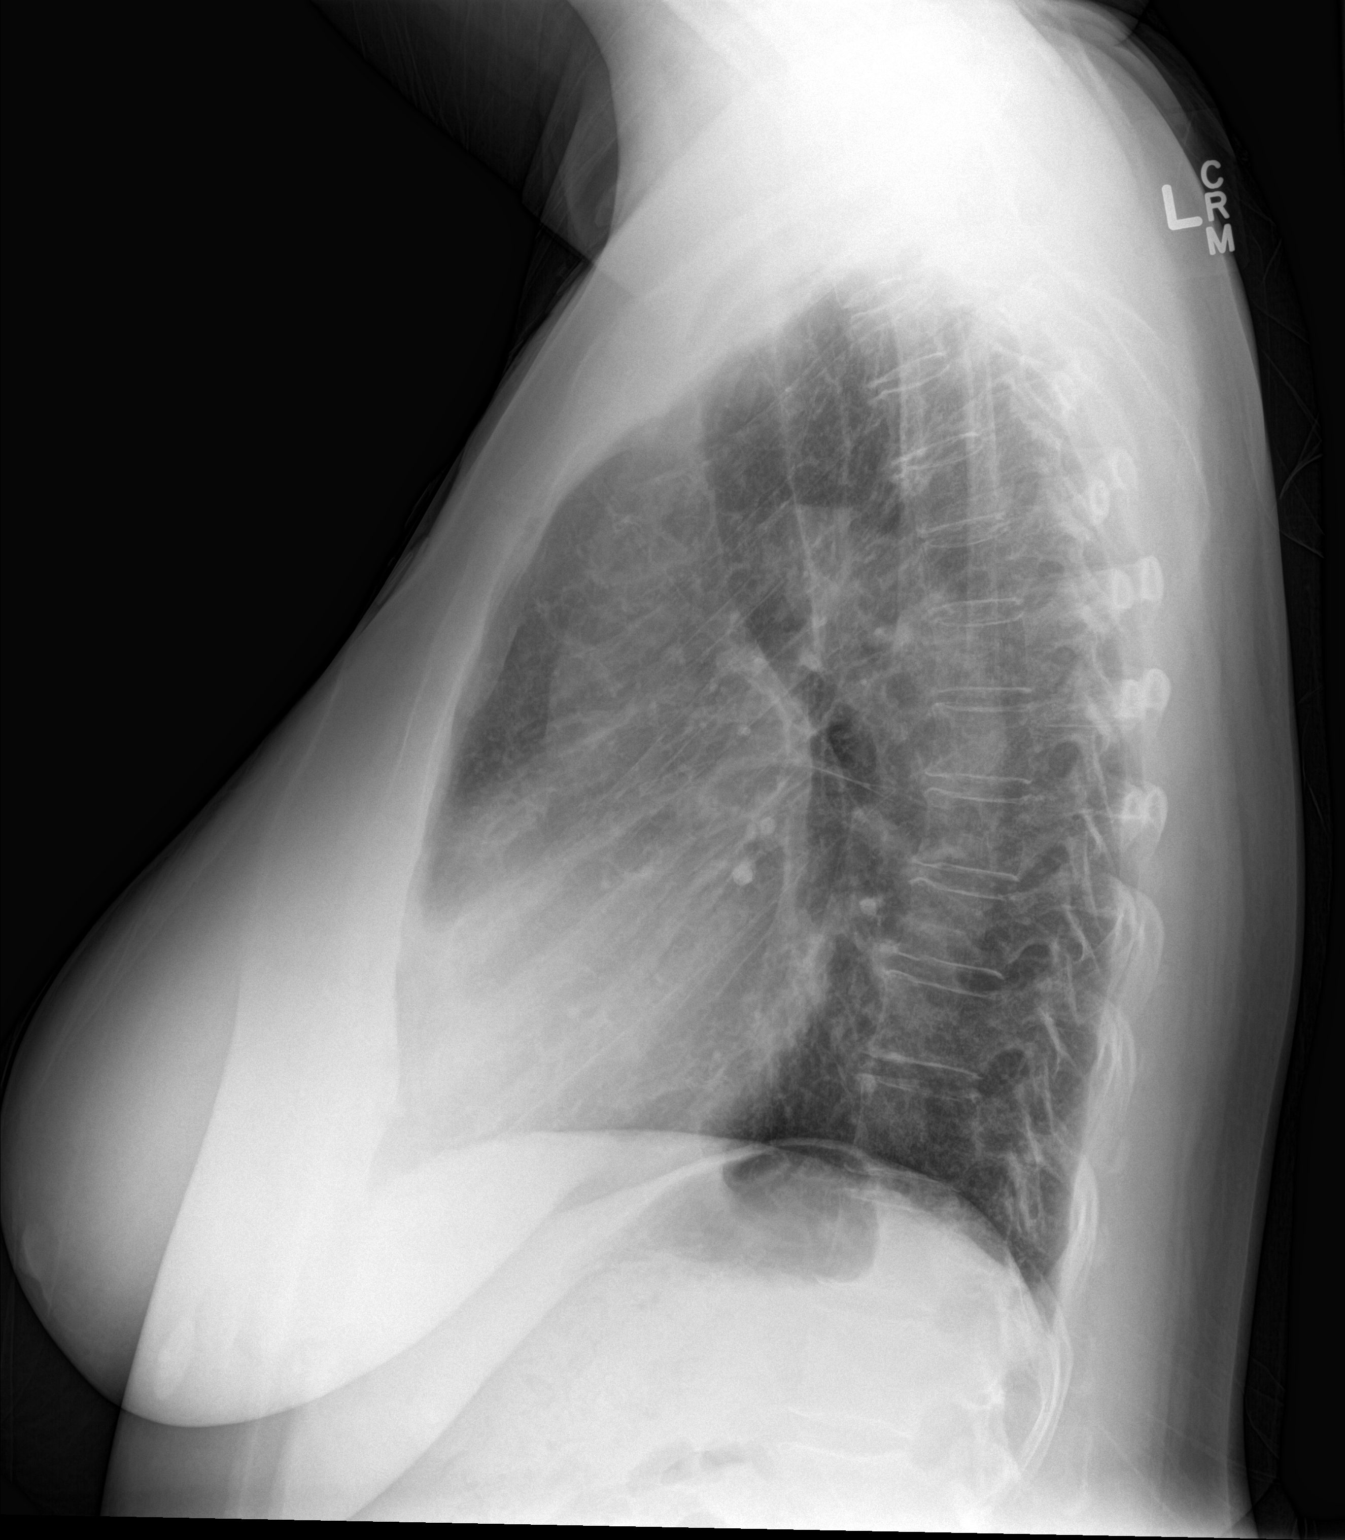

[2 of 2 positions shown; findings below may reference images not displayed]

FINDINGS: Stable mild cardiomegaly. Trace atherosclerotic calcification in the
transverse aorta. No focal airspace consolidation, pulmonary edema,
pleural effusion or pneumothorax. Relative prominence of the basilar
interstitial markings likely secondary to superimposition of
overlying soft tissues. Cardiac electrode artifacts project over the
right upper lung. No acute osseous abnormality.
IMPRESSION: No active cardiopulmonary disease.

## 2016-07-11 ENCOUNTER — Encounter (HOSPITAL_COMMUNITY): Payer: Self-pay | Admitting: *Deleted

## 2016-07-11 ENCOUNTER — Inpatient Hospital Stay (HOSPITAL_COMMUNITY)
Admission: AD | Admit: 2016-07-11 | Discharge: 2016-07-11 | Disposition: A | Discharge status: Home / Self Care | Payer: Self-pay | Source: Ambulatory Visit | Attending: Obstetrics and Gynecology | Admitting: Obstetrics and Gynecology

## 2016-07-11 DIAGNOSIS — F17210 Nicotine dependence, cigarettes, uncomplicated: Secondary | ICD-10-CM | POA: Insufficient documentation

## 2016-07-11 DIAGNOSIS — Z79899 Other long term (current) drug therapy: Secondary | ICD-10-CM | POA: Insufficient documentation

## 2016-07-11 DIAGNOSIS — I509 Heart failure, unspecified: Secondary | ICD-10-CM | POA: Insufficient documentation

## 2016-07-11 DIAGNOSIS — Z8249 Family history of ischemic heart disease and other diseases of the circulatory system: Secondary | ICD-10-CM | POA: Insufficient documentation

## 2016-07-11 DIAGNOSIS — Z7982 Long term (current) use of aspirin: Secondary | ICD-10-CM | POA: Insufficient documentation

## 2016-07-11 DIAGNOSIS — I110 Hypertensive heart disease with heart failure: Secondary | ICD-10-CM | POA: Insufficient documentation

## 2016-07-11 DIAGNOSIS — R109 Unspecified abdominal pain: Secondary | ICD-10-CM | POA: Insufficient documentation

## 2016-07-11 DIAGNOSIS — M797 Fibromyalgia: Secondary | ICD-10-CM | POA: Insufficient documentation

## 2016-07-11 DIAGNOSIS — R102 Pelvic and perineal pain: Secondary | ICD-10-CM

## 2016-07-11 DIAGNOSIS — G8929 Other chronic pain: Secondary | ICD-10-CM

## 2016-07-11 DIAGNOSIS — Z9851 Tubal ligation status: Secondary | ICD-10-CM | POA: Insufficient documentation

## 2016-07-11 HISTORY — DX: Heart failure, unspecified: I50.9

## 2016-07-11 LAB — WET PREP, GENITAL
Clue Cells Wet Prep HPF POC: NONE SEEN
SPERM: NONE SEEN
Trich, Wet Prep: NONE SEEN
WBC, Wet Prep HPF POC: NONE SEEN
YEAST WET PREP: NONE SEEN

## 2016-07-11 LAB — POCT PREGNANCY, URINE: Preg Test, Ur: NEGATIVE

## 2016-07-11 LAB — URINALYSIS, ROUTINE W REFLEX MICROSCOPIC
BILIRUBIN URINE: NEGATIVE
GLUCOSE, UA: NEGATIVE mg/dL
KETONES UR: NEGATIVE mg/dL
LEUKOCYTES UA: NEGATIVE
Nitrite: NEGATIVE
PH: 5 (ref 5.0–8.0)
Protein, ur: NEGATIVE mg/dL
Specific Gravity, Urine: 1.023 (ref 1.005–1.030)

## 2016-07-11 NOTE — MAU Provider Note (Signed)
History     CSN: [id number]  Arrival date and time: 07/11/16 1730   First Provider Initiated Contact with Patient 07/11/16 1853      Chief Complaint  Patient presents with  . Abdominal Pain   HPI   [name] is a 51 y.o. female G2P1102 here with vaginal pain that has been going on for months. The pain at times is also in her abdomen. The pain is shooting at times all across her abdomen. She denies pain at this time.; she rates her pain 0/10.  Patient is here with a friend who is also being seen in the MAU. She wants to get checked out while she is here.   She is here in MAU to be tested for STI's and UTI.   OB History    Gravida Para Term Preterm AB Living   2 2 1 1   2    SAB TAB Ectopic Multiple Live Births                  Past Medical History:  Diagnosis Date  . Abdominal hernia   . Anemia   . CHF (congestive heart failure) ([place])   . Fibromyalgia   . Fibromyalgia   . Headache   . Hypertension   . Numbness on right side   . Wears dentures    top    Past Surgical History:  Procedure Laterality Date  . 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 CESAREAN SECTION    . HERNIA REPAIR    . INCISE AND DRAIN ABCESS  2005   rt middle finger   . MASS EXCISION Right 08/06/2012   Procedure: EXCISION OF LARGE MASS right FLANK WITH LIPO ASSISTANCE;  Surgeon: Cristine Polio, MD;  Location: [facility];  Service: Plastics;  Laterality: Right;  . TUBAL LIGATION      Family History  Problem Relation Age of Onset  . Hypertension Mother   . Heart disease Father   . Heart disease Brother     Social History  Substance Use Topics  . Smoking status: Current Every Day Smoker    Packs/day: 0.25    Types: Cigarettes  . Smokeless tobacco: Never Used  . Alcohol use No     Comment: denies  use 03/31/2016     Allergies: No Known Allergies  Prescriptions Prior to Admission  Medication Sig Dispense Refill Last Dose  . aspirin 81 MG chewable tablet Chew 81 mg by mouth daily.   07/10/2016 at Unknown time  . atorvastatin (LIPITOR) 40 MG tablet Take 40 mg by mouth daily.   Past Week at Unknown time  . CALCIUM PO Take 1 tablet by mouth daily.   07/10/2016 at Unknown time  . carvedilol (COREG) 3.125 MG tablet Take 3.125 mg by mouth 2 (two) times daily.   07/10/2016 at 1800  . furosemide (LASIX) 40 MG tablet Take 40 mg by mouth daily.   07/10/2016 at Unknown time  . spironolactone (ALDACTONE) 25 MG tablet Take 25 mg by mouth daily.   07/10/2016 at Unknown time  . losartan (COZAAR) 25 MG tablet Take 1 tablet (25 mg total) by mouth daily. (Patient not taking: Reported on 07/11/2016) 30 tablet 0 Not Taking at Unknown time   Results for orders placed or performed during the hospital encounter of 07/11/16 (from the past 48  hour(s))  Urinalysis, Routine w reflex microscopic     Status: Abnormal   Collection Time: 07/11/16  5:35 PM  Result Value Ref Range   Color, Urine YELLOW YELLOW   APPearance CLEAR CLEAR   Specific Gravity, Urine 1.023 1.005 - 1.030   pH 5.0 5.0 - 8.0   Glucose, UA NEGATIVE NEGATIVE mg/dL   Hgb urine dipstick SMALL (A) NEGATIVE   Bilirubin Urine NEGATIVE NEGATIVE   Ketones, ur NEGATIVE NEGATIVE mg/dL   Protein, ur NEGATIVE NEGATIVE mg/dL   Nitrite NEGATIVE NEGATIVE   Leukocytes, UA NEGATIVE NEGATIVE   RBC / HPF 0-5 0 - 5 RBC/hpf   WBC, UA 0-5 0 - 5 WBC/hpf   Bacteria, UA RARE (A) NONE SEEN   Squamous Epithelial / LPF 0-5 (A) NONE SEEN   Mucous PRESENT   Pregnancy, urine POC     Status: None   Collection Time: 07/11/16  5:46 PM  Result Value Ref Range   Preg Test, Ur NEGATIVE NEGATIVE    Comment:        THE SENSITIVITY OF THIS METHODOLOGY IS >24 mIU/mL   Wet prep, genital     Status: None   Collection Time: 07/11/16  6:19 PM  Result Value Ref Range   Yeast  Wet Prep HPF POC NONE SEEN NONE SEEN   Trich, Wet Prep NONE SEEN NONE SEEN   Clue Cells Wet Prep HPF POC NONE SEEN NONE SEEN   WBC, Wet Prep HPF POC NONE SEEN NONE SEEN   Sperm NONE SEEN     Review of Systems  Constitutional: Negative for fever.  Gastrointestinal: Negative for nausea and vomiting.  Genitourinary: Negative for dysuria, flank pain, vaginal bleeding, vaginal discharge and vaginal pain.  Musculoskeletal: Negative for back pain.   Physical Exam   Blood pressure 145/84, pulse 86, temperature 98.2 F (36.8 C), temperature source Oral, resp. rate 18, height 5\' 9"  (1.753 m), weight 188 lb (85.3 kg), last menstrual period 05/08/2013, SpO2 98 %.  Physical Exam  Constitutional: She is oriented to person, place, and time. She appears well-developed and well-nourished. No distress.  HENT:  Head: Normocephalic.  Respiratory: Effort normal.  GI: Soft. She exhibits no distension and no mass. There is no tenderness. There is no rebound and no guarding.  Genitourinary:  Genitourinary Comments: Wet prep and Gc collected without speculum by RN.   Musculoskeletal: Normal range of motion.  Neurological: She is alert and oriented to person, place, and time.  Skin: Skin is warm. She is not diaphoretic.  Psychiatric: Her behavior is normal.    MAU Course  Procedures  None  MDM  UA & UPT Gc & Wet prep  Assessment and Plan   A:  1. Chronic abdominal pain   2. Vaginal pain    P:  Discharge home in stable condition Follow up with PCP as this is a chronic problem Return to MAU for GYN emergencies  Lezlie Lye, NP 07/13/2016 9:00 AM

## 2016-07-11 NOTE — Discharge Instructions (Signed)

## 2016-07-11 NOTE — MAU Note (Signed)
Pt states she thinks she has a yeast infection. Pt reports lower abdominal pain from time to time. Pt reports discharge with a bad smell.

## 2016-07-12 LAB — GC/CHLAMYDIA PROBE AMP (~~LOC~~) NOT AT ARMC
CHLAMYDIA, DNA PROBE: NEGATIVE
NEISSERIA GONORRHEA: NEGATIVE

## 2016-09-19 ENCOUNTER — Ambulatory Visit: Payer: Self-pay | Admitting: Family Medicine

## 2016-10-05 ENCOUNTER — Encounter (HOSPITAL_COMMUNITY): Payer: Self-pay | Admitting: Emergency Medicine

## 2016-10-05 ENCOUNTER — Emergency Department (HOSPITAL_COMMUNITY): Payer: Self-pay

## 2016-10-05 ENCOUNTER — Emergency Department (HOSPITAL_COMMUNITY)
Admission: EM | Admit: 2016-10-05 | Discharge: 2016-10-06 | Disposition: A | Discharge status: Home / Self Care | Payer: Self-pay | Attending: Emergency Medicine | Admitting: Emergency Medicine

## 2016-10-05 ENCOUNTER — Encounter (HOSPITAL_COMMUNITY): Payer: Self-pay | Admitting: *Deleted

## 2016-10-05 ENCOUNTER — Ambulatory Visit (HOSPITAL_COMMUNITY)
Admission: EM | Admit: 2016-10-05 | Discharge: 2016-10-05 | Disposition: A | Discharge status: Other Acute Inpatient Hospital | Payer: Self-pay | Attending: Internal Medicine | Admitting: Internal Medicine

## 2016-10-05 DIAGNOSIS — I509 Heart failure, unspecified: Secondary | ICD-10-CM | POA: Insufficient documentation

## 2016-10-05 DIAGNOSIS — B349 Viral infection, unspecified: Secondary | ICD-10-CM | POA: Insufficient documentation

## 2016-10-05 DIAGNOSIS — F17210 Nicotine dependence, cigarettes, uncomplicated: Secondary | ICD-10-CM | POA: Insufficient documentation

## 2016-10-05 DIAGNOSIS — Z79899 Other long term (current) drug therapy: Secondary | ICD-10-CM | POA: Insufficient documentation

## 2016-10-05 DIAGNOSIS — I110 Hypertensive heart disease with heart failure: Secondary | ICD-10-CM | POA: Insufficient documentation

## 2016-10-05 DIAGNOSIS — R0602 Shortness of breath: Secondary | ICD-10-CM

## 2016-10-05 DIAGNOSIS — R000 Tachycardia, unspecified: Secondary | ICD-10-CM

## 2016-10-05 DIAGNOSIS — Z9114 Patient's other noncompliance with medication regimen: Secondary | ICD-10-CM | POA: Insufficient documentation

## 2016-10-05 DIAGNOSIS — Z8679 Personal history of other diseases of the circulatory system: Secondary | ICD-10-CM

## 2016-10-05 DIAGNOSIS — Z7982 Long term (current) use of aspirin: Secondary | ICD-10-CM | POA: Insufficient documentation

## 2016-10-05 LAB — CBC WITH DIFFERENTIAL/PLATELET
BASOS ABS: 0.1 10*3/uL (ref 0.0–0.1)
BASOS PCT: 1 %
Eosinophils Absolute: 0.4 10*3/uL (ref 0.0–0.7)
Eosinophils Relative: 7 %
HEMATOCRIT: 38.9 % (ref 36.0–46.0)
HEMOGLOBIN: 12.4 g/dL (ref 12.0–15.0)
Lymphocytes Relative: 43 %
Lymphs Abs: 2.7 10*3/uL (ref 0.7–4.0)
MCH: 27.1 pg (ref 26.0–34.0)
MCHC: 31.9 g/dL (ref 30.0–36.0)
MCV: 85.1 fL (ref 78.0–100.0)
Monocytes Absolute: 0.8 10*3/uL (ref 0.1–1.0)
Monocytes Relative: 12 %
NEUTROS ABS: 2.2 10*3/uL (ref 1.7–7.7)
NEUTROS PCT: 37 %
Platelets: 218 10*3/uL (ref 150–400)
RBC: 4.57 MIL/uL (ref 3.87–5.11)
RDW: 14.1 % (ref 11.5–15.5)
WBC: 6.1 10*3/uL (ref 4.0–10.5)

## 2016-10-05 LAB — I-STAT CHEM 8, ED
BUN: 11 mg/dL (ref 6–20)
CREATININE: 0.6 mg/dL (ref 0.44–1.00)
Calcium, Ion: 1.08 mmol/L — ABNORMAL LOW (ref 1.15–1.40)
Chloride: 108 mmol/L (ref 101–111)
Glucose, Bld: 145 mg/dL — ABNORMAL HIGH (ref 65–99)
HEMATOCRIT: 38 % (ref 36.0–46.0)
Hemoglobin: 12.9 g/dL (ref 12.0–15.0)
POTASSIUM: 3.8 mmol/L (ref 3.5–5.1)
SODIUM: 141 mmol/L (ref 135–145)
TCO2: 23 mmol/L (ref 0–100)

## 2016-10-05 LAB — RAPID STREP SCREEN (MED CTR MEBANE ONLY): STREPTOCOCCUS, GROUP A SCREEN (DIRECT): NEGATIVE

## 2016-10-05 MED ORDER — CARVEDILOL 3.125 MG PO TABS
3.1250 mg | ORAL_TABLET | Freq: Two times a day (BID) | ORAL | Status: DC
  Administered 2016-10-05: 3.125 mg via ORAL
  Filled 2016-10-05: qty 1

## 2016-10-05 MED ORDER — FUROSEMIDE 20 MG PO TABS
40.0000 mg | ORAL_TABLET | Freq: Every day | ORAL | Status: DC
  Administered 2016-10-05: 40 mg via ORAL
  Filled 2016-10-05: qty 2

## 2016-10-05 MED ORDER — SPIRONOLACTONE 25 MG PO TABS
25.0000 mg | ORAL_TABLET | Freq: Every day | ORAL | Status: DC
  Administered 2016-10-05: 25 mg via ORAL
  Filled 2016-10-05: qty 1

## 2016-10-05 NOTE — ED Triage Notes (Signed)
Pt presents with [facility] from [facility] for cp, sob, cough x 4 days, sorethroat, "a knot under my chin," and abd pain. Hx of fibromyalgia. Pt has been having generalized bodyaches and weakness for the past two days.

## 2016-10-05 NOTE — ED Provider Notes (Signed)
[facility] DEPT Provider Note   CSN: [id number] Arrival date & time: 10/05/16  2120     History   Chief Complaint Chief Complaint  Patient presents with  . Shortness of Breath  . Generalized Body Aches    HPI Robin Pace is a 51 y.o. female, CHF, cardiomyopathy, hypertension, hyperlipidemia, and anemia who presents with URI symptoms, sore throat, swollen glands.  This is a 51 year old female with history of fibromyalgia, CHF, anemia, hypertension, hyperlipidemia.  Presents with URI symptoms for the past 2 days, sore throat, swollen glands, cough, abdominal pain and subjective peripheral edema.  She states she has not taken any of her medication in 2 days.  She did pick it up today, but has not had time to take any of it as of yet today.   She was seen at urgent care and they felt that they could not adequately diagnose or treat her at their facility and sent her to the emergency department for further evaluation due to her history      Past Medical History:  Diagnosis Date  . Abdominal hernia   . Anemia   . CHF (congestive heart failure) ([place])   . Fibromyalgia   . Fibromyalgia   . Headache   . Hypertension   . Numbness on right side   . Wears dentures    top    Patient Active Problem List   Diagnosis Date Noted  . Neck pain on right side 09/10/2013  . Low back pain 09/10/2013  . Headache   . Numbness on right side     Past Surgical History:  Procedure Laterality Date  . ABDOMINOPLASTY N/A 08/12/2013   Procedure: 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;  Surgeon: Cristine Polio, MD;  Location: [facility];  Service: Plastics;  Laterality: N/A;  . CESAREAN SECTION    . HERNIA REPAIR    . INCISE AND DRAIN ABCESS  2005   rt middle finger   . MASS EXCISION Right 08/06/2012   Procedure: EXCISION OF LARGE MASS right FLANK WITH LIPO ASSISTANCE;  Surgeon: Cristine Polio, MD;  Location: [facility];  Service:  Plastics;  Laterality: Right;  . TUBAL LIGATION      OB History    Gravida Para Term Preterm AB Living   2 2 1 1   2    SAB TAB Ectopic Multiple Live Births                   Home Medications    Prior to Admission medications   Medication Sig Start Date End Date Taking? Authorizing Provider  aspirin 81 MG chewable tablet Chew 81 mg by mouth daily. 11/26/15   [provider]  atorvastatin (LIPITOR) 40 MG tablet Take 40 mg by mouth daily.    [provider]  CALCIUM PO Take 1 tablet by mouth daily.    [provider]  carvedilol (COREG) 3.125 MG tablet Take 3.125 mg by mouth 2 (two) times daily. 12/16/15   [provider]  furosemide (LASIX) 40 MG tablet Take 40 mg by mouth daily. 12/16/15   [provider]  losartan (COZAAR) 25 MG tablet Take 1 tablet (25 mg total) by mouth daily. 08/02/15 01/20/16  Gareth Morgan, MD  spironolactone (ALDACTONE) 25 MG tablet Take 25 mg by mouth daily. 12/16/15   [provider]    Family History Family History  Problem Relation Age of Onset  . Hypertension Mother   . Heart disease  Father   . Heart disease Brother     Social History Social History  Substance Use Topics  . Smoking status: Current Every Day Smoker    Packs/day: 0.25    Types: Cigarettes  . Smokeless tobacco: Never Used  . Alcohol use No     Comment: denies use 03/31/2016      Allergies   Patient has no known allergies.   Review of Systems Review of Systems  Constitutional: Negative for fever.  HENT: Positive for congestion, rhinorrhea and sore throat.   Respiratory: Positive for cough and shortness of breath.   Cardiovascular: Negative for chest pain.  Gastrointestinal: Positive for abdominal pain.  Musculoskeletal: Positive for myalgias.  All other systems reviewed and are negative.    Physical Exam Updated Vital Signs BP (!) 156/105   Pulse (!) 101   Temp 98.6 F (37 C) (Oral)   Resp 20   LMP 05/08/2013    SpO2 94%   Physical Exam  Constitutional: She appears well-developed and well-nourished.  HENT:  Head: Normocephalic.  Eyes: Pupils are equal, round, and reactive to light.  Neck: Normal range of motion.  Cardiovascular: Normal rate and regular rhythm.   Pulmonary/Chest: Effort normal and breath sounds normal.  Abdominal: Soft.  Musculoskeletal: Normal range of motion. She exhibits no edema.  Neurological: She is alert.  Skin: Skin is warm. No rash noted.  Psychiatric: She has a normal mood and affect.  Nursing note and vitals reviewed.    ED Treatments / Results  Labs (all labs ordered are listed, but only abnormal results are displayed) Labs Reviewed  I-STAT CHEM 8, ED - Abnormal; Notable for the following:       Result Value   Glucose, Bld 145 (*)    Calcium, Ion 1.08 (*)    All other components within normal limits  RAPID STREP SCREEN (NOT AT [facility])  CULTURE, GROUP A STREP ([place])  CBC WITH DIFFERENTIAL/PLATELET  BRAIN NATRIURETIC PEPTIDE    EKG  EKG Interpretation None       Radiology Dg Chest 2 View  Result Date: 10/05/2016 CLINICAL DATA:  Cough.  History of CHF. EXAM: CHEST  2 VIEW COMPARISON:  Radiographs and CT 11/25/2015 FINDINGS: Cardiomegaly is stable. Mild central vascular congestion. No alveolar edema. No pleural effusion, confluent airspace disease or pneumothorax. Stable osseous structures. IMPRESSION: Cardiomegaly with mild central vascular congestion. Electronically Signed   By: Jeb Levering M.D.   On: 10/05/2016 22:38    Procedures Procedures (including critical care time)  Medications Ordered in ED Medications  carvedilol (COREG) tablet 3.125 mg (3.125 mg Oral Given 10/05/16 2240)  furosemide (LASIX) tablet 40 mg (40 mg Oral Given 10/05/16 2240)  spironolactone (ALDACTONE) tablet 25 mg (25 mg Oral Given 10/05/16 2240)     Initial Impression / Assessment and Plan / ED Course  I have reviewed the triage vital signs and the nursing  notes.  Pertinent labs & imaging results that were available during my care of the patient were reviewed by me and considered in my medical decision making (see chart for details).      Patient's x-ray shows mild vascular congestion.  BNP does not support heart failure at this time.  There is no sign of pneumonia.  Patient was given her p.m. dose of antihypertensive as well as her diuretic at time of discharge, she is feeling significantly better, she's been encouraged to make sure to take her medication on a regular basis.  She states she did fill  her prescriptions.  This evening  Final Clinical Impressions(s) / ED Diagnoses   Final diagnoses:  Viral syndrome  Non compliance w medication regimen    New Prescriptions New Prescriptions   No medications on file     Junius Creamer, NP 10/06/16 [id number]    Gareth Morgan, MD 10/07/16 [phone]

## 2016-10-05 NOTE — ED Provider Notes (Signed)
CSN: [id number]     Arrival date & time 10/05/16  1837 History   None    Chief Complaint  Patient presents with  . Cough   (Consider location/radiation/quality/duration/timing/severity/associated sxs/prior Treatment) HPI  Here for cough that started on 4 days ago. Associates sore throat, and chest pain and SOB.  She is coughing up phlegm and denies any blood.  She feels that she is getting worse.  She has tried Dayquil, Nyquil, mucinex, Theraflu and these are not helping her. She denies a history of asthma.  She smokes 1/4 packk daily since age 23 thus a ten pack year history. Denies orthopnea (seen sleeping flat on the exam table.)  Past Medical History:  Diagnosis Date  . Abdominal hernia   . Anemia   . CHF (congestive heart failure) ([place])   . Fibromyalgia   . Fibromyalgia   . Headache   . Hypertension   . Numbness on right side   . Wears dentures    top   Past Surgical History:  Procedure Laterality Date  . 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 CESAREAN SECTION    . HERNIA REPAIR    . INCISE AND DRAIN ABCESS  2005   rt middle finger   . MASS EXCISION Right 08/06/2012   Procedure: EXCISION OF LARGE MASS right FLANK WITH LIPO ASSISTANCE;  Surgeon: Cristine Polio, MD;  Location: [facility];  Service: Plastics;  Laterality: Right;  . TUBAL LIGATION     Family History  Problem Relation Age of Onset  . Hypertension Mother   . Heart disease Father   . Heart disease Brother    Social History  Substance Use Topics  . Smoking status: Current Every Day Smoker    Packs/day: 0.25    Types: Cigarettes  . Smokeless tobacco: Never Used  . Alcohol use No     Comment: denies use 03/31/2016    OB History    Gravida Para Term Preterm AB Living   2 2 1 1   2    SAB TAB Ectopic Multiple Live  Births                 Review of Systems  HENT: Negative for congestion.   Respiratory: Positive for chest tightness and shortness of breath. Negative for wheezing.   Cardiovascular: Positive for leg swelling. Negative for chest pain.  Neurological: Negative for dizziness.    Allergies  Patient has no known allergies.  Home Medications    Meds Ordered and Administered this Visit  Medications - No data to display  BP (!) 153/97 (BP Location: Right Arm) Comment: has not taken any htn medicines today  Pulse (!) 107   Temp 98.6 F (37 C) (Oral)   Resp 20   LMP 05/08/2013   SpO2 96%  No data found.  Wt Readings from Last 3 Encounters:  07/11/16 188 lb (85.3 kg)  03/31/16 189 lb (85.7 kg)  01/20/16 180 lb (81.6 kg)    Physical Exam  Constitutional: She appears well-developed and well-nourished. No distress.  Cardiovascular: Regular rhythm.  Tachycardia present.  Exam reveals no gallop and no friction rub.   No murmur heard. Pulmonary/Chest: Effort normal. No respiratory distress. She has no wheezes. She has no rales. She exhibits no tenderness.  Abdominal: She exhibits no distension. There is no tenderness.  Musculoskeletal: She exhibits no edema, tenderness or deformity.  Skin: She is not diaphoretic.    Urgent Care Course     Procedures (including critical care time)  Labs Review Labs Reviewed - No data to display  Imaging Review No results found.   MDM   1. Tachycardia   2. Shortness of breath   3. History of heart failure    Her symptoms are certainly out of proportion to exam. She tells me that she has had to wear a defibulator vest in the pain.  She has not taken her medications today and has them in the car.  She needs further work up before discharge.  Send to [facility] via [facility].      Tereasa Coop, PA-C 10/05/16 2040    Tereasa Coop, PA-C 10/05/16 2042

## 2016-10-05 NOTE — ED Triage Notes (Signed)
Cough onset Sunday, weakness yesterday, fullness and tenderness under chin.  And patient feels weak.  Patient vomited on Monday, none since Monday, no diarrhea.  No fever, but has chills

## 2016-10-05 NOTE — ED Notes (Signed)
Placed  On  Cardiac  Monitor  Nasal o2   At  2  l /  Min        Iv  Saline  Lck  20  Angio  l  Hand  1  Att  Site  patent

## 2016-10-06 LAB — BRAIN NATRIURETIC PEPTIDE: B NATRIURETIC PEPTIDE 5: 10.3 pg/mL (ref 0.0–100.0)

## 2016-10-06 NOTE — Discharge Instructions (Signed)
Given on your normal pressure medicine as well as a dose of Lasix your chest x-ray shows no pneumonia or heart failure.  Please measures taken medication on a regular basis.

## 2016-10-08 LAB — CULTURE, GROUP A STREP (THRC)

## 2017-02-06 ENCOUNTER — Other Ambulatory Visit: Payer: Self-pay

## 2017-02-06 ENCOUNTER — Emergency Department (HOSPITAL_COMMUNITY): Payer: Self-pay

## 2017-02-06 ENCOUNTER — Emergency Department (HOSPITAL_COMMUNITY)
Admission: EM | Admit: 2017-02-06 | Discharge: 2017-02-06 | Disposition: A | Discharge status: Home / Self Care | Payer: Self-pay | Attending: Emergency Medicine | Admitting: Emergency Medicine

## 2017-02-06 ENCOUNTER — Encounter (HOSPITAL_COMMUNITY): Payer: Self-pay

## 2017-02-06 DIAGNOSIS — Z79899 Other long term (current) drug therapy: Secondary | ICD-10-CM | POA: Insufficient documentation

## 2017-02-06 DIAGNOSIS — I509 Heart failure, unspecified: Secondary | ICD-10-CM | POA: Insufficient documentation

## 2017-02-06 DIAGNOSIS — J189 Pneumonia, unspecified organism: Secondary | ICD-10-CM

## 2017-02-06 DIAGNOSIS — F17210 Nicotine dependence, cigarettes, uncomplicated: Secondary | ICD-10-CM | POA: Insufficient documentation

## 2017-02-06 DIAGNOSIS — J181 Lobar pneumonia, unspecified organism: Secondary | ICD-10-CM | POA: Insufficient documentation

## 2017-02-06 DIAGNOSIS — I10 Essential (primary) hypertension: Secondary | ICD-10-CM | POA: Insufficient documentation

## 2017-02-06 DIAGNOSIS — R072 Precordial pain: Secondary | ICD-10-CM

## 2017-02-06 DIAGNOSIS — Z7982 Long term (current) use of aspirin: Secondary | ICD-10-CM | POA: Insufficient documentation

## 2017-02-06 LAB — COMPREHENSIVE METABOLIC PANEL
ALT: 24 U/L (ref 14–54)
AST: 19 U/L (ref 15–41)
Albumin: 3.9 g/dL (ref 3.5–5.0)
Alkaline Phosphatase: 71 U/L (ref 38–126)
Anion gap: 11 (ref 5–15)
BUN: 18 mg/dL (ref 6–20)
CHLORIDE: 106 mmol/L (ref 101–111)
CO2: 24 mmol/L (ref 22–32)
Calcium: 9.5 mg/dL (ref 8.9–10.3)
Creatinine, Ser: 0.83 mg/dL (ref 0.44–1.00)
Glucose, Bld: 121 mg/dL — ABNORMAL HIGH (ref 65–99)
POTASSIUM: 3.9 mmol/L (ref 3.5–5.1)
SODIUM: 141 mmol/L (ref 135–145)
Total Bilirubin: 0.5 mg/dL (ref 0.3–1.2)
Total Protein: 7.1 g/dL (ref 6.5–8.1)

## 2017-02-06 LAB — CBC WITH DIFFERENTIAL/PLATELET
BASOS ABS: 0 10*3/uL (ref 0.0–0.1)
Basophils Relative: 0 %
Eosinophils Absolute: 0.2 10*3/uL (ref 0.0–0.7)
Eosinophils Relative: 4 %
HEMATOCRIT: 40.8 % (ref 36.0–46.0)
HEMOGLOBIN: 13.1 g/dL (ref 12.0–15.0)
LYMPHS PCT: 36 %
Lymphs Abs: 2 10*3/uL (ref 0.7–4.0)
MCH: 27.7 pg (ref 26.0–34.0)
MCHC: 32.1 g/dL (ref 30.0–36.0)
MCV: 86.3 fL (ref 78.0–100.0)
Monocytes Absolute: 0.6 10*3/uL (ref 0.1–1.0)
Monocytes Relative: 10 %
NEUTROS ABS: 2.9 10*3/uL (ref 1.7–7.7)
NEUTROS PCT: 50 %
PLATELETS: 239 10*3/uL (ref 150–400)
RBC: 4.73 MIL/uL (ref 3.87–5.11)
RDW: 14 % (ref 11.5–15.5)
WBC: 5.7 10*3/uL (ref 4.0–10.5)

## 2017-02-06 LAB — I-STAT TROPONIN, ED: Troponin i, poc: 0 ng/mL (ref 0.00–0.08)

## 2017-02-06 LAB — BRAIN NATRIURETIC PEPTIDE: B NATRIURETIC PEPTIDE 5: 16.9 pg/mL (ref 0.0–100.0)

## 2017-02-06 LAB — D-DIMER, QUANTITATIVE (NOT AT ARMC): D DIMER QUANT: 0.31 ug{FEU}/mL (ref 0.00–0.50)

## 2017-02-06 MED ORDER — ACETAMINOPHEN 325 MG PO TABS: 650.0000 mg | ORAL_TABLET | Freq: Four times a day (QID) | ORAL | 0 refills | Status: DC | PRN

## 2017-02-06 MED ORDER — MORPHINE SULFATE (PF) 4 MG/ML IV SOLN
4.0000 mg | Freq: Once | INTRAVENOUS | Status: AC
  Administered 2017-02-06: 4 mg via INTRAVENOUS
  Filled 2017-02-06: qty 1

## 2017-02-06 MED ORDER — AZITHROMYCIN 250 MG PO TABS
500.0000 mg | ORAL_TABLET | Freq: Once | ORAL | Status: AC
  Administered 2017-02-06: 500 mg via ORAL
  Filled 2017-02-06: qty 2

## 2017-02-06 MED ORDER — DOXYCYCLINE HYCLATE 100 MG PO CAPS: 100.0000 mg | ORAL_CAPSULE | Freq: Two times a day (BID) | ORAL | 0 refills | Status: DC

## 2017-02-06 NOTE — ED Triage Notes (Signed)
Pt presents to the ed with complaints of chest pain on the right side that is worse with a deep breath. Denies any other symptoms.

## 2017-02-06 NOTE — ED Notes (Signed)
Patient given sandwich and sprite per PA Will

## 2017-02-06 NOTE — ED Provider Notes (Signed)
[facility] DEPT Provider Note   CSN: [id number] Arrival date & time: 02/06/17  1406     History   Chief Complaint Chief Complaint  Patient presents with  . Chest Pain    HPI Robin Pace is a 51 y.o. female.  Robin Pace is a 51 y.o. Female who presents the emergency department complaining of chest pain ongoing since yesterday. Patient reports right-sided chest pain is worse with deep inspiration. She reports this feels similar to when she was diagnosed with congestive heart failure. She does report feeling short of breath over the past month. She sees a cardiologist in [facility] and reports she is fairly compliant with her medications. She reports she did miss her medications 2 days ago. She reports feeling more short of breath when she walks up stairs or exerts herself. She reports pain with deep inspiration of her right side of her chest. She does not report much leg swelling. She denies fevers, coughing, wheezing, abdominal pain, nausea, vomiting, leg pain, lightheadedness or syncope or rashes.    The history is provided by the patient and medical records. No language interpreter was used.  Chest Pain   Associated symptoms include shortness of breath. Pertinent negatives include no abdominal pain, no back pain, no cough, no dizziness, no fever, no headaches, no nausea, no numbness, no palpitations and no vomiting.    Past Medical History:  Diagnosis Date  . Abdominal hernia   . Anemia   . CHF (congestive heart failure) ([place])   . Fibromyalgia   . Fibromyalgia   . Headache   . Hypertension   . Numbness on right side   . Wears dentures    top    Patient Active Problem List   Diagnosis Date Noted  . Neck pain on right side 09/10/2013  . Low back pain 09/10/2013  . Headache   . Numbness on right side     Past Surgical History:  Procedure Laterality Date  . ABDOMINOPLASTY N/A 08/12/2013   Procedure: 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;   Surgeon: Cristine Polio, MD;  Location: [facility];  Service: Plastics;  Laterality: N/A;  . CESAREAN SECTION    . HERNIA REPAIR    . INCISE AND DRAIN ABCESS  2005   rt middle finger   . MASS EXCISION Right 08/06/2012   Procedure: EXCISION OF LARGE MASS right FLANK WITH LIPO ASSISTANCE;  Surgeon: Cristine Polio, MD;  Location: [facility];  Service: Plastics;  Laterality: Right;  . TUBAL LIGATION      OB History    Gravida Para Term Preterm AB Living   2 2 1 1   2    SAB TAB Ectopic Multiple Live Births                   Home Medications    Prior to Admission medications   Medication Sig Start Date End Date Taking? Authorizing Provider  aspirin 81 MG chewable tablet Chew 81 mg by mouth daily. 11/26/15  Yes [provider]  atorvastatin (LIPITOR) 40 MG tablet Take 40 mg by mouth daily.   Yes [provider]  CALCIUM PO Take 1 tablet by mouth daily.   Yes [provider]  carvedilol (COREG) 6.25 MG tablet Take 6.25 mg by mouth 2 (two) times daily with a meal.   Yes [provider]  furosemide (LASIX) 40 MG tablet Take 20 mg by mouth daily.  12/16/15  Yes [provider]  losartan (COZAAR) 50 MG tablet Take 25 mg by mouth daily.   Yes [provider]  spironolactone (ALDACTONE) 25 MG tablet Take 25 mg by mouth daily. 12/16/15  Yes [provider]  acetaminophen (TYLENOL) 325 MG tablet Take 2 tablets (650 mg total) by mouth every 6 (six) hours as needed for mild pain or moderate pain. 02/06/17   [name], PA-C  doxycycline (VIBRAMYCIN) 100 MG capsule Take 1 capsule (100 mg total) by mouth 2 (two) times daily. 02/06/17   [name], PA-C    Family History Family History  Problem Relation Age of Onset  . Hypertension Mother   . Heart disease Father   . Heart disease Brother     Social History Social History  Substance Use Topics  . Smoking status: Current Every Day Smoker     Packs/day: 0.25    Types: Cigarettes  . Smokeless tobacco: Never Used  . Alcohol use No     Comment: denies use 03/31/2016      Allergies   Patient has no known allergies.   Review of Systems Review of Systems  Constitutional: Negative for chills and fever.  HENT: Negative for congestion and sore throat.   Eyes: Negative for visual disturbance.  Respiratory: Positive for shortness of breath. Negative for cough and wheezing.   Cardiovascular: Positive for chest pain. Negative for palpitations and leg swelling.  Gastrointestinal: Negative for abdominal pain, diarrhea, nausea and vomiting.  Genitourinary: Negative for dysuria.  Musculoskeletal: Negative for back pain and neck pain.  Skin: Negative for rash.  Neurological: Negative for dizziness, syncope, light-headedness, numbness and headaches.     Physical Exam Updated Vital Signs BP 129/88   Pulse 81   Temp 98.6 F (37 C) (Oral)   Resp 16   LMP 05/08/2013   SpO2 97%   Physical Exam  Constitutional: She is oriented to person, place, and time. She appears well-developed and well-nourished. No distress.  Nontoxic appearing.  HENT:  Head: Normocephalic and atraumatic.  Mouth/Throat: Oropharynx is clear and moist.  Eyes: Pupils are equal, round, and reactive to light. Conjunctivae are normal. Right eye exhibits no discharge. Left eye exhibits no discharge.  Neck: Neck supple. No JVD present. No tracheal deviation present.  Cardiovascular: Normal rate, regular rhythm, normal heart sounds and intact distal pulses.  Exam reveals no gallop and no friction rub.   No murmur heard. Bilateral radial, posterior tibialis and dorsalis pedis pulses are intact.    Pulmonary/Chest: Effort normal. No stridor. No respiratory distress. She has no wheezes.  Slight crackles noted bilaterally. No increased work of breathing. Symmetric chest expansion bilaterally.  Abdominal: Soft. There is no tenderness. There is no guarding.    Musculoskeletal: She exhibits no tenderness.  Slight bilateral ankle edema without calf edema or tenderness bilaterally.  Lymphadenopathy:    She has no cervical adenopathy.  Neurological: She is alert and oriented to person, place, and time. No sensory deficit. She exhibits normal muscle tone. Coordination normal.  Skin: Skin is warm and dry. Capillary refill takes less than 2 seconds. No rash noted. She is not diaphoretic. No erythema. No pallor.  Psychiatric: She has a normal mood and affect. Her behavior is normal.  Nursing note and vitals reviewed.    ED Treatments / Results  Labs (all labs ordered are listed, but only abnormal results are displayed) Labs Reviewed  COMPREHENSIVE METABOLIC PANEL - Abnormal; Notable for the following:       Result Value   Glucose, Bld  121 (*)    All other components within normal limits  BRAIN NATRIURETIC PEPTIDE  CBC WITH DIFFERENTIAL/PLATELET  D-DIMER, QUANTITATIVE (NOT AT [facility])  I-STAT TROPONIN, ED    EKG  EKG Interpretation None       Radiology Dg Chest 2 View  Result Date: 02/06/2017 CLINICAL DATA:  Right chest pain and shortness of breath for 2 days. EXAM: CHEST  2 VIEW COMPARISON:  12/21/2016 FINDINGS: There are increased interstitial densities at both lung bases. The upper lungs are clear. Heart size is normal. The trachea is midline. No acute bone abnormality. No large pleural effusions. IMPRESSION: Increased interstitial densities in both lower lungs. Differential diagnosis includes dependent edema versus atypical infection. Electronically Signed   By: Markus Daft M.D.   On: 02/06/2017 15:18    Procedures Procedures (including critical care time)  Medications Ordered in ED Medications  azithromycin (ZITHROMAX) tablet 500 mg (500 mg Oral Given 02/06/17 1852)  morphine 4 MG/ML injection 4 mg (4 mg Intravenous Given 02/06/17 1852)     Initial Impression / Assessment and Plan / ED Course  I have reviewed the triage vital signs  and the nursing notes.  Pertinent labs & imaging results that were available during my care of the patient were reviewed by me and considered in my medical decision making (see chart for details).    This  is a 51 y.o. Female who presents the emergency department complaining of chest pain ongoing since yesterday. Patient reports right-sided chest pain is worse with deep inspiration. She reports this feels similar to when she was diagnosed with congestive heart failure. She does report feeling short of breath over the past month. She sees a cardiologist in [facility] and reports she is fairly compliant with her medications. She reports she did miss her medications 2 days ago. She reports feeling more short of breath when she walks up stairs or exerts herself. She reports pain with deep inspiration of her right side of her chest. She does not report much leg swelling.  On exam the patient is afebrile and non-toxic appearing. No tachypnea, hypoxia or tachycardia on exam. Just some slight crackles noted bilaterally on lung exam. She is afebrile. No lower extremity edema or tenderness. Troponin is not elevated. CMP and CBC are unremarkable. No leukocytosis. BNP is not elevated at 16.9. D-dimer is negative at 0.31. Chest x-ray shows interstitial densities in both lower lungs which could be from dependent edema versus an atypical infection. Patient's BNP is not elevated. She clinically does not appear to be fluid overloaded. Suspect is from an atypical pneumonia that is causing her chest pain. She received her versus of antibiotics and some pain medicine in the emergency department. Reevaluation she is feeling much better. She ambulates without hypoxia. We'll discharge with a course of doxycycline due to her QTC of 483 and to cover atypical pneumonias. We'll have her follow closely with primary care and cardiology. Return precautions discussed. I advised the patient to follow-up with their primary care provider this  week. I advised the patient to return to the emergency department with new or worsening symptoms or new concerns. The patient verbalized understanding and agreement with plan.   This patient was discussed with Dr. Tomi Bamberger who agrees with assessment and plan.   Final Clinical Impressions(s) / ED Diagnoses   Final diagnoses:  Community acquired pneumonia of right lower lobe of lung (HCC)  Precordial pain    New Prescriptions New Prescriptions   ACETAMINOPHEN (TYLENOL) 325 MG TABLET  Take 2 tablets (650 mg total) by mouth every 6 (six) hours as needed for mild pain or moderate pain.   DOXYCYCLINE (VIBRAMYCIN) 100 MG CAPSULE    Take 1 capsule (100 mg total) by mouth 2 (two) times daily.     [name], PA-C 02/06/17 [place] Bailiff, MD 02/07/17 [phone]

## 2017-02-17 ENCOUNTER — Encounter (HOSPITAL_COMMUNITY): Payer: Self-pay | Admitting: Nurse Practitioner

## 2017-02-17 ENCOUNTER — Emergency Department (HOSPITAL_COMMUNITY)
Admission: EM | Admit: 2017-02-17 | Discharge: 2017-02-17 | Disposition: A | Discharge status: Home / Self Care | Payer: Self-pay | Attending: Emergency Medicine | Admitting: Emergency Medicine

## 2017-02-17 DIAGNOSIS — Z5321 Procedure and treatment not carried out due to patient leaving prior to being seen by health care provider: Secondary | ICD-10-CM | POA: Insufficient documentation

## 2017-02-17 LAB — BASIC METABOLIC PANEL
Anion gap: 6 (ref 5–15)
BUN: 13 mg/dL (ref 6–20)
CHLORIDE: 106 mmol/L (ref 101–111)
CO2: 27 mmol/L (ref 22–32)
CREATININE: 0.76 mg/dL (ref 0.44–1.00)
Calcium: 9.4 mg/dL (ref 8.9–10.3)
GFR calc Af Amer: >60 mL/min (ref >60)
GFR calc non Af Amer: >60 mL/min (ref >60)
Glucose, Bld: 94 mg/dL (ref 65–99)
Potassium: 4.1 mmol/L (ref 3.5–5.1)
Sodium: 139 mmol/L (ref 135–145)

## 2017-02-17 LAB — CBC
HCT: 39.7 % (ref 36.0–46.0)
Hemoglobin: 12.6 g/dL (ref 12.0–15.0)
MCH: 27.2 pg (ref 26.0–34.0)
MCHC: 31.7 g/dL (ref 30.0–36.0)
MCV: 85.6 fL (ref 78.0–100.0)
PLATELETS: 246 10*3/uL (ref 150–400)
RBC: 4.64 MIL/uL (ref 3.87–5.11)
RDW: 14.1 % (ref 11.5–15.5)
WBC: 5.9 10*3/uL (ref 4.0–10.5)

## 2017-02-17 LAB — I-STAT TROPONIN, ED: Troponin i, poc: 0 ng/mL (ref 0.00–0.08)

## 2017-02-17 MED ORDER — IBUPROFEN 400 MG PO TABS
400.0000 mg | ORAL_TABLET | Freq: Once | ORAL | Status: AC | PRN
  Administered 2017-02-17: 400 mg via ORAL

## 2017-02-17 MED ORDER — IBUPROFEN 400 MG PO TABS
ORAL_TABLET | ORAL | Status: AC
  Filled 2017-02-17: qty 1

## 2017-02-17 NOTE — ED Notes (Signed)
Pt called 3x for XR. Pt did not respond

## 2017-02-17 NOTE — ED Notes (Signed)
Called for patient multiple times with no response in waiting room, restroom and right outside ER

## 2017-02-17 NOTE — ED Triage Notes (Signed)
Pt reports being seen 2 weeks ago for cp and shob- pt was rx abx and has been compliant with  No relief of symptoms. Pt sts cp and shob are at same or worse then it was before. Pt able to speak full sentences, clutching right breast- sts sharp right breast pain comes intermittently. Lungs clear

## 2017-03-18 ENCOUNTER — Emergency Department (HOSPITAL_COMMUNITY): Payer: BLUE CROSS/BLUE SHIELD

## 2017-03-18 ENCOUNTER — Encounter (HOSPITAL_COMMUNITY): Payer: Self-pay | Admitting: Nurse Practitioner

## 2017-03-18 ENCOUNTER — Emergency Department (HOSPITAL_COMMUNITY)
Admission: EM | Admit: 2017-03-18 | Discharge: 2017-03-19 | Disposition: A | Discharge status: Home / Self Care | Payer: BLUE CROSS/BLUE SHIELD | Attending: Emergency Medicine | Admitting: Emergency Medicine

## 2017-03-18 DIAGNOSIS — Z79899 Other long term (current) drug therapy: Secondary | ICD-10-CM | POA: Insufficient documentation

## 2017-03-18 DIAGNOSIS — F17210 Nicotine dependence, cigarettes, uncomplicated: Secondary | ICD-10-CM | POA: Diagnosis not present

## 2017-03-18 DIAGNOSIS — I110 Hypertensive heart disease with heart failure: Secondary | ICD-10-CM | POA: Insufficient documentation

## 2017-03-18 DIAGNOSIS — I509 Heart failure, unspecified: Secondary | ICD-10-CM | POA: Diagnosis not present

## 2017-03-18 DIAGNOSIS — R1012 Left upper quadrant pain: Secondary | ICD-10-CM | POA: Insufficient documentation

## 2017-03-18 DIAGNOSIS — Z7982 Long term (current) use of aspirin: Secondary | ICD-10-CM | POA: Insufficient documentation

## 2017-03-18 LAB — URINALYSIS, ROUTINE W REFLEX MICROSCOPIC
Bilirubin Urine: NEGATIVE
GLUCOSE, UA: NEGATIVE mg/dL
Hgb urine dipstick: NEGATIVE
KETONES UR: NEGATIVE mg/dL
LEUKOCYTES UA: NEGATIVE
NITRITE: NEGATIVE
PH: 5 (ref 5.0–8.0)
Protein, ur: NEGATIVE mg/dL
SPECIFIC GRAVITY, URINE: 1.029 (ref 1.005–1.030)

## 2017-03-18 LAB — CBC WITH DIFFERENTIAL/PLATELET
BASOS PCT: 0 %
Basophils Absolute: 0 10*3/uL (ref 0.0–0.1)
Eosinophils Absolute: 0.2 10*3/uL (ref 0.0–0.7)
Eosinophils Relative: 3 %
HEMATOCRIT: 41.2 % (ref 36.0–46.0)
HEMOGLOBIN: 13.6 g/dL (ref 12.0–15.0)
LYMPHS ABS: 2.4 10*3/uL (ref 0.7–4.0)
Lymphocytes Relative: 38 %
MCH: 28.6 pg (ref 26.0–34.0)
MCHC: 33 g/dL (ref 30.0–36.0)
MCV: 86.7 fL (ref 78.0–100.0)
MONOS PCT: 9 %
Monocytes Absolute: 0.6 10*3/uL (ref 0.1–1.0)
NEUTROS ABS: 3.2 10*3/uL (ref 1.7–7.7)
NEUTROS PCT: 50 %
Platelets: 289 10*3/uL (ref 150–400)
RBC: 4.75 MIL/uL (ref 3.87–5.11)
RDW: 13.9 % (ref 11.5–15.5)
WBC: 6.3 10*3/uL (ref 4.0–10.5)

## 2017-03-18 LAB — COMPREHENSIVE METABOLIC PANEL
ALBUMIN: 4.5 g/dL (ref 3.5–5.0)
ALK PHOS: 86 U/L (ref 38–126)
ALT: 20 U/L (ref 14–54)
ANION GAP: 7 (ref 5–15)
AST: 18 U/L (ref 15–41)
BILIRUBIN TOTAL: 0.6 mg/dL (ref 0.3–1.2)
BUN: 19 mg/dL (ref 6–20)
CALCIUM: 9.5 mg/dL (ref 8.9–10.3)
CO2: 27 mmol/L (ref 22–32)
CREATININE: 0.77 mg/dL (ref 0.44–1.00)
Chloride: 107 mmol/L (ref 101–111)
GFR calc Af Amer: >60 mL/min (ref >60)
GFR calc non Af Amer: >60 mL/min (ref >60)
GLUCOSE: 113 mg/dL — AB (ref 65–99)
Potassium: 3.3 mmol/L — ABNORMAL LOW (ref 3.5–5.1)
SODIUM: 141 mmol/L (ref 135–145)
TOTAL PROTEIN: 8 g/dL (ref 6.5–8.1)

## 2017-03-18 LAB — PROTIME-INR
INR: 0.9
PROTHROMBIN TIME: 12.1 s (ref 11.4–15.2)

## 2017-03-18 LAB — POC URINE PREG, ED: PREG TEST UR: NEGATIVE

## 2017-03-18 LAB — I-STAT CG4 LACTIC ACID, ED: Lactic Acid, Venous: 1.06 mmol/L (ref 0.5–1.9)

## 2017-03-18 LAB — LIPASE, BLOOD: Lipase: 23 U/L (ref 11–51)

## 2017-03-18 MED ORDER — HYDROMORPHONE HCL 1 MG/ML IJ SOLN
1.0000 mg | Freq: Once | INTRAMUSCULAR | Status: AC
  Administered 2017-03-18: 1 mg via INTRAVENOUS
  Filled 2017-03-18: qty 1

## 2017-03-18 MED ORDER — ONDANSETRON HCL 4 MG/2ML IJ SOLN
4.0000 mg | Freq: Once | INTRAMUSCULAR | Status: AC
  Administered 2017-03-18: 4 mg via INTRAVENOUS
  Filled 2017-03-18: qty 2

## 2017-03-18 MED ORDER — SODIUM CHLORIDE 0.9 % IV BOLUS (SEPSIS)
1000.0000 mL | Freq: Once | INTRAVENOUS | Status: AC
  Administered 2017-03-18: 1000 mL via INTRAVENOUS

## 2017-03-18 MED ORDER — IOPAMIDOL (ISOVUE-300) INJECTION 61%
100.0000 mL | Freq: Once | INTRAVENOUS | Status: AC | PRN
  Administered 2017-03-18: 100 mL via INTRAVENOUS

## 2017-03-18 MED ORDER — IOPAMIDOL (ISOVUE-300) INJECTION 61%
INTRAVENOUS | Status: AC
  Filled 2017-03-18: qty 100

## 2017-03-18 MED ORDER — MORPHINE SULFATE (PF) 4 MG/ML IV SOLN
4.0000 mg | Freq: Once | INTRAVENOUS | Status: AC
  Administered 2017-03-18: 4 mg via INTRAVENOUS
  Filled 2017-03-18: qty 1

## 2017-03-18 NOTE — ED Triage Notes (Signed)
Pt is c/o left middle abdominal pain that she states she thinks has something to do with her hernia.

## 2017-03-18 NOTE — ED Provider Notes (Signed)
[facility] DEPT Provider Note   CSN: [id number] Arrival date & time: 03/18/17  1536     History   Chief Complaint Chief Complaint  Patient presents with  . Abdominal Pain    HPI Robin Pace is a 51 y.o. female.    The history is provided by the patient and medical records. No language interpreter was used.  Abdominal Pain   This is a recurrent problem. The current episode started more than 2 days ago (last few days acute worsening). The problem occurs constantly. The problem has been rapidly worsening. The pain is associated with a previous surgery. The pain is located in the LUQ and LLQ. The quality of the pain is aching. The pain is at a severity of 10/10. The pain is severe. Associated symptoms include fever, nausea and vomiting. Pertinent negatives include diarrhea, melena, constipation, dysuria, frequency, hematuria and headaches. The symptoms are aggravated by palpation and certain positions. Nothing relieves the symptoms. Past workup includes surgery (prior abdominal surgery).    Past Medical History:  Diagnosis Date  . Abdominal hernia   . Anemia   . CHF (congestive heart failure) ([place])   . Fibromyalgia   . Fibromyalgia   . Headache   . Hypertension   . Numbness on right side   . Wears dentures    top    Patient Active Problem List   Diagnosis Date Noted  . Neck pain on right side 09/10/2013  . Low back pain 09/10/2013  . Headache   . Numbness on right side     Past Surgical History:  Procedure Laterality Date  . CESAREAN SECTION    . EXCISION OF LARGE MASS right FLANK WITH LIPO ASSISTANCE Right 08/06/2012   Performed by Cristine Polio, MD at [facility]  . HERNIA REPAIR    . INCISE AND DRAIN ABCESS  2005   rt middle finger   . 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 N/A 08/12/2013   Performed by Cristine Polio, MD at [facility]  . TUBAL LIGATION      OB History      Gravida Para Term Preterm AB Living   2 2 1 1   2    SAB TAB Ectopic Multiple Live Births                   Home Medications    Prior to Admission medications   Medication Sig Start Date End Date Taking? Authorizing Provider  acetaminophen (TYLENOL) 325 MG tablet Take 2 tablets (650 mg total) by mouth every 6 (six) hours as needed for mild pain or moderate pain. 02/06/17   [name], PA-C  aspirin 81 MG chewable tablet Chew 81 mg by mouth daily. 11/26/15   [provider]  atorvastatin (LIPITOR) 40 MG tablet Take 40 mg by mouth daily.    [provider]  CALCIUM PO Take 1 tablet by mouth daily.    [provider]  carvedilol (COREG) 6.25 MG tablet Take 6.25 mg by mouth 2 (two) times daily with a meal.    [provider]  doxycycline (VIBRAMYCIN) 100 MG capsule Take 1 capsule (100 mg total) by mouth 2 (two) times daily. 02/06/17   [name], PA-C  furosemide (LASIX) 40 MG tablet Take 20 mg by mouth daily.  12/16/15   [provider]  losartan (COZAAR) 50 MG tablet Take 25 mg by mouth daily.    [provider]  spironolactone (  ALDACTONE) 25 MG tablet Take 25 mg by mouth daily. 12/16/15   [provider]    Family History Family History  Problem Relation Age of Onset  . Hypertension Mother   . Heart disease Father   . Heart disease Brother     Social History Social History   Tobacco Use  . Smoking status: Current Every Day Smoker    Packs/day: 0.25    Types: Cigarettes  . Smokeless tobacco: Never Used  Substance Use Topics  . Alcohol use: No    Comment: denies use 03/31/2016   . Drug use: Yes    Types: Marijuana    Comment: occ     Allergies   Patient has no known allergies.   Review of Systems Review of Systems  Constitutional: Positive for fever. Negative for chills and diaphoresis.  HENT: Negative for congestion and rhinorrhea.   Respiratory: Negative for cough, chest tightness, shortness  of breath, wheezing and stridor.   Cardiovascular: Negative for chest pain and palpitations.  Gastrointestinal: Positive for abdominal pain, nausea and vomiting. Negative for constipation, diarrhea and melena.  Genitourinary: Positive for flank pain. Negative for decreased urine volume, dysuria, frequency, hematuria, vaginal bleeding, vaginal discharge and vaginal pain.  Musculoskeletal: Negative for back pain, neck pain and neck stiffness.  Skin: Negative for rash and wound.  Neurological: Negative for light-headedness and headaches.  Psychiatric/Behavioral: Negative for agitation and confusion.  All other systems reviewed and are negative.    Physical Exam Updated Vital Signs BP 108/78 (BP Location: Left Arm)   Pulse 92   Temp 98.2 F (36.8 C) (Oral)   Resp 14   LMP 05/08/2013   SpO2 97%   Physical Exam  Constitutional: She is oriented to person, place, and time. She appears well-developed and well-nourished.  Non-toxic appearance. She does not appear ill. No distress.  HENT:  Head: Normocephalic.  Mouth/Throat: Oropharynx is clear and moist. No oropharyngeal exudate.  Eyes: Conjunctivae and EOM are normal. Pupils are equal, round, and reactive to light.  Neck: Normal range of motion.  Cardiovascular: Normal rate and intact distal pulses.  No murmur heard. Pulmonary/Chest: Effort normal. No respiratory distress. She has no wheezes. She has no rales. She exhibits no tenderness.  Abdominal: Soft. Normal appearance and bowel sounds are normal. There is tenderness in the left upper quadrant and left lower quadrant. There is no rigidity, no rebound and no CVA tenderness.    Musculoskeletal: She exhibits no tenderness.  Neurological: She is alert and oriented to person, place, and time. No sensory deficit. She exhibits normal muscle tone.  Skin: Capillary refill takes less than 2 seconds. No rash noted. She is not diaphoretic. No erythema.  Psychiatric: She has a normal mood and  affect.  Nursing note and vitals reviewed.    ED Treatments / Results  Labs (all labs ordered are listed, but only abnormal results are displayed) Labs Reviewed - No data to display  EKG  EKG Interpretation None       Radiology No results found.  Procedures Procedures (including critical care time)  Medications Ordered in ED Medications - No data to display   Initial Impression / Assessment and Plan / ED Course  I have reviewed the triage vital signs and the nursing notes.  Pertinent labs & imaging results that were available during my care of the patient were reviewed by me and considered in my medical decision making (see chart for details).     Robin Pace is a 51  y.o. female with a past medical history significant for hypertension, CHF, 5 myalgia, and abdominal hernia status post abdominal surgeries who presents with severe left-sided abdominal pain with nausea and vomiting.  Patient reports that she had a history of abdominal surgery after hernia and abdominal mass discovery.  She reports that her pain is similar but it is much worse than before her surgeries.  She says that the pain has been on and off for months but she reports that over the last several days it has been constant and severe.  She describes it as 10 out of 10 in severity and all on her left abdomen.  She reports nausea and vomiting but denies any changes in bowel movements.  She denies constipation, diarrhea and still passing gas.  She or vaginal symptoms.  She denies vaginal discharge or vaginal bleeding.  She denies any congestion, cough, or shortness of breath.  She does report subjective fevers and chills.  She describes the pain as aching and otherwise nonradiating.  On exam, patient has tenderness on her left abdomen.  No CVA tenderness.  Mild flank tenderness on the left.  Lungs clear chest nontender.  Due to patient's history of pain similar to the abdominal wall hernia/mass pain, patient will  have laboratory testing and imaging to further evaluate.  Patient given pain medicine, nausea medicine and fluids during initial workup.  Anticipate reassessment after workup.  Diagnostic testing results seen above.  Laboratory testing reassuring.  CT imaging revealed no acute abnormality seen patient does have multiple abdominal hernias that are fat-containing.  Given her similarity to prior hernia pain, I suspect this may be the cause of discomfort.  Patient will need to follow-up with her general surgeon for reassessment and further management.  Patient given prescription for pain medication.  Patient understood return precautions for any new or worsening symptoms.  Patient had no other questions or concerns and was discharged in good condition with improving symptoms.   Final Clinical Impressions(s) / ED Diagnoses   Final diagnoses:  Left upper quadrant pain    ED Discharge Orders        Ordered    oxyCODONE-acetaminophen (PERCOCET/ROXICET) 5-325 MG tablet  Every 4 hours PRN     03/19/17 0032    ondansetron (ZOFRAN) 4 MG tablet  Every 8 hours PRN     03/19/17 0032      Clinical Impression: 1. Left upper quadrant pain     Disposition: Discharge  Condition: Good  I have discussed the results, Dx and Tx plan with the pt(& family if present). He/she/they expressed understanding and agree(s) with the plan. Discharge instructions discussed at great length. Strict return precautions discussed and pt &/or family have verbalized understanding of the instructions. No further questions at time of discharge.    This SmartLink is deprecated. Use AVSMEDLIST instead to display the medication list for a patient.  Follow Up: [place] [id number] [phone] Schedule an appointment as soon as possible for a visit    [facility] [address] [phone] [place] [phone]  If symptoms worsen     Abbie Berling, Gwenyth Allegra, MD 03/19/17 [phone]

## 2017-03-18 NOTE — ED Notes (Signed)
Patient transported to CT 

## 2017-03-19 MED ORDER — ONDANSETRON HCL 4 MG PO TABS: 4.0000 mg | ORAL_TABLET | Freq: Three times a day (TID) | ORAL | 0 refills | Status: DC | PRN

## 2017-03-19 MED ORDER — OXYCODONE-ACETAMINOPHEN 5-325 MG PO TABS: 1.0000 | ORAL_TABLET | ORAL | 0 refills | Status: DC | PRN

## 2017-03-19 NOTE — Discharge Instructions (Signed)
Your workup showed no evidence of acute abnormality in your abdomen or pelvis aside from the known hernias.  I suspect these are the cause of your pain.  We did not find evidence of infections.  Please follow-up with your surgeon and primary care physician for further management.  If any symptoms change or worsen, please return to the nearest emergency department.

## 2017-03-20 LAB — URINE CULTURE: Culture: 50000 — AB

## 2017-03-21 ENCOUNTER — Telehealth: Payer: Self-pay | Admitting: Emergency Medicine

## 2017-03-21 NOTE — Telephone Encounter (Signed)
Post ED Visit - Positive Culture Follow-up  Culture report reviewed by antimicrobial stewardship pharmacist:  []  Elenor Quinones, Pharm.D. []  Heide Guile, Pharm.D., BCPS AQ-ID []  Parks Neptune, Pharm.D., BCPS []  Alycia Rossetti, Pharm.D., BCPS []  [place], [place].D., BCPS, AAHIVP []  Legrand Como, Pharm.D., BCPS, AAHIVP []  Salome Arnt, PharmD, BCPS []  Dimitri Ped, PharmD, BCPS []  Vincenza Hews, PharmD, BCPS  Positive urine culture Treated with none, asymptomatic,  no further patient follow-up is required at this time.  Hazle Nordmann 03/21/2017, 2:14 PM

## 2017-05-26 ENCOUNTER — Encounter (HOSPITAL_COMMUNITY): Payer: Self-pay | Admitting: Emergency Medicine

## 2017-05-26 ENCOUNTER — Emergency Department (HOSPITAL_COMMUNITY): Payer: Self-pay

## 2017-05-26 ENCOUNTER — Emergency Department (HOSPITAL_COMMUNITY)
Admission: EM | Admit: 2017-05-26 | Discharge: 2017-05-26 | Disposition: A | Discharge status: Home / Self Care | Payer: Self-pay | Attending: Emergency Medicine | Admitting: Emergency Medicine

## 2017-05-26 DIAGNOSIS — F17210 Nicotine dependence, cigarettes, uncomplicated: Secondary | ICD-10-CM | POA: Insufficient documentation

## 2017-05-26 DIAGNOSIS — I509 Heart failure, unspecified: Secondary | ICD-10-CM | POA: Insufficient documentation

## 2017-05-26 DIAGNOSIS — Y929 Unspecified place or not applicable: Secondary | ICD-10-CM | POA: Insufficient documentation

## 2017-05-26 DIAGNOSIS — Z79899 Other long term (current) drug therapy: Secondary | ICD-10-CM | POA: Insufficient documentation

## 2017-05-26 DIAGNOSIS — I110 Hypertensive heart disease with heart failure: Secondary | ICD-10-CM | POA: Insufficient documentation

## 2017-05-26 DIAGNOSIS — M25512 Pain in left shoulder: Secondary | ICD-10-CM | POA: Insufficient documentation

## 2017-05-26 DIAGNOSIS — Y999 Unspecified external cause status: Secondary | ICD-10-CM | POA: Insufficient documentation

## 2017-05-26 DIAGNOSIS — Y939 Activity, unspecified: Secondary | ICD-10-CM | POA: Insufficient documentation

## 2017-05-26 DIAGNOSIS — Z7982 Long term (current) use of aspirin: Secondary | ICD-10-CM | POA: Insufficient documentation

## 2017-05-26 MED ORDER — MELOXICAM 15 MG PO TABS: 15.0000 mg | ORAL_TABLET | Freq: Every day | ORAL | 0 refills | Status: DC

## 2017-05-26 MED ORDER — TRAMADOL HCL 50 MG PO TABS: 50.0000 mg | ORAL_TABLET | Freq: Four times a day (QID) | ORAL | 0 refills | Status: DC | PRN

## 2017-05-26 MED ORDER — KETOROLAC TROMETHAMINE 60 MG/2ML IM SOLN
60.0000 mg | Freq: Once | INTRAMUSCULAR | Status: AC
  Administered 2017-05-26: 60 mg via INTRAMUSCULAR
  Filled 2017-05-26: qty 2

## 2017-05-26 NOTE — Discharge Instructions (Signed)
Your x-ray was negative. You will need to follow up with the orthopedic doctor. Contact a health care provider if: Your pain gets worse. Your pain is not relieved with medicines. New pain develops in your arm, hand, or fingers. Get help right away if: Your arm, hand, or fingers: Tingle. Become numb. Become swollen. Become painful. Turn white or blue

## 2017-05-26 NOTE — ED Triage Notes (Signed)
Called to treatment room. No response to call
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
## 2017-05-26 NOTE — ED Triage Notes (Signed)
Pt located and escorted to treatment room
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
## 2017-07-06 ENCOUNTER — Encounter (HOSPITAL_COMMUNITY): Payer: Self-pay | Admitting: Emergency Medicine

## 2017-07-06 ENCOUNTER — Emergency Department (HOSPITAL_COMMUNITY): Payer: Self-pay

## 2017-07-06 ENCOUNTER — Inpatient Hospital Stay (HOSPITAL_COMMUNITY)
Admission: EM | Admit: 2017-07-06 | Discharge: 2017-07-10 | DRG: 638 | Disposition: A | Discharge status: Home / Self Care | Payer: Self-pay | Attending: Internal Medicine | Admitting: Internal Medicine

## 2017-07-06 DIAGNOSIS — N179 Acute kidney failure, unspecified: Secondary | ICD-10-CM | POA: Diagnosis present

## 2017-07-06 DIAGNOSIS — I10 Essential (primary) hypertension: Secondary | ICD-10-CM

## 2017-07-06 DIAGNOSIS — R000 Tachycardia, unspecified: Secondary | ICD-10-CM | POA: Diagnosis present

## 2017-07-06 DIAGNOSIS — R739 Hyperglycemia, unspecified: Secondary | ICD-10-CM | POA: Diagnosis present

## 2017-07-06 DIAGNOSIS — E1165 Type 2 diabetes mellitus with hyperglycemia: Principal | ICD-10-CM | POA: Diagnosis present

## 2017-07-06 DIAGNOSIS — E119 Type 2 diabetes mellitus without complications: Secondary | ICD-10-CM

## 2017-07-06 DIAGNOSIS — E785 Hyperlipidemia, unspecified: Secondary | ICD-10-CM | POA: Diagnosis present

## 2017-07-06 DIAGNOSIS — E860 Dehydration: Secondary | ICD-10-CM

## 2017-07-06 DIAGNOSIS — Z7982 Long term (current) use of aspirin: Secondary | ICD-10-CM

## 2017-07-06 DIAGNOSIS — I428 Other cardiomyopathies: Secondary | ICD-10-CM | POA: Diagnosis present

## 2017-07-06 DIAGNOSIS — J029 Acute pharyngitis, unspecified: Secondary | ICD-10-CM | POA: Diagnosis present

## 2017-07-06 DIAGNOSIS — I110 Hypertensive heart disease with heart failure: Secondary | ICD-10-CM | POA: Diagnosis present

## 2017-07-06 DIAGNOSIS — I5022 Chronic systolic (congestive) heart failure: Secondary | ICD-10-CM | POA: Diagnosis present

## 2017-07-06 DIAGNOSIS — M797 Fibromyalgia: Secondary | ICD-10-CM | POA: Diagnosis present

## 2017-07-06 DIAGNOSIS — Z79899 Other long term (current) drug therapy: Secondary | ICD-10-CM

## 2017-07-06 DIAGNOSIS — F17210 Nicotine dependence, cigarettes, uncomplicated: Secondary | ICD-10-CM | POA: Diagnosis present

## 2017-07-06 LAB — I-STAT BETA HCG BLOOD, ED (MC, WL, AP ONLY)

## 2017-07-06 LAB — URINALYSIS, ROUTINE W REFLEX MICROSCOPIC
Bilirubin Urine: NEGATIVE
Glucose, UA: >=500 mg/dL — AB
Ketones, ur: 20 mg/dL — AB
Leukocytes, UA: NEGATIVE
Nitrite: NEGATIVE
PH: 5 (ref 5.0–8.0)
Protein, ur: NEGATIVE mg/dL
SPECIFIC GRAVITY, URINE: 1.029 (ref 1.005–1.030)

## 2017-07-06 LAB — CBG MONITORING, ED
GLUCOSE-CAPILLARY: 491 mg/dL — AB (ref 65–99)
GLUCOSE-CAPILLARY: 352 mg/dL — AB (ref 65–99)

## 2017-07-06 LAB — CBC
HCT: 41.6 % (ref 36.0–46.0)
HEMOGLOBIN: 14.2 g/dL (ref 12.0–15.0)
MCH: 28.3 pg (ref 26.0–34.0)
MCHC: 34.1 g/dL (ref 30.0–36.0)
MCV: 83 fL (ref 78.0–100.0)
Platelets: 238 10*3/uL (ref 150–400)
RBC: 5.01 MIL/uL (ref 3.87–5.11)
RDW: 13 % (ref 11.5–15.5)
WBC: 7.1 10*3/uL (ref 4.0–10.5)

## 2017-07-06 LAB — I-STAT TROPONIN, ED
TROPONIN I, POC: 0 ng/mL (ref 0.00–0.08)
TROPONIN I, POC: 0 ng/mL (ref 0.00–0.08)

## 2017-07-06 LAB — BASIC METABOLIC PANEL
ANION GAP: 14 (ref 5–15)
BUN: 21 mg/dL — AB (ref 6–20)
CALCIUM: 9.6 mg/dL (ref 8.9–10.3)
CO2: 24 mmol/L (ref 22–32)
Chloride: 92 mmol/L — ABNORMAL LOW (ref 101–111)
Creatinine, Ser: 1.22 mg/dL — ABNORMAL HIGH (ref 0.44–1.00)
GFR calc Af Amer: 59 mL/min — ABNORMAL LOW (ref >60)
GFR, EST NON AFRICAN AMERICAN: 51 mL/min — AB (ref >60)
GLUCOSE: 651 mg/dL — AB (ref 65–99)
POTASSIUM: 4.3 mmol/L (ref 3.5–5.1)
Sodium: 130 mmol/L — ABNORMAL LOW (ref 135–145)

## 2017-07-06 MED ORDER — CARVEDILOL 3.125 MG PO TABS
3.1250 mg | ORAL_TABLET | Freq: Once | ORAL | Status: DC
  Filled 2017-07-06: qty 1

## 2017-07-06 MED ORDER — SODIUM CHLORIDE 0.9 % IV BOLUS (SEPSIS)
1000.0000 mL | Freq: Once | INTRAVENOUS | Status: AC
  Administered 2017-07-06: 1000 mL via INTRAVENOUS

## 2017-07-06 MED ORDER — INSULIN ASPART 100 UNIT/ML ~~LOC~~ SOLN
6.0000 [IU] | Freq: Once | SUBCUTANEOUS | Status: AC
  Administered 2017-07-06: 6 [IU] via INTRAVENOUS
  Filled 2017-07-06: qty 1

## 2017-07-06 MED ORDER — SODIUM CHLORIDE 0.9 % IV SOLN
Freq: Once | INTRAVENOUS | Status: AC
  Administered 2017-07-06: 22:00:00 via INTRAVENOUS

## 2017-07-06 MED ORDER — CARVEDILOL 3.125 MG PO TABS: 3.1250 mg | ORAL_TABLET | Freq: Two times a day (BID) | ORAL | Status: DC

## 2017-07-06 MED ORDER — SODIUM CHLORIDE 0.9 % IV BOLUS (SEPSIS): 500.0000 mL | Freq: Once | INTRAVENOUS | Status: DC

## 2017-07-06 NOTE — ED Notes (Signed)
Boluses finishing up, will recheck CBG once fluids are complete.

## 2017-07-06 NOTE — H&P (Signed)
[facility] H&P   Patient Demographics:    Robin Pace, is a 52 y.o. female  MRN: [id number]   DOB - January 19, 1966  Admit Date - 07/06/2017  Outpatient Primary MD for the patient is Patient, No Pcp Per  Referring MD/NP/PA:  Lindell Noe  Outpatient Specialists:   Patient coming from: home  Chief Complaint  Patient presents with  . Medication Reaction  . Chest Pain      HPI:    Robin Pace  is a 52 y.o. female, w hypertension, CHF ( EF 30% ), nonischemic cardiomyopathy, (cardiac cath 12/15/2015=> nl coronaries, Rozann Lesches ) w temp defibrillator for 73months.   C/o polyuria x2 days and dry mouth and blurred vision,   Pt presented due to thought that she was having allerghic reaction to her medication.  In ED,    CXR IMPRESSION: No active cardiopulmonary disease.  Na 130, K 4.3,  Glucose 651 Bun 21, Creatinine 1.22 Wbc 7.1, Hgb 14.2, Plt 238 Urinalysis ketone 20, glucose >500 Trop 0.000  Pt will be admitted for new onset diabetes and hyperglycemia      Review of systems:    In addition to the HPI above,  No Fever-chills, No Headache, No changes with  hearing, No problems swallowing food or Liquids, No Chest pain, Cough or Shortness of Breath, No Abdominal pain, No Nausea or Vommitting, Bowel movements are regular, No Blood in stool or Urine, No dysuria, No new skin rashes or bruises, No new joints pains-aches,  No new weakness, tingling, numbness in any extremity, No recent weight gain or loss, No  polyphagia, No significant Mental Stressors.  A full 10 point Review of Systems was done, except as stated above, all other Review of Systems were negative.   With Past History of the following :    Past Medical History:  Diagnosis Date  . Abdominal hernia   . Anemia   . CHF (congestive heart failure) ([place])   . Fibromyalgia   . Fibromyalgia   . Headache     . Hypertension   . Numbness on right side   . Wears dentures    top      Past Surgical History:  Procedure Laterality Date  . ABDOMINOPLASTY N/A 08/12/2013   Procedure: 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;  Surgeon: Cristine Polio, MD;  Location: [facility];  Service: Plastics;  Laterality: N/A;  . CESAREAN SECTION    . HERNIA REPAIR    . INCISE AND DRAIN ABCESS  2005   rt middle finger   . MASS EXCISION Right 08/06/2012   Procedure: EXCISION OF LARGE MASS right FLANK WITH LIPO ASSISTANCE;  Surgeon: Cristine Polio, MD;  Location: [facility];  Service: Plastics;  Laterality: Right;  . TUBAL LIGATION        Social History:     Social History   Tobacco  Use  . Smoking status: Current Every Day Smoker    Packs/day: 0.25    Years: 30.00    Pack years: 7.50    Types: Cigarettes  . Smokeless tobacco: Never Used  Substance Use Topics  . Alcohol use: Yes     Lives - at home  Mobility - walks by self   Family History :     Family History  Problem Relation Age of Onset  . Hypertension Mother   . Diabetes Mother   . Heart disease Father   . Heart disease Brother     Home Medications:   Prior to Admission medications   Medication Sig Start Date End Date Taking? Authorizing Provider  aspirin 81 MG chewable tablet Chew 81 mg by mouth daily. 11/26/15  Yes [provider]  atorvastatin (LIPITOR) 40 MG tablet Take 40 mg by mouth daily.   Yes [provider]  CALCIUM PO Take 1 tablet by mouth daily.   Yes [provider]  acetaminophen (TYLENOL) 325 MG tablet Take 2 tablets (650 mg total) by mouth every 6 (six) hours as needed for mild pain or moderate pain. Patient not taking: Reported on 07/06/2017 02/06/17   [name], PA-C  carvedilol (COREG) 6.25 MG tablet Take 6.25 mg by mouth 2 (two) times daily with a meal.    [provider]  doxycycline (VIBRAMYCIN) 100 MG capsule Take 1  capsule (100 mg total) by mouth 2 (two) times daily. Patient not taking: Reported on 07/06/2017 02/06/17   [name], PA-C  furosemide (LASIX) 40 MG tablet Take 20 mg by mouth daily.  12/16/15   [provider]  losartan (COZAAR) 50 MG tablet Take 25 mg by mouth daily.    [provider]  meloxicam (MOBIC) 15 MG tablet Take 1 tablet (15 mg total) by mouth daily. 05/26/17   Harris, Abigail, PA-C  ondansetron (ZOFRAN) 4 MG tablet Take 1 tablet (4 mg total) every 8 (eight) hours as needed by mouth for nausea or vomiting. 03/19/17   Tegeler, Gwenyth Allegra, MD  oxyCODONE-acetaminophen (PERCOCET/ROXICET) 5-325 MG tablet Take 1 tablet every 4 (four) hours as needed by mouth for severe pain. 03/19/17   Tegeler, Gwenyth Allegra, MD  spironolactone (ALDACTONE) 25 MG tablet Take 25 mg by mouth daily. 12/16/15   [provider]  traMADol (ULTRAM) 50 MG tablet Take 1 tablet (50 mg total) by mouth every 6 (six) hours as needed. 05/26/17   Margarita Mail, PA-C     Allergies:    No Known Allergies   Physical Exam:   Vitals  Blood pressure 116/73, pulse 95, temperature 97.7 F (36.5 C), temperature source Oral, resp. rate 16, height 5\' 8"  (1.727 m), weight 87.5 kg (193 lb), last menstrual period 05/08/2013, SpO2 98 %.   1. General  lying in bed in NAD,    2. Normal affect and insight, Not Suicidal or Homicidal, Awake Alert, Oriented X 3.  3. No F.N deficits, ALL C.Nerves Intact, Strength 5/5 all 4 extremities, Sensation intact all 4 extremities, Plantars down going.  4. Ears and Eyes appear Normal, Conjunctivae clear, PERRLA. Moist Oral Mucosa.  5. Supple Neck, No JVD, No cervical lymphadenopathy appriciated, No Carotid Bruits.  6. Symmetrical Chest wall movement, Good air movement bilaterally, CTAB.  7. RRR, No Gallops, Rubs or Murmurs, No Parasternal Heave.  8. Positive Bowel Sounds, Abdomen Soft, No tenderness, No organomegaly appriciated,No rebound -guarding or  rigidity.  9.  No Cyanosis, Normal Skin Turgor, No Skin Rash  or Bruise.  10. Good muscle tone,  joints appear normal , no effusions, Normal ROM.  11. No Palpable Lymph Nodes in Neck or Axillae    Data Review:    CBC Recent Labs  Lab 07/06/17 1641  WBC 7.1  HGB 14.2  HCT 41.6  PLT 238  MCV 83.0  MCH 28.3  MCHC 34.1  RDW 13.0   ------------------------------------------------------------------------------------------------------------------  Chemistries  Recent Labs  Lab 07/06/17 1641  NA 130*  K 4.3  CL 92*  CO2 24  GLUCOSE 651*  BUN 21*  CREATININE 1.22*  CALCIUM 9.6   ------------------------------------------------------------------------------------------------------------------ estimated creatinine clearance is 63.8 mL/min (A) (by C-G formula based on SCr of 1.22 mg/dL (H)). ------------------------------------------------------------------------------------------------------------------ No results for input(s): TSH, T4TOTAL, T3FREE, THYROIDAB in the last 72 hours.  Invalid input(s): FREET3  Coagulation profile No results for input(s): INR, PROTIME in the last 168 hours. ------------------------------------------------------------------------------------------------------------------- No results for input(s): DDIMER in the last 72 hours. -------------------------------------------------------------------------------------------------------------------  Cardiac Enzymes No results for input(s): CKMB, TROPONINI, MYOGLOBIN in the last 168 hours.  Invalid input(s): CK ------------------------------------------------------------------------------------------------------------------    Component Value Date/Time   BNP 16.9 02/06/2017 1525     ---------------------------------------------------------------------------------------------------------------  Urinalysis    Component Value Date/Time   COLORURINE STRAW (A) 07/06/2017 1956   APPEARANCEUR CLEAR  07/06/2017 1956   LABSPEC 1.029 07/06/2017 1956   PHURINE 5.0 07/06/2017 1956   GLUCOSEU >=500 (A) 07/06/2017 1956   HGBUR SMALL (A) 07/06/2017 1956   BILIRUBINUR NEGATIVE 07/06/2017 1956   KETONESUR 20 (A) 07/06/2017 1956   PROTEINUR NEGATIVE 07/06/2017 1956   UROBILINOGEN 0.2 09/12/2013 1210   NITRITE NEGATIVE 07/06/2017 1956   LEUKOCYTESUR NEGATIVE 07/06/2017 1956    ----------------------------------------------------------------------------------------------------------------   Imaging Results:    Dg Chest 2 View  Result Date: 07/06/2017 CLINICAL DATA:  Frequent urination, dry mouth, some dizziness, sharp chest pain, smoking history EXAM: CHEST - 2 VIEW COMPARISON:  Chest x-ray of 02/04/2017 FINDINGS: No active infiltrate or effusion is seen. Mild biapical pleural thickening is present. Mediastinal and hilar contours are unremarkable. The heart is within normal limits in size. No acute bony abnormality is seen. IMPRESSION: No active cardiopulmonary disease. Electronically Signed   By: Ivar Drape M.D.   On: 07/06/2017 17:05      Assessment & Plan:    Principal Problem:   Hyperglycemia Active Problems:   Tachycardia   Diabetes mellitus without complication (HCC)   Hypertension    Hyperglycemia, new onset DM2 Ns iv gently due to hx of CHF fsbs q4h , ISS Check hga1c, check lipid Start Amaryl 2mg  po qday  Tachycardia Tele Trop I q6h x3 Consider cardiac echo if not improving   Hypertension, CHF (EF 30%) Cont Aspirin 81mg  po qday Cont Carvedilol 6.25mg  po bid Cont losartan 50mg  p[o qday Cont Spironolactone 25mg  po qday Cont Lasix 20mg  po qday  Hyperlipidemia Cont Lipitor 40mg  po qhs    DVT Prophylaxis  Lovenox - SCDs  AM Labs Ordered, also please review Full Orders  Family Communication: Admission, patients condition and plan of care including tests being ordered have been discussed with the patient who indicate understanding and agree with the plan and Code  Status.  Code Status FULL CODE  Likely DC to  home  Condition GUARDED    Consults called: none  Admission status: observation   Time spent in minutes : 45   Jani Gravel M.D on 07/06/2017 at 10:52 PM  Between 7am to 7pm - Pager - [phone]  . After 7pm go  to [email or web address] - password [facility]  [facility] - Office  [phone]

## 2017-07-06 NOTE — ED Provider Notes (Signed)
[facility] 5W PROGRESSIVE CARE Provider Note   CSN: [id number] Arrival date & time: 07/06/17  1613     History   Chief Complaint Chief Complaint  Patient presents with  . Medication Reaction  . Chest Pain    HPI Robin Pace is a 52 y.o. female.  HPI   52 year old female with history of CHF, hypertension, here with general fatigue.  The patient states that for the last 3 days, she has had progressively worsening polyuria, polydipsia, and fatigue.  She is noticed that this has been also happening intermittently for the last several weeks.  She thought it was due to running of her blood pressure medications while she was recently visited [place], and she was started on her home meds in [place].  She felt like it was related to those medications, given that her symptoms worsen when she started taking them, though she states that these were the same medications that she takes in [place].  They were called in by the doctor.  She states that since then, she had generalized weakness.  She said increased appetite.  She endorses significant dry throat.  No known history of diabetes.  Denies any other medical complaints.  No other recent medication changes.  No fever, chills, cough, or other recent illness.  Past Medical History:  Diagnosis Date  . Abdominal hernia   . Anemia   . CHF (congestive heart failure) ([facility])   . Fibromyalgia   . Fibromyalgia   . Headache   . Hypertension   . Numbness on right side   . Wears dentures    top    Patient Active Problem List   Diagnosis Date Noted  . Hyperglycemia 07/06/2017  . Tachycardia 07/06/2017  . Diabetes mellitus without complication ([facility]) 40/98/1191  . Hypertension 07/06/2017  . Neck pain on right side 09/10/2013  . Low back pain 09/10/2013  . Headache   . Numbness on right side     Past Surgical History:  Procedure Laterality Date  . 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 CESAREAN SECTION    . HERNIA REPAIR    . INCISE AND DRAIN ABCESS  2005   rt middle finger   . MASS EXCISION Right 08/06/2012   Procedure: EXCISION OF LARGE MASS right FLANK WITH LIPO ASSISTANCE;  Surgeon: Cristine Polio, MD;  Location: [facility];  Service: Plastics;  Laterality: Right;  . TUBAL LIGATION      OB History    Gravida Para Term Preterm AB Living   2 2 1 1   2    SAB TAB Ectopic Multiple Live Births                   Home Medications    Prior to Admission medications   Medication Sig Start Date End Date Taking? Authorizing Provider  aspirin 81 MG chewable tablet Chew 81 mg by mouth daily. 11/26/15  Yes [provider]  atorvastatin (LIPITOR) 40 MG tablet Take 40 mg by mouth daily.   Yes [provider]  CALCIUM PO Take 1 tablet by mouth daily.   Yes [provider]  acetaminophen (TYLENOL) 325 MG tablet Take 2 tablets (650 mg total) by mouth every 6 (six) hours as needed for mild pain or moderate pain. Patient not taking: Reported on 07/06/2017 02/06/17  [name], PA-C  carvedilol (COREG) 6.25 MG tablet Take 6.25 mg by mouth 2 (two) times daily with a meal.    [provider]  doxycycline (VIBRAMYCIN) 100 MG capsule Take 1 capsule (100 mg total) by mouth 2 (two) times daily. Patient not taking: Reported on 07/06/2017 02/06/17   [name], PA-C  furosemide (LASIX) 40 MG tablet Take 20 mg by mouth daily.  12/16/15   [provider]  losartan (COZAAR) 50 MG tablet Take 25 mg by mouth daily.    [provider]  meloxicam (MOBIC) 15 MG tablet Take 1 tablet (15 mg total) by mouth daily. 05/26/17   Harris, Abigail, PA-C  ondansetron (ZOFRAN) 4 MG tablet Take 1 tablet (4 mg total) every 8 (eight) hours as needed by mouth for nausea or vomiting. 03/19/17   Tegeler, Gwenyth Allegra,  MD  oxyCODONE-acetaminophen (PERCOCET/ROXICET) 5-325 MG tablet Take 1 tablet every 4 (four) hours as needed by mouth for severe pain. 03/19/17   Tegeler, Gwenyth Allegra, MD  spironolactone (ALDACTONE) 25 MG tablet Take 25 mg by mouth daily. 12/16/15   [provider]  traMADol (ULTRAM) 50 MG tablet Take 1 tablet (50 mg total) by mouth every 6 (six) hours as needed. 05/26/17   Margarita Mail, PA-C    Family History Family History  Problem Relation Age of Onset  . Hypertension Mother   . Diabetes Mother   . Heart disease Father   . Heart disease Brother     Social History Social History   Tobacco Use  . Smoking status: Current Every Day Smoker    Packs/day: 0.25    Years: 30.00    Pack years: 7.50    Types: Cigarettes  . Smokeless tobacco: Never Used  Substance Use Topics  . Alcohol use: Yes  . Drug use: Yes    Types: Marijuana    Comment: occ     Allergies   Patient has no known allergies.   Review of Systems Review of Systems  Constitutional: Positive for fatigue and unexpected weight change.  Endocrine: Positive for polydipsia, polyphagia and polyuria.  Neurological: Positive for weakness.  All other systems reviewed and are negative.    Physical Exam Updated Vital Signs BP (!) 138/96 (BP Location: Left Arm)   Pulse 91   Temp 98.2 F (36.8 C) (Oral)   Resp 20   Ht 5\' 8"  (1.727 m)   Wt 84.9 kg (187 lb 3.2 oz)   LMP 05/08/2013   SpO2 98%   BMI 28.46 kg/m   Physical Exam  Constitutional: She is oriented to person, place, and time. She appears well-developed and well-nourished. No distress.  HENT:  Head: Normocephalic and atraumatic.  Dry mucous membranes  Eyes: Conjunctivae are normal.  Neck: Neck supple.  Cardiovascular: Normal rate, regular rhythm and normal heart sounds. Exam reveals no friction rub.  No murmur heard. Pulmonary/Chest: Effort normal and breath sounds normal. No respiratory distress. She has no wheezes. She has no rales.    Abdominal: She exhibits no distension.  Musculoskeletal: She exhibits no edema.  Neurological: She is alert and oriented to person, place, and time. She exhibits normal muscle tone.  Skin: Skin is warm. Capillary refill takes less than 2 seconds.  Psychiatric: Her mood appears anxious.  Nursing note and vitals reviewed.    ED Treatments / Results  Labs (all labs ordered are listed, but only abnormal results are displayed) Labs Reviewed  BASIC METABOLIC PANEL - Abnormal; Notable for the following components:  Result Value   Sodium 130 (*)    Chloride 92 (*)    Glucose, Bld 651 (*)    BUN 21 (*)    Creatinine, Ser 1.22 (*)    GFR calc non Af Amer 51 (*)    GFR calc Af Amer 59 (*)    All other components within normal limits  URINALYSIS, ROUTINE W REFLEX MICROSCOPIC - Abnormal; Notable for the following components:   Color, Urine STRAW (*)    Glucose, UA >=500 (*)    Hgb urine dipstick SMALL (*)    Ketones, ur 20 (*)    Bacteria, UA RARE (*)    Squamous Epithelial / LPF 0-5 (*)    All other components within normal limits  CBG MONITORING, ED - Abnormal; Notable for the following components:   Glucose-Capillary 491 (*)    All other components within normal limits  CBG MONITORING, ED - Abnormal; Notable for the following components:   Glucose-Capillary 352 (*)    All other components within normal limits  CBC  TSH  TROPONIN I  TROPONIN I  TROPONIN I  HIV ANTIBODY (ROUTINE TESTING)  COMPREHENSIVE METABOLIC PANEL  CBC  HEMOGLOBIN A1C  LIPID PANEL  I-STAT TROPONIN, ED  I-STAT BETA HCG BLOOD, ED (MC, [facility], AP ONLY)  I-STAT TROPONIN, ED    EKG  EKG Interpretation  Date/Time:  Thursday July 06 2017 20:09:40 EST Ventricular Rate:  106 PR Interval:  178 QRS Duration: 94 QT Interval:  335 QTC Calculation: 445 R Axis:   66 Text Interpretation:  Sinus tachycardia Biatrial enlargement Probable LVH with secondary repol abnrm No significant change since last tracing  Confirmed by Duffy Bruce [phone]) on 07/06/2017 9:29:06 PM       Radiology Dg Chest 2 View  Result Date: 07/06/2017 CLINICAL DATA:  Frequent urination, dry mouth, some dizziness, sharp chest pain, smoking history EXAM: CHEST - 2 VIEW COMPARISON:  Chest x-ray of 02/04/2017 FINDINGS: No active infiltrate or effusion is seen. Mild biapical pleural thickening is present. Mediastinal and hilar contours are unremarkable. The heart is within normal limits in size. No acute bony abnormality is seen. IMPRESSION: No active cardiopulmonary disease. Electronically Signed   By: Ivar Drape M.D.   On: 07/06/2017 17:05    Procedures Procedures (including critical care time)  Medications Ordered in ED Medications  aspirin chewable tablet 81 mg (not administered)  atorvastatin (LIPITOR) tablet 40 mg (not administered)  carvedilol (COREG) tablet 6.25 mg (not administered)  furosemide (LASIX) tablet 20 mg (not administered)  losartan (COZAAR) tablet 25 mg (not administered)  spironolactone (ALDACTONE) tablet 25 mg (not administered)  glimepiride (AMARYL) tablet 2 mg (not administered)  enoxaparin (LOVENOX) injection 40 mg (not administered)  acetaminophen (TYLENOL) tablet 650 mg (not administered)    Or  acetaminophen (TYLENOL) suppository 650 mg (not administered)  sodium chloride flush (NS) 0.9 % injection 3 mL (not administered)  sodium chloride flush (NS) 0.9 % injection 3 mL (not administered)  0.9 %  sodium chloride infusion (not administered)  insulin aspart (novoLOG) injection 0-9 Units (not administered)  loratadine (CLARITIN) tablet 10 mg (not administered)  sodium chloride 0.9 % bolus 1,000 mL (0 mLs Intravenous Stopped 07/06/17 2205)  insulin aspart (novoLOG) injection 6 Units (6 Units Intravenous Given 07/06/17 2024)  sodium chloride 0.9 % bolus 1,000 mL (0 mLs Intravenous Stopped 07/06/17 2205)  insulin aspart (novoLOG) injection 6 Units (6 Units Intravenous Given 07/06/17 2205)  0.9 %  sodium  chloride infusion ( Intravenous Stopped  07/06/17 2327)     Initial Impression / Assessment and Plan / ED Course  I have reviewed the triage vital signs and the nursing notes.  Pertinent labs & imaging results that were available during my care of the patient were reviewed by me and considered in my medical decision making (see chart for details).     52 year old female with past medical history as above here with polyuria, polydipsia, and general fatigue.  Lab work is consistent with new onset DM with marked hyperglycemia. Pt given 2L NS, insulin, and monitored. She remains tachycardic to mid 100s despite fluids. BG remains 491. Mild ketones in urine and lab work shows AKI. Suspect this is 2/2 dehydration but given persistent tachycardia, will admit for fluids, insulin.   Final Clinical Impressions(s) / ED Diagnoses   Final diagnoses:  Diabetes mellitus, new onset [facility])  Dehydration    ED Discharge Orders    None       Duffy Bruce, MD 07/07/17 0040

## 2017-07-06 NOTE — ED Notes (Signed)
Pt asked this RN for snack and drink; this RN explained to pt that we are waiting on the EDP to evaluate her first before we can let her eat/drink. Pt stood up and stated to this RN, "well I am hungry and thirsty and y'all don't know what y'all are doing," pt keeps mumbling and walks down hallway into bathroom and fills her cup up with water. Pt eating cheetos and drinking water. EDP now at bedside.

## 2017-07-06 NOTE — ED Triage Notes (Signed)
Pt added that she is also having sharp chest pain

## 2017-07-06 NOTE — ED Triage Notes (Signed)
Pt states that she ran out of her BP and diuretic medication while in [place] and the Dr called in an emergency 3 day prescription, she took one dose and has been experiencing side effects since. Pt states that she has been urinating every hour and that she feels "dry" and has a dry mouth. She also c/o dizziness and vision problems that started after she took the medication

## 2017-07-06 NOTE — ED Notes (Signed)
EDP made aware boluses incomplete, wanting CBG checked at this time.

## 2017-07-06 NOTE — ED Notes (Signed)
Checked CBG 491, RN Jessica informed

## 2017-07-07 ENCOUNTER — Observation Stay (HOSPITAL_BASED_OUTPATIENT_CLINIC_OR_DEPARTMENT_OTHER): Payer: Self-pay

## 2017-07-07 ENCOUNTER — Other Ambulatory Visit: Payer: Self-pay

## 2017-07-07 DIAGNOSIS — I429 Cardiomyopathy, unspecified: Secondary | ICD-10-CM

## 2017-07-07 LAB — COMPREHENSIVE METABOLIC PANEL
ALT: 19 U/L (ref 14–54)
AST: 15 U/L (ref 15–41)
Albumin: 3.6 g/dL (ref 3.5–5.0)
Alkaline Phosphatase: 79 U/L (ref 38–126)
Anion gap: 12 (ref 5–15)
BUN: 11 mg/dL (ref 6–20)
CO2: 22 mmol/L (ref 22–32)
CREATININE: 0.62 mg/dL (ref 0.44–1.00)
Calcium: 8.4 mg/dL — ABNORMAL LOW (ref 8.9–10.3)
Chloride: 103 mmol/L (ref 101–111)
GFR calc non Af Amer: >60 mL/min (ref >60)
Glucose, Bld: 236 mg/dL — ABNORMAL HIGH (ref 65–99)
Potassium: 3.2 mmol/L — ABNORMAL LOW (ref 3.5–5.1)
SODIUM: 137 mmol/L (ref 135–145)
Total Bilirubin: 0.6 mg/dL (ref 0.3–1.2)
Total Protein: 6.4 g/dL — ABNORMAL LOW (ref 6.5–8.1)

## 2017-07-07 LAB — CBC
HCT: 38 % (ref 36.0–46.0)
Hemoglobin: 12.5 g/dL (ref 12.0–15.0)
MCH: 27.7 pg (ref 26.0–34.0)
MCHC: 32.9 g/dL (ref 30.0–36.0)
MCV: 84.3 fL (ref 78.0–100.0)
Platelets: 193 10*3/uL (ref 150–400)
RBC: 4.51 MIL/uL (ref 3.87–5.11)
RDW: 13.4 % (ref 11.5–15.5)
WBC: 4.4 10*3/uL (ref 4.0–10.5)

## 2017-07-07 LAB — LIPID PANEL
CHOL/HDL RATIO: 9.8 ratio
Cholesterol: 206 mg/dL — ABNORMAL HIGH (ref 0–200)
HDL: 21 mg/dL — AB (ref >40)
LDL Cholesterol: UNDETERMINED mg/dL (ref 0–99)
Triglycerides: 876 mg/dL — ABNORMAL HIGH (ref <150)
VLDL: UNDETERMINED mg/dL (ref 0–40)

## 2017-07-07 LAB — ECHOCARDIOGRAM COMPLETE
Height: 68 in
Weight: 2995.2 oz

## 2017-07-07 LAB — GLUCOSE, CAPILLARY
GLUCOSE-CAPILLARY: 249 mg/dL — AB (ref 65–99)
GLUCOSE-CAPILLARY: 266 mg/dL — AB (ref 65–99)
GLUCOSE-CAPILLARY: 273 mg/dL — AB (ref 65–99)
GLUCOSE-CAPILLARY: 357 mg/dL — AB (ref 65–99)
Glucose-Capillary: 185 mg/dL — ABNORMAL HIGH (ref 65–99)
Glucose-Capillary: 259 mg/dL — ABNORMAL HIGH (ref 65–99)
Glucose-Capillary: 257 mg/dL — ABNORMAL HIGH (ref 65–99)

## 2017-07-07 LAB — TSH: TSH: 2.277 u[IU]/mL (ref 0.350–4.500)

## 2017-07-07 LAB — TROPONIN I: Troponin I: <0.03 ng/mL (ref <0.03)

## 2017-07-07 LAB — HIV ANTIBODY (ROUTINE TESTING W REFLEX): HIV SCREEN 4TH GENERATION: NONREACTIVE

## 2017-07-07 MED ORDER — LIVING WELL WITH DIABETES BOOK
Freq: Once | Status: AC
  Administered 2017-07-07: 13:00:00
  Filled 2017-07-07: qty 1

## 2017-07-07 MED ORDER — LOSARTAN POTASSIUM 50 MG PO TABS
25.0000 mg | ORAL_TABLET | Freq: Every day | ORAL | Status: DC
  Administered 2017-07-07: 25 mg via ORAL
  Administered 2017-07-08: 11:00:00 via ORAL
  Administered 2017-07-09: 25 mg via ORAL
  Filled 2017-07-07 (×4): qty 1

## 2017-07-07 MED ORDER — ATORVASTATIN CALCIUM 40 MG PO TABS
40.0000 mg | ORAL_TABLET | Freq: Every day | ORAL | Status: DC
  Administered 2017-07-07 – 2017-07-10 (×4): 40 mg via ORAL
  Filled 2017-07-07 (×4): qty 1

## 2017-07-07 MED ORDER — ENOXAPARIN SODIUM 40 MG/0.4ML ~~LOC~~ SOLN
40.0000 mg | SUBCUTANEOUS | Status: DC
  Administered 2017-07-07 – 2017-07-10 (×4): 40 mg via SUBCUTANEOUS
  Filled 2017-07-07 (×4): qty 0.4

## 2017-07-07 MED ORDER — INSULIN ASPART 100 UNIT/ML ~~LOC~~ SOLN
0.0000 [IU] | Freq: Three times a day (TID) | SUBCUTANEOUS | Status: DC
  Administered 2017-07-07: 5 [IU] via SUBCUTANEOUS
  Administered 2017-07-08: 3 [IU] via SUBCUTANEOUS
  Administered 2017-07-08: 9 [IU] via SUBCUTANEOUS
  Administered 2017-07-08: 5 [IU] via SUBCUTANEOUS
  Administered 2017-07-09: 3 [IU] via SUBCUTANEOUS
  Administered 2017-07-09 (×2): 5 [IU] via SUBCUTANEOUS
  Administered 2017-07-09: 3 [IU] via SUBCUTANEOUS
  Administered 2017-07-10 (×2): 5 [IU] via SUBCUTANEOUS

## 2017-07-07 MED ORDER — INSULIN ASPART 100 UNIT/ML ~~LOC~~ SOLN
0.0000 [IU] | Freq: Every day | SUBCUTANEOUS | Status: DC
  Administered 2017-07-07: 5 [IU] via SUBCUTANEOUS
  Administered 2017-07-08: 4 [IU] via SUBCUTANEOUS

## 2017-07-07 MED ORDER — SODIUM CHLORIDE 0.9% FLUSH
3.0000 mL | INTRAVENOUS | Status: DC | PRN
  Administered 2017-07-10: 3 mL via INTRAVENOUS
  Filled 2017-07-07: qty 3

## 2017-07-07 MED ORDER — INSULIN ASPART 100 UNIT/ML ~~LOC~~ SOLN
0.0000 [IU] | SUBCUTANEOUS | Status: DC
  Administered 2017-07-07: 9 [IU] via SUBCUTANEOUS
  Administered 2017-07-07: 2 [IU] via SUBCUTANEOUS
  Administered 2017-07-07: 3 [IU] via SUBCUTANEOUS
  Administered 2017-07-07: 2 [IU] via SUBCUTANEOUS

## 2017-07-07 MED ORDER — ACETAMINOPHEN 650 MG RE SUPP: 650.0000 mg | Freq: Four times a day (QID) | RECTAL | Status: DC | PRN

## 2017-07-07 MED ORDER — ACETAMINOPHEN 325 MG PO TABS
650.0000 mg | ORAL_TABLET | Freq: Four times a day (QID) | ORAL | Status: DC | PRN
  Administered 2017-07-09: 650 mg via ORAL
  Filled 2017-07-07: qty 2

## 2017-07-07 MED ORDER — ASPIRIN 81 MG PO CHEW
81.0000 mg | CHEWABLE_TABLET | Freq: Every day | ORAL | Status: DC
  Administered 2017-07-07 – 2017-07-10 (×4): 81 mg via ORAL
  Filled 2017-07-07 (×4): qty 1

## 2017-07-07 MED ORDER — CARVEDILOL 6.25 MG PO TABS
6.2500 mg | ORAL_TABLET | Freq: Two times a day (BID) | ORAL | Status: DC
  Administered 2017-07-07 – 2017-07-10 (×6): 6.25 mg via ORAL
  Filled 2017-07-07 (×7): qty 1

## 2017-07-07 MED ORDER — INSULIN STARTER KIT- PEN NEEDLES (ENGLISH)
1.0000 | Freq: Once | Status: DC
  Filled 2017-07-07: qty 1

## 2017-07-07 MED ORDER — SODIUM CHLORIDE 0.9% FLUSH
3.0000 mL | Freq: Two times a day (BID) | INTRAVENOUS | Status: DC
  Administered 2017-07-07 – 2017-07-09 (×3): 3 mL via INTRAVENOUS

## 2017-07-07 MED ORDER — SODIUM CHLORIDE 0.9 % IV SOLN
INTRAVENOUS | Status: AC
  Administered 2017-07-07 (×2): via INTRAVENOUS

## 2017-07-07 MED ORDER — INSULIN GLARGINE 100 UNIT/ML ~~LOC~~ SOLN
15.0000 [IU] | Freq: Every day | SUBCUTANEOUS | Status: DC
  Administered 2017-07-07 – 2017-07-08 (×2): 15 [IU] via SUBCUTANEOUS
  Filled 2017-07-07 (×3): qty 0.15

## 2017-07-07 MED ORDER — FUROSEMIDE 20 MG PO TABS
20.0000 mg | ORAL_TABLET | Freq: Every day | ORAL | Status: DC
  Administered 2017-07-07 – 2017-07-08 (×2): 20 mg via ORAL
  Filled 2017-07-07 (×2): qty 1

## 2017-07-07 MED ORDER — SPIRONOLACTONE 25 MG PO TABS
25.0000 mg | ORAL_TABLET | Freq: Every day | ORAL | Status: DC
  Administered 2017-07-07: 25 mg via ORAL
  Filled 2017-07-07: qty 1

## 2017-07-07 MED ORDER — GLIMEPIRIDE 2 MG PO TABS
2.0000 mg | ORAL_TABLET | Freq: Every day | ORAL | Status: DC
  Administered 2017-07-07 – 2017-07-10 (×4): 2 mg via ORAL
  Filled 2017-07-07 (×4): qty 1

## 2017-07-07 MED ORDER — SODIUM CHLORIDE 0.9 % IV SOLN
250.0000 mL | INTRAVENOUS | Status: DC | PRN
  Administered 2017-07-07: 250 mL via INTRAVENOUS

## 2017-07-07 MED ORDER — LORATADINE 10 MG PO TABS
10.0000 mg | ORAL_TABLET | Freq: Every day | ORAL | Status: DC
  Administered 2017-07-07 – 2017-07-10 (×4): 10 mg via ORAL
  Filled 2017-07-07 (×4): qty 1

## 2017-07-07 NOTE — Progress Notes (Signed)
Attempted to have patient self inject lantus.  However, patient became very tearful and was unable to make the injection.  She will try again during dinner.  Patient given information on starting diabetes videos as she was not ready to watch them at this time.  Will continue to attempt teaching with the patient.

## 2017-07-07 NOTE — Progress Notes (Signed)
Inpatient Diabetes Program Recommendations  AACE/ADA: New Consensus Statement on Inpatient Glycemic Control (2015)  Target Ranges:  Prepandial:   less than 140 mg/dL      Peak postprandial:   less than 180 mg/dL (1-2 hours)      Critically ill patients:  140 - 180 mg/dL   Lab Results  Component Value Date   GLUCAP 185 (H) 07/07/2017    Review of Glycemic Control Results for Robin Pace, Robin Pace (MRN [id number]) as of 07/07/2017 10:56  Ref. Range 07/06/2017 23:28 07/07/2017 00:58 07/07/2017 04:15 07/07/2017 07:42  Glucose-Capillary Latest Ref Range: 65 - 99 mg/dL 352 (H) 357 (H) 273 (H) 185 (H)   Diabetes history: New onset DM Outpatient Diabetes medications: none Current orders for Inpatient glycemic control: Amaryl 2 mg in AM, Novolog 0-9 Units Q4H  Inpatient Diabetes Program Recommendations:    A1C- pending.  Ordered LWWDM booklet, out patient education and case management to help provide PCP list to help patient establish care.   At this time would recommend Lantus 15 Units (85kg X 0.2) QD and Novolog 0-9 Units TIDAC + 0-5 Units QHS.  Thanks, Bronson Curb, MSN, RNC-OB Diabetes Coordinator [phone] (8a-5p)

## 2017-07-07 NOTE — Progress Notes (Signed)
PROGRESS NOTE    Tawna Alwin  PPJ:[id number] DOB: February 02, 1966 DOA: 07/06/2017 PCP: Patient, No Pcp Per  Brief Narrative:Alyn Carley  is a 52 y.o. female, w hypertension, CHF ( EF 30% ), nonischemic cardiomyopathy, (cardiac cath 12/15/2015=> nl coronaries, Rozann Lesches ) w temp defibrillator for 60months.   C/o polyuria x2 days and dry mouth and blurred vision,   Pt presented due to thought that she was having allergic reaction to her medication. She was found to have severe hyperglycemia, hemoglobin of 651 with mild acute kidney injury,and glucoseuria  Assessment & Plan:   Hyperglycemia/new onset type 2 diabetes -Start Lantus, sliding scale insulin -insulin teaching today -Follow-up hemoglobin A1c -Diabetes coordinator consult and dietitian consult -Started on Amaryl as well -needs PCP  Dehydration/mild acute kidney injury -Due to osmotic diuresis from severe hyperglycemia -improved, continue IV fluids 1 more day  History of nonischemic cardiomyopathy EF of 30% -History of normal coronaries on cardiac cath 12/2015 -Continue Coreg, losartan -Repeat 2-D echocardiogram -Clinically euvolemic, hold diuretics  Dyslipidemia -Continue Lipitor  DVT prophylaxis:Lovenox Code Status: full code Family Communication:no family at bedside Disposition Plan: home tomorrow if stable    Procedures:   Antimicrobials:    Subjective: -feels better, visual blurring has resolved  Objective: Vitals:   07/06/17 2346 07/06/17 2353 07/07/17 0529 07/07/17 1333  BP:  (!) 138/96 (!) 132/92 101/63  Pulse:  91 86 94  Resp:  20 18 17   Temp:  98.2 F (36.8 C) 98 F (36.7 C) 98.5 F (36.9 C)  TempSrc:  Oral Oral   SpO2:  98% 96% 98%  Weight: 84.9 kg (187 lb 3.2 oz)     Height: 5\' 8"  (1.727 m)       Intake/Output Summary (Last 24 hours) at 07/07/2017 1337 Last data filed at 07/07/2017 0650 Gross per 24 hour  Intake 2187.84 ml  Output 500 ml  Net 1687.84 ml   Filed Weights   07/06/17 1625  07/06/17 2346  Weight: 87.5 kg (193 lb) 84.9 kg (187 lb 3.2 oz)    Examination:  General exam: Appears calm and comfortable  Respiratory system: Clear to auscultation. Respiratory effort normal. Cardiovascular system: S1 & S2 heard, RRR Gastrointestinal system: Abdomen is nondistended, soft and nontender.Normal bowel sounds heard. Central nervous system: Alert and oriented. No focal neurological deficits. Extremities: Symmetric 5 x 5 power. Skin: No rashes, lesions or ulcers Psychiatry: Judgement and insight appear normal. Mood & affect appropriate.     Data Reviewed:   CBC: Recent Labs  Lab 07/06/17 1641 07/07/17 0507  WBC 7.1 4.4  HGB 14.2 12.5  HCT 41.6 38.0  MCV 83.0 84.3  PLT 238 580   Basic Metabolic Panel: Recent Labs  Lab 07/06/17 1641 07/07/17 0507  NA 130* 137  K 4.3 3.2*  CL 92* 103  CO2 24 22  GLUCOSE 651* 236*  BUN 21* 11  CREATININE 1.22* 0.62  CALCIUM 9.6 8.4*   GFR: Estimated Creatinine Clearance: 96 mL/min (by C-G formula based on SCr of 0.62 mg/dL). Liver Function Tests: Recent Labs  Lab 07/07/17 0507  AST 15  ALT 19  ALKPHOS 79  BILITOT 0.6  PROT 6.4*  ALBUMIN 3.6   No results for input(s): LIPASE, AMYLASE in the last 168 hours. No results for input(s): AMMONIA in the last 168 hours. Coagulation Profile: No results for input(s): INR, PROTIME in the last 168 hours. Cardiac Enzymes: Recent Labs  Lab 07/06/17 2308 07/07/17 0507  TROPONINI <0.03 <0.03   BNP (last 3  results) No results for input(s): PROBNP in the last 8760 hours. HbA1C: No results for input(s): HGBA1C in the last 72 hours. CBG: Recent Labs  Lab 07/06/17 2328 07/07/17 0058 07/07/17 0415 07/07/17 0742 07/07/17 1247  GLUCAP 352* 357* 273* 185* 249*   Lipid Profile: Recent Labs    07/07/17 0507  CHOL 206*  HDL 21*  LDLCALC UNABLE TO CALCULATE IF TRIGLYCERIDE OVER 400 mg/dL  TRIG 876*  CHOLHDL 9.8   Thyroid Function Tests: Recent Labs     07/06/17 2308  TSH 2.277   Anemia Panel: No results for input(s): VITAMINB12, FOLATE, FERRITIN, TIBC, IRON, RETICCTPCT in the last 72 hours. Urine analysis:    Component Value Date/Time   COLORURINE STRAW (A) 07/06/2017 1956   APPEARANCEUR CLEAR 07/06/2017 1956   LABSPEC 1.029 07/06/2017 1956   PHURINE 5.0 07/06/2017 1956   GLUCOSEU >=500 (A) 07/06/2017 1956   HGBUR SMALL (A) 07/06/2017 1956   BILIRUBINUR NEGATIVE 07/06/2017 1956   KETONESUR 20 (A) 07/06/2017 1956   PROTEINUR NEGATIVE 07/06/2017 1956   UROBILINOGEN 0.2 09/12/2013 1210   NITRITE NEGATIVE 07/06/2017 1956   LEUKOCYTESUR NEGATIVE 07/06/2017 1956   Sepsis Labs: @LABRCNTIP (procalcitonin:4,lacticidven:4)  )No results found for this or any previous visit (from the past 240 hour(s)).       Radiology Studies: Dg Chest 2 View  Result Date: 07/06/2017 CLINICAL DATA:  Frequent urination, dry mouth, some dizziness, sharp chest pain, smoking history EXAM: CHEST - 2 VIEW COMPARISON:  Chest x-ray of 02/04/2017 FINDINGS: No active infiltrate or effusion is seen. Mild biapical pleural thickening is present. Mediastinal and hilar contours are unremarkable. The heart is within normal limits in size. No acute bony abnormality is seen. IMPRESSION: No active cardiopulmonary disease. Electronically Signed   By: Ivar Drape M.D.   On: 07/06/2017 17:05        Scheduled Meds: . aspirin  81 mg Oral Daily  . atorvastatin  40 mg Oral Daily  . carvedilol  6.25 mg Oral BID WC  . enoxaparin (LOVENOX) injection  40 mg Subcutaneous Q24H  . furosemide  20 mg Oral Daily  . glimepiride  2 mg Oral Q breakfast  . insulin aspart  0-5 Units Subcutaneous QHS  . insulin aspart  0-9 Units Subcutaneous TID WC  . insulin glargine  15 Units Subcutaneous Daily  . loratadine  10 mg Oral Daily  . losartan  25 mg Oral Daily  . sodium chloride flush  3 mL Intravenous Q12H   Continuous Infusions: . sodium chloride 250 mL (07/07/17 0044)     LOS:  0 days    Time spent: 71min    Domenic Polite, MD [facility] Page via [email or web address], password [email or web address] After 7PM please contact night-coverage  07/07/2017, 1:37 PM

## 2017-07-07 NOTE — Plan of Care (Signed)
  RD consulted for nutrition education regarding diabetes.   No results found for: HGBA1C  Case discussed with DM coordinator prior to visit; pt with new onset diabetes and is aware of new diagnosis. Living Well with Diabetes Booklet ordered, as well as educational videos. Per DM coordinator, pt is very overwhelmed with new diagnosis and basic education was provided.   Spoke with pt, who had a very flat affect and was tearful during visit. She reports that she is overwhelmed and does not believe that she can manage DM. RD actively listened to pt concerns. Reinforced DM coordinator conversation, focusing on decreasing added sugars in diet (especially sugar sweetened beverages). Offered to review handout with pt, however, pt politely declined. Encouraged pt to review education materials at her leisure as well as watch educational videos ordered. Provided pt with encouragement and emotional support.  RD provided "Carbohydrate Counting for People with Diabetes" handout from the Academy of [employer] and Dietetics. Discussed different food groups and their effects on blood sugar, emphasizing carbohydrate-containing foods. Provided list of carbohydrates and recommended serving sizes of common foods.  Discussed importance of controlled and consistent carbohydrate intake throughout the day. Provided examples of ways to balance meals/snacks and encouraged intake of high-fiber, whole grain complex carbohydrates. Teach back method used.  Expect fair compliance.  Body mass index is 28.46 kg/m. Pt meets criteria for overweight based on current BMI.  Current diet order is Carb Modified, patient is consuming approximately n/a% of meals at this time. Labs and medications reviewed. No further nutrition interventions warranted at this time. RD contact information provided. If additional nutrition issues arise, please re-consult RD.  Yoshiaki Kreuser A. Jimmye Norman, RD, LDN, CDE Pager: [phone] After hours Pager: [phone]

## 2017-07-07 NOTE — Plan of Care (Signed)
  Progressing Education: Knowledge of General Education information will improve 07/07/2017 0456 - Progressing by Aris Lot, RN Health Behavior/Discharge Planning: Ability to manage health-related needs will improve 07/07/2017 0456 - Progressing by Aris Lot, RN Clinical Measurements: Ability to maintain clinical measurements within normal limits will improve 07/07/2017 0456 - Progressing by Aris Lot, RN Will remain free from infection 07/07/2017 0456 - Progressing by Aris Lot, RN Diagnostic test results will improve 07/07/2017 0456 - Progressing by Aris Lot, RN Respiratory complications will improve 07/07/2017 0456 - Progressing by Aris Lot, RN Cardiovascular complication will be avoided 07/07/2017 0456 - Progressing by Aris Lot, RN Activity: Risk for activity intolerance will decrease 07/07/2017 0456 - Progressing by Aris Lot, RN Nutrition: Adequate nutrition will be maintained 07/07/2017 0456 - Progressing by Aris Lot, RN Coping: Level of anxiety will decrease 07/07/2017 0456 - Progressing by Aris Lot, RN Elimination: Will not experience complications related to bowel motility 07/07/2017 0456 - Progressing by Aris Lot, RN Will not experience complications related to urinary retention 07/07/2017 0456 - Progressing by Aris Lot, RN Pain Managment: General experience of comfort will improve 07/07/2017 0456 - Progressing by Aris Lot, RN Safety: Ability to remain free from injury will improve 07/07/2017 0456 - Progressing by Aris Lot, RN Skin Integrity: Risk for impaired skin integrity will decrease 07/07/2017 0456 - Progressing by Aris Lot, RN

## 2017-07-07 NOTE — Progress Notes (Signed)
  Echocardiogram 2D Echocardiogram has been performed.  Darlina Sicilian M 07/07/2017, 11:56 AM

## 2017-07-07 NOTE — Progress Notes (Signed)
Inpatient Diabetes Program Recommendations  AACE/ADA: New Consensus Statement on Inpatient Glycemic Control (2015)  Target Ranges:  Prepandial:   less than 140 mg/dL      Peak postprandial:   less than 180 mg/dL (1-2 hours)      Critically ill patients:  140 - 180 mg/dL   Lab Results  Component Value Date   GLUCAP 249 (H) 07/07/2017    Diabetes history: New onset DM Outpatient Diabetes medications: none Current orders for Inpatient glycemic control: Amaryl 2 mg in AM, Novolog 0-9 Units Q4H, Lantus 15 units daily Inpatient Diabetes Program Recommendations:    Spoke with patient regarding new onset diabetes.  Patient was tearful and states that she feels overwhelmed.  We reviewed survival skills of DM including hyperglycemia, hypoglycemia, complications, how to treat low, normal blood sugars, basic information regarding diet such as eliminating sugar from beverages, and the importance of finding PCP.  She see's cardiologist in [facility] but does not have a PCP.  She recently moved to [place].  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 She is wondering if she needs to get disability and states that she called to inquire about disability today.  She does not have insurance.  Case management consult in place for medication assistance and potential follow-up with PCP.  Still awaiting A1C.  RN to allow patient to self-administer insulin just in case she is d/c'd on insulin.  Patient states she is familiar with giving insulin and checking blood sugars and has her CNA.  Will need close follow up and more education regarding this new diagnosis.  Patient has been reading Living well with DM booklet as well.  She states that she has no further questions at this time.   Thanks,  Adah Perl, RN, BC-ADM Inpatient Diabetes Coordinator Pager [phone] (8a-5p)

## 2017-07-07 NOTE — Progress Notes (Signed)
Pt with complaint of difficulty swallowing. Assessed pt. VS stable and no swelling seen from oral assessment. Some tightness to neck muscles noted. Paged provider. Awaiting further orders. Will continue to monitor pt.

## 2017-07-08 LAB — BASIC METABOLIC PANEL
Anion gap: 10 (ref 5–15)
BUN: 11 mg/dL (ref 6–20)
CO2: 20 mmol/L — ABNORMAL LOW (ref 22–32)
CREATININE: 0.58 mg/dL (ref 0.44–1.00)
Calcium: 8.3 mg/dL — ABNORMAL LOW (ref 8.9–10.3)
Chloride: 104 mmol/L (ref 101–111)
GFR calc Af Amer: >60 mL/min (ref >60)
Glucose, Bld: 331 mg/dL — ABNORMAL HIGH (ref 65–99)
Potassium: 3.8 mmol/L (ref 3.5–5.1)
SODIUM: 134 mmol/L — AB (ref 135–145)

## 2017-07-08 LAB — CBC
HCT: 35.7 % — ABNORMAL LOW (ref 36.0–46.0)
Hemoglobin: 11.8 g/dL — ABNORMAL LOW (ref 12.0–15.0)
MCH: 28 pg (ref 26.0–34.0)
MCHC: 33.1 g/dL (ref 30.0–36.0)
MCV: 84.8 fL (ref 78.0–100.0)
PLATELETS: 180 10*3/uL (ref 150–400)
RBC: 4.21 MIL/uL (ref 3.87–5.11)
RDW: 13.5 % (ref 11.5–15.5)
WBC: 4.6 10*3/uL (ref 4.0–10.5)

## 2017-07-08 LAB — HEMOGLOBIN A1C
HEMOGLOBIN A1C: 11.5 % — AB (ref 4.8–5.6)
Mean Plasma Glucose: 283 mg/dL

## 2017-07-08 LAB — GLUCOSE, CAPILLARY
Glucose-Capillary: 282 mg/dL — ABNORMAL HIGH (ref 65–99)
Glucose-Capillary: 380 mg/dL — ABNORMAL HIGH (ref 65–99)
Glucose-Capillary: 202 mg/dL — ABNORMAL HIGH (ref 65–99)
Glucose-Capillary: 306 mg/dL — ABNORMAL HIGH (ref 65–99)

## 2017-07-08 MED ORDER — AMOXICILLIN 250 MG PO CAPS
250.0000 mg | ORAL_CAPSULE | Freq: Three times a day (TID) | ORAL | Status: DC
  Administered 2017-07-08 – 2017-07-10 (×7): 250 mg via ORAL
  Filled 2017-07-08 (×8): qty 1

## 2017-07-08 MED ORDER — INSULIN GLARGINE 100 UNIT/ML ~~LOC~~ SOLN
25.0000 [IU] | Freq: Every day | SUBCUTANEOUS | Status: DC
  Administered 2017-07-09: 25 [IU] via SUBCUTANEOUS
  Filled 2017-07-08: qty 0.25

## 2017-07-08 MED ORDER — SODIUM CHLORIDE 0.9 % IV SOLN
INTRAVENOUS | Status: DC
  Administered 2017-07-08: 05:00:00 via INTRAVENOUS

## 2017-07-08 MED ORDER — INSULIN GLARGINE 100 UNIT/ML ~~LOC~~ SOLN
15.0000 [IU] | Freq: Once | SUBCUTANEOUS | Status: AC
  Administered 2017-07-08: 15 [IU] via SUBCUTANEOUS
  Filled 2017-07-08: qty 0.15

## 2017-07-08 NOTE — Evaluation (Signed)
Clinical/Bedside Swallow Evaluation Patient Details  Name: Robin Pace MRN: [id number] Date of Birth: Jul 18, 1965  Today's Date: 07/08/2017 Time: SLP Start Time (ACUTE ONLY): 4098 SLP Stop Time (ACUTE ONLY): 0945 SLP Time Calculation (min) (ACUTE ONLY): 7 min  Past Medical History:  Past Medical History:  Diagnosis Date  . Abdominal hernia   . Anemia   . CHF (congestive heart failure) ([place])   . Fibromyalgia   . Fibromyalgia   . Headache   . Hypertension   . Numbness on right side   . Wears dentures    top   Past Surgical History:  Past Surgical History:  Procedure Laterality Date  . 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 CESAREAN SECTION    . HERNIA REPAIR    . INCISE AND DRAIN ABCESS  2005   rt middle finger   . MASS EXCISION Right 08/06/2012   Procedure: EXCISION OF LARGE MASS right FLANK WITH LIPO ASSISTANCE;  Surgeon: Cristine Polio, MD;  Location: [place];  Service: Plastics;  Laterality: Right;  . TUBAL LIGATION     HPI:  Pt is a 52 yo female who presented with polyuria x2 days, dry mouth, and blurred vision, found to be severely hyperglycemic with new onset type 2 diabetes. Pt reported difficult swallowing on 3/8 and therefore was made NPO pending SLP evaluation. After completion of evaluation, noted that during the time of the eval, diet order had been initiated and evaluation orders had been d/c. CXR 3/7 showed no active cardiopulmonary disease. PMH: numbness on R side, HTN, headache, fibromyalgia, CHF, anemia, and abdominal hernia   Assessment / Plan / Recommendation Clinical Impression  Pt presents with a functional appearing oropharyngeal swallow with no overt signs of aspiration. Her primary concern is odynophagia, and she reports that she has been started on medication for  that this morning. Note that during the time of this evaluation, diet order had been initiated by MD. Recommend to continue with regular textures and thin liquids. No further SLP needs identified - will sign off. SLP Visit Diagnosis: Dysphagia, unspecified (R13.10)    Aspiration Risk  No limitations    Diet Recommendation Regular;Thin liquid   Liquid Administration via: Cup;Straw Medication Administration: Whole meds with liquid Supervision: Patient able to self feed Compensations: Slow rate;Small sips/bites Postural Changes: Seated upright at 90 degrees    Other  Recommendations Oral Care Recommendations: Oral care BID   Follow up Recommendations None      Frequency and Duration            Prognosis        Swallow Study   General HPI: Pt is a 52 yo female who presented with polyuria x2 days, dry mouth, and blurred vision, found to be severely hyperglycemic with new onset type 2 diabetes. Pt reported difficult swallowing on 3/8 and therefore was made NPO pending SLP evaluation. CXR 3/7 showed no active cardiopulmonary disease. PMH: numbness on R side, HTN, headache, fibromyalgia, CHF, anemia, and abdominal hernia Type of Study: Bedside Swallow Evaluation Previous Swallow Assessment: none in chart Diet Prior to this Study: NPO Temperature Spikes Noted: No Respiratory Status: Room air History of Recent Intubation: No Behavior/Cognition: Alert;Cooperative;Pleasant mood Oral Care Completed by SLP: No Oral Cavity - Dentition: Adequate natural dentition Vision: Functional for self-feeding Self-Feeding Abilities: Able to feed self Patient Positioning:  Upright in bed Baseline Vocal Quality: Normal    Oral/Motor/Sensory Function     Ice Chips Ice chips: Not tested   Thin Liquid Thin Liquid: Within functional limits Presentation: Cup;Self Fed    Nectar Thick Nectar Thick Liquid: Not tested   Honey Thick Honey Thick Liquid: Not tested   Puree Puree: Not tested    Solid   GO   Solid: Within functional limits Presentation: Self [place] 07/08/2017,10:01 AM  Germain Osgood, M.A. CCC-SLP [phone]

## 2017-07-08 NOTE — Progress Notes (Signed)
PROGRESS NOTE    Foy Mungia  KVQ:[id number] DOB: 04/06/1966 DOA: 07/06/2017 PCP: Patient, No Pcp Per  Brief Narrative:Kalli Jaffe  is a 52 y.o. female, w hypertension, CHF ( EF 30% ), nonischemic cardiomyopathy, (cardiac cath 12/15/2015=> nl coronaries, Rozann Lesches ) w temp defibrillator for 43month.   C/o polyuria x2 days and dry mouth and blurred vision,   Pt presented due to thought that she was having allergic reaction to her medication. She was found to have severe hyperglycemia, hemoglobin of 651 with mild acute kidney injury,and glucoseuria  Assessment & Plan:   Hyperglycemia/new onset type 2 diabetes -continue lantus, increase dose Lantus, sliding scale insulin -insulin teaching  -hemoglobin A1c is 11.5 -Diabetes coordinator consult and dietitian consult appreciated -Started on Amaryl as well -needs PCP  Pharyngitis -very symptomatic, has Submand adenopathy -start Amoxicillin  Dehydration/mild acute kidney injury -Due to osmotic diuresis from severe hyperglycemia -improved  History of nonischemic cardiomyopathy EF of 30% -History of normal coronaries on cardiac cath 12/2015 -Continue Coreg, losartan - 2-D echocardiogram with preserved EF -Clinically euvolemic, hold diuretics  Dyslipidemia -Continue Lipitor  DVT prophylaxis:Lovenox Code Status: full code Family Communication:no family at bedside Disposition Plan: home tomorrow if stable    Procedures:   Antimicrobials:    Subjective: -having severe sore throat and diff swallowing  Objective: Vitals:   07/07/17 1333 07/07/17 1645 07/07/17 2102 07/08/17 0500  BP: 101/63 98/63 110/74 126/87  Pulse: 94 88 87 85  Resp: 17  16 16   Temp: 98.5 F (36.9 C)  98 F (36.7 C) 98 F (36.7 C)  TempSrc:   Oral Oral  SpO2: 98%  97% 99%  Weight:      Height:        Intake/Output Summary (Last 24 hours) at 07/08/2017 1148 Last data filed at 07/08/2017 0431 Gross per 24 hour  Intake 520.42 ml  Output -  Net  520.42 ml   Filed Weights   07/06/17 1625 07/06/17 2346  Weight: 87.5 kg (193 lb) 84.9 kg (187 lb 3.2 oz)    Examination:  General exam: Appears calm and comfortable  HEENT: Pharyngeal erythema and submand lymphadenopathy Respiratory system: Clear to auscultation. Respiratory effort normal. Cardiovascular system: S1 & S2 heard, RRR Gastrointestinal system: Abdomen is nondistended, soft and nontender.Normal bowel sounds heard. Central nervous system: Alert and oriented. No focal neurological deficits. Extremities: Symmetric 5 x 5 power. Skin: No rashes, lesions or ulcers Psychiatry: Judgement and insight appear normal. Mood & affect appropriate.     Data Reviewed:   CBC: Recent Labs  Lab 07/06/17 1641 07/07/17 0507 07/08/17 0512  WBC 7.1 4.4 4.6  HGB 14.2 12.5 11.8*  HCT 41.6 38.0 35.7*  MCV 83.0 84.3 84.8  PLT 238 193 1643  Basic Metabolic Panel: Recent Labs  Lab 07/06/17 1641 07/07/17 0507 07/08/17 0512  NA 130* 137 134*  K 4.3 3.2* 3.8  CL 92* 103 104  CO2 24 22 20*  GLUCOSE 651* 236* 331*  BUN 21* 11 11  CREATININE 1.22* 0.62 0.58  CALCIUM 9.6 8.4* 8.3*   GFR: Estimated Creatinine Clearance: 96 mL/min (by C-G formula based on SCr of 0.58 mg/dL). Liver Function Tests: Recent Labs  Lab 07/07/17 0507  AST 15  ALT 19  ALKPHOS 79  BILITOT 0.6  PROT 6.4*  ALBUMIN 3.6   No results for input(s): LIPASE, AMYLASE in the last 168 hours. No results for input(s): AMMONIA in the last 168 hours. Coagulation Profile: No results for input(s): INR, PROTIME  in the last 168 hours. Cardiac Enzymes: Recent Labs  Lab 07/06/17 2308 07/07/17 0507  TROPONINI <0.03 <0.03   BNP (last 3 results) No results for input(s): PROBNP in the last 8760 hours. HbA1C: Recent Labs    07/07/17 0507  HGBA1C 11.5*   CBG: Recent Labs  Lab 07/07/17 1247 07/07/17 1652 07/07/17 2101 07/07/17 2308 07/08/17 0822  GLUCAP 249* 266* 259* 257* 282*   Lipid Profile: Recent  Labs    07/07/17 0507  CHOL 206*  HDL 21*  LDLCALC UNABLE TO CALCULATE IF TRIGLYCERIDE OVER 400 mg/dL  TRIG 876*  CHOLHDL 9.8   Thyroid Function Tests: Recent Labs    07/06/17 2308  TSH 2.277   Anemia Panel: No results for input(s): VITAMINB12, FOLATE, FERRITIN, TIBC, IRON, RETICCTPCT in the last 72 hours. Urine analysis:    Component Value Date/Time   COLORURINE STRAW (A) 07/06/2017 1956   APPEARANCEUR CLEAR 07/06/2017 1956   LABSPEC 1.029 07/06/2017 1956   PHURINE 5.0 07/06/2017 1956   GLUCOSEU >=500 (A) 07/06/2017 1956   HGBUR SMALL (A) 07/06/2017 1956   BILIRUBINUR NEGATIVE 07/06/2017 1956   KETONESUR 20 (A) 07/06/2017 1956   PROTEINUR NEGATIVE 07/06/2017 1956   UROBILINOGEN 0.2 09/12/2013 1210   NITRITE NEGATIVE 07/06/2017 1956   LEUKOCYTESUR NEGATIVE 07/06/2017 1956   Sepsis Labs: @LABRCNTIP (procalcitonin:4,lacticidven:4)  )No results found for this or any previous visit (from the past 240 hour(s)).       Radiology Studies: Dg Chest 2 View  Result Date: 07/06/2017 CLINICAL DATA:  Frequent urination, dry mouth, some dizziness, sharp chest pain, smoking history EXAM: CHEST - 2 VIEW COMPARISON:  Chest x-ray of 02/04/2017 FINDINGS: No active infiltrate or effusion is seen. Mild biapical pleural thickening is present. Mediastinal and hilar contours are unremarkable. The heart is within normal limits in size. No acute bony abnormality is seen. IMPRESSION: No active cardiopulmonary disease. Electronically Signed   By: Ivar Drape M.D.   On: 07/06/2017 17:05        Scheduled Meds: . amoxicillin  250 mg Oral Q8H  . aspirin  81 mg Oral Daily  . atorvastatin  40 mg Oral Daily  . carvedilol  6.25 mg Oral BID WC  . enoxaparin (LOVENOX) injection  40 mg Subcutaneous Q24H  . furosemide  20 mg Oral Daily  . glimepiride  2 mg Oral Q breakfast  . insulin aspart  0-5 Units Subcutaneous QHS  . insulin aspart  0-9 Units Subcutaneous TID WC  . insulin glargine  15 Units  Subcutaneous Once  . [START ON 07/09/2017] insulin glargine  25 Units Subcutaneous Daily  . insulin starter kit- pen needles  1 kit Other Once  . loratadine  10 mg Oral Daily  . losartan  25 mg Oral Daily  . sodium chloride flush  3 mL Intravenous Q12H   Continuous Infusions: . sodium chloride 250 mL (07/07/17 0044)     LOS: 1 day    Time spent: 37mn    PDomenic Polite MD [facility] Page via [email or web address], password [email or web address] After 7PM please contact night-coverage  07/08/2017, 11:48 AM

## 2017-07-09 LAB — BASIC METABOLIC PANEL
Anion gap: 9 (ref 5–15)
BUN: 7 mg/dL (ref 6–20)
CALCIUM: 8.6 mg/dL — AB (ref 8.9–10.3)
CO2: 22 mmol/L (ref 22–32)
Chloride: 103 mmol/L (ref 101–111)
Creatinine, Ser: 0.6 mg/dL (ref 0.44–1.00)
GFR calc non Af Amer: >60 mL/min (ref >60)
Glucose, Bld: 277 mg/dL — ABNORMAL HIGH (ref 65–99)
Potassium: 3.9 mmol/L (ref 3.5–5.1)
SODIUM: 134 mmol/L — AB (ref 135–145)

## 2017-07-09 LAB — CBC
HEMATOCRIT: 36.4 % (ref 36.0–46.0)
Hemoglobin: 12 g/dL (ref 12.0–15.0)
MCH: 27.6 pg (ref 26.0–34.0)
MCHC: 33 g/dL (ref 30.0–36.0)
MCV: 83.9 fL (ref 78.0–100.0)
Platelets: 173 10*3/uL (ref 150–400)
RBC: 4.34 MIL/uL (ref 3.87–5.11)
RDW: 13.3 % (ref 11.5–15.5)
WBC: 4.4 10*3/uL (ref 4.0–10.5)

## 2017-07-09 LAB — GLUCOSE, CAPILLARY
GLUCOSE-CAPILLARY: 289 mg/dL — AB (ref 65–99)
GLUCOSE-CAPILLARY: 248 mg/dL — AB (ref 65–99)
Glucose-Capillary: 242 mg/dL — ABNORMAL HIGH (ref 65–99)
Glucose-Capillary: 173 mg/dL — ABNORMAL HIGH (ref 65–99)

## 2017-07-09 MED ORDER — INSULIN GLARGINE 100 UNIT/ML ~~LOC~~ SOLN
40.0000 [IU] | Freq: Every day | SUBCUTANEOUS | Status: DC
  Administered 2017-07-10: 40 [IU] via SUBCUTANEOUS
  Filled 2017-07-09: qty 0.4

## 2017-07-09 MED ORDER — INSULIN GLARGINE 100 UNIT/ML ~~LOC~~ SOLN
15.0000 [IU] | Freq: Once | SUBCUTANEOUS | Status: AC
  Administered 2017-07-09: 15 [IU] via SUBCUTANEOUS
  Filled 2017-07-09: qty 0.15

## 2017-07-09 NOTE — Progress Notes (Signed)
PROGRESS NOTE    Robin Pace  BSJ:[id number] DOB: 1965-11-19 DOA: 07/06/2017 PCP: Patient, No Pcp Per  Brief Narrative:Robin Pace  is a 52 y.o. female, w hypertension, CHF ( EF 30% ), nonischemic cardiomyopathy, (cardiac cath 12/15/2015=> nl coronaries, Rozann Lesches ) w temp defibrillator for 37month.   C/o polyuria x2 days and dry mouth and blurred vision,   Pt presented due to thought that she was having allergic reaction to her medication. She was found to have severe hyperglycemia, hemoglobin of 651 with mild acute kidney injury,and glucoseuria  Assessment & Plan:   Hyperglycemia/new onset type 2 diabetes -continue lantus, need to increase Lantus dosing again -insulin teaching  -hemoglobin A1c is 11.5 -Diabetes coordinator consult and dietitian consult appreciated -Started on Amaryl as well -needs PCP, case management consultation for medication assistance and indigent clinic follow-up  Pharyngitis -very symptomatic, has Submand adenopathy -Improving, continue amoxicillin  Dehydration/mild acute kidney injury -Due to osmotic diuresis from severe hyperglycemia -improved  History of nonischemic cardiomyopathy EF of 30% -History of normal coronaries on cardiac cath 12/2015 -Continue Coreg, losartan - 2-D echocardiogram with preserved EF -Clinically euvolemic, hold diuretics, may need Lasix when necessary at discharge  Dyslipidemia -Continue Lipitor  DVT prophylaxis:Lovenox Code Status: full code Family Communication:no family at bedside Disposition Plan: home tomorrow after case management set up PCP/follow-up and medication assistance, possibly orange card    Procedures:   Antimicrobials:    Subjective: -Tearful today, very upset since she cannot work any longer and will not be able to afford rent/medications  Objective: Vitals:   07/08/17 0500 07/08/17 1548 07/08/17 2100 07/09/17 0700  BP: 126/87 114/77 117/73 115/73  Pulse: 85 85 93 83  Resp: 16 18 18      Temp: 98 F (36.7 C) 97.9 F (36.6 C) 98.6 F (37 C) 98.7 F (37.1 C)  TempSrc: Oral Oral Oral Oral  SpO2: 99% 99% 99% 98%  Weight:      Height:       No intake or output data in the 24 hours ending 07/09/17 1234 Filed Weights   07/06/17 1625 07/06/17 2346  Weight: 87.5 kg (193 lb) 84.9 kg (187 lb 3.2 oz)    Examination:  Gen: Awake, Alert, Oriented X 3,  HEENT: Pharyngeal erythema, submandibular lymphadenopathy improving Lungs: Good air movement bilaterally, CTAB CVS: RRR,No Gallops,Rubs or new Murmurs Abd: soft, Non tender, non distended, BS present Extremities: No Cyanosis, Clubbing or edema Skin: no new rashes Psychiatry: Upset tearful but inability to work, get medications,    Data Reviewed:   CBC: Recent Labs  Lab 07/06/17 1641 07/07/17 0507 07/08/17 0512 07/09/17 0701  WBC 7.1 4.4 4.6 4.4  HGB 14.2 12.5 11.8* 12.0  HCT 41.6 38.0 35.7* 36.4  MCV 83.0 84.3 84.8 83.9  PLT 238 193 180 1765  Basic Metabolic Panel: Recent Labs  Lab 07/06/17 1641 07/07/17 0507 07/08/17 0512 07/09/17 0701  NA 130* 137 134* 134*  K 4.3 3.2* 3.8 3.9  CL 92* 103 104 103  CO2 24 22 20* 22  GLUCOSE 651* 236* 331* 277*  BUN 21* 11 11 7   CREATININE 1.22* 0.62 0.58 0.60  CALCIUM 9.6 8.4* 8.3* 8.6*   GFR: Estimated Creatinine Clearance: 96 mL/min (by C-G formula based on SCr of 0.6 mg/dL). Liver Function Tests: Recent Labs  Lab 07/07/17 0507  AST 15  ALT 19  ALKPHOS 79  BILITOT 0.6  PROT 6.4*  ALBUMIN 3.6   No results for input(s): LIPASE, AMYLASE in the last  168 hours. No results for input(s): AMMONIA in the last 168 hours. Coagulation Profile: No results for input(s): INR, PROTIME in the last 168 hours. Cardiac Enzymes: Recent Labs  Lab 07/06/17 2308 07/07/17 0507  TROPONINI <0.03 <0.03   BNP (last 3 results) No results for input(s): PROBNP in the last 8760 hours. HbA1C: Recent Labs    07/07/17 0507  HGBA1C 11.5*   CBG: Recent Labs  Lab  07/08/17 0822 07/08/17 1248 07/08/17 1713 07/08/17 2207 07/09/17 0712  GLUCAP 282* 380* 202* 306* 289*   Lipid Profile: Recent Labs    07/07/17 0507  CHOL 206*  HDL 21*  LDLCALC UNABLE TO CALCULATE IF TRIGLYCERIDE OVER 400 mg/dL  TRIG 876*  CHOLHDL 9.8   Thyroid Function Tests: Recent Labs    07/06/17 2308  TSH 2.277   Anemia Panel: No results for input(s): VITAMINB12, FOLATE, FERRITIN, TIBC, IRON, RETICCTPCT in the last 72 hours. Urine analysis:    Component Value Date/Time   COLORURINE STRAW (A) 07/06/2017 1956   APPEARANCEUR CLEAR 07/06/2017 1956   LABSPEC 1.029 07/06/2017 1956   PHURINE 5.0 07/06/2017 1956   GLUCOSEU >=500 (A) 07/06/2017 1956   HGBUR SMALL (A) 07/06/2017 1956   BILIRUBINUR NEGATIVE 07/06/2017 1956   KETONESUR 20 (A) 07/06/2017 1956   PROTEINUR NEGATIVE 07/06/2017 1956   UROBILINOGEN 0.2 09/12/2013 1210   NITRITE NEGATIVE 07/06/2017 1956   LEUKOCYTESUR NEGATIVE 07/06/2017 1956   Sepsis Labs: @LABRCNTIP (procalcitonin:4,lacticidven:4)  )No results found for this or any previous visit (from the past 240 hour(s)).       Radiology Studies: No results found.      Scheduled Meds: . amoxicillin  250 mg Oral Q8H  . aspirin  81 mg Oral Daily  . atorvastatin  40 mg Oral Daily  . carvedilol  6.25 mg Oral BID WC  . enoxaparin (LOVENOX) injection  40 mg Subcutaneous Q24H  . glimepiride  2 mg Oral Q breakfast  . insulin aspart  0-5 Units Subcutaneous QHS  . insulin aspart  0-9 Units Subcutaneous TID WC  . [START ON 07/10/2017] insulin glargine  40 Units Subcutaneous Daily  . insulin starter kit- pen needles  1 kit Other Once  . loratadine  10 mg Oral Daily  . losartan  25 mg Oral Daily  . sodium chloride flush  3 mL Intravenous Q12H   Continuous Infusions: . sodium chloride 250 mL (07/07/17 0044)     LOS: 2 days    Time spent: 52mn    PDomenic Polite MD [facility] Page via [email or web address], password [email or web address] After 7PM please  contact night-coverage  07/09/2017, 12:34 PM

## 2017-07-10 LAB — GLUCOSE, CAPILLARY
GLUCOSE-CAPILLARY: 249 mg/dL — AB (ref 65–99)
Glucose-Capillary: 251 mg/dL — ABNORMAL HIGH (ref 65–99)

## 2017-07-10 MED ORDER — CARVEDILOL 6.25 MG PO TABS: 6.2500 mg | ORAL_TABLET | Freq: Two times a day (BID) | ORAL | 0 refills | Status: DC

## 2017-07-10 MED ORDER — LOSARTAN POTASSIUM 25 MG PO TABS: 25.0000 mg | ORAL_TABLET | Freq: Every day | ORAL | 0 refills | Status: DC

## 2017-07-10 MED ORDER — FUROSEMIDE 40 MG PO TABS: 20.0000 mg | ORAL_TABLET | Freq: Every day | ORAL | 0 refills | Status: DC

## 2017-07-10 MED ORDER — GLIMEPIRIDE 2 MG PO TABS: 2.0000 mg | ORAL_TABLET | Freq: Every day | ORAL | 0 refills | Status: DC

## 2017-07-10 MED ORDER — BLOOD GLUCOSE MONITOR KIT: PACK | 0 refills | Status: AC

## 2017-07-10 MED ORDER — AMOXICILLIN 250 MG PO CAPS: 250.0000 mg | ORAL_CAPSULE | Freq: Three times a day (TID) | ORAL | 0 refills | Status: DC

## 2017-07-10 MED ORDER — INSULIN GLARGINE 100 UNIT/ML SOLOSTAR PEN: 45.0000 [IU] | PEN_INJECTOR | Freq: Every morning | SUBCUTANEOUS | 2 refills | Status: DC

## 2017-07-10 MED FILL — LANTUS SOLOSTAR 100 UNITS/M: 100 | 33 days supply | Qty: 15 | Fill #0

## 2017-07-10 MED FILL — LOSARTAN POTASSIUM 25 MG TA: 25 | 30 days supply | Qty: 30 | Fill #0

## 2017-07-10 MED FILL — AMOXICILLIN 250 MG CAPSULE: 250 | 2 days supply | Qty: 6 | Fill #0

## 2017-07-10 MED FILL — CARVEDILOL 6.25 MG TABLET: 6.25 | 30 days supply | Qty: 60 | Fill #0

## 2017-07-10 MED FILL — TRUEplus LANCETS 28G MISC: 25 days supply | Qty: 100 | Fill #0

## 2017-07-10 MED FILL — !TRUE METRIX BLOOD GLUCOSE: 365 days supply | Qty: 1 | Fill #0

## 2017-07-10 MED FILL — FUROSEMIDE 40 MG TAB: 40 | 30 days supply | Qty: 15 | Fill #0

## 2017-07-10 MED FILL — GLIMEPIRIDE 2 MG TABLET: 2 | 30 days supply | Qty: 30 | Fill #0

## 2017-07-10 NOTE — Care Management Note (Signed)
Case Management Note  Patient Details  Name: Robin Pace MRN: [id number] Date of Birth: [birth year]  Subjective/Objective:            Hypergylcemia. Pt without income, insurance , no PCP.     Action/Plan: NCM scheduled post hospital follow up appointment for 07/20/2017 @ 8:30 am with Zettie Pho NP @ the [facility].   NCM faxed Rx to [facility] @ [phone]. Match Letter explained and given to pt per NCM to assist with medication needs.  Pt states family to provide transportation to home.  Expected Discharge Date:  07/10/17               Expected Discharge Plan:  Home/Self Care  In-House Referral:     Discharge planning Services  Follow-up appt scheduled, CM Consult, [facility], [facility]  Post Acute Care Choice:   N/A Choice offered to:   N/A  DME Arranged:   N/A DME Agency:   N/A  HH Arranged:   N/A HH Agency:   N/A  Status of Service:  Completed, signed off  If discussed at [place] of Stay Meetings, dates discussed:    Additional Comments:  Sharin Mons, RN 07/10/2017, 10:17 AM

## 2017-07-10 NOTE — Discharge Summary (Signed)
Physician Discharge Summary  Azzure Garabedian ZOX:[id number] DOB: [birth year] DOA: 07/06/2017  PCP: Patient, No Pcp Per  Admit date: 07/06/2017 Discharge date: 07/10/2017  Time spent: 35 minutes  Recommendations for Outpatient Follow-up:  PCP in 1 week, FU Made for 3/21 at [facility] at 8:30am Medication scripts were faxed to [facility] and  Match Letter    Discharge Diagnoses:  Principal Problem:   Hyperglycemia   New DM type 2   Dehydration   Chronic systolic CHF now EF improved   Tachycardia   Diabetes mellitus without complication ([place])   Hypertension   Discharge Condition: improved  Diet recommendation: low sodium DIabetic  Filed Weights   07/06/17 2346 07/09/17 1812 07/10/17 0639  Weight: 84.9 kg (187 lb 3.2 oz) 83.9 kg (185 lb) 84.5 kg (186 lb 4.8 oz)    History of present illness:  GisellePeguesis a51 y.o.female,w hypertension, CHF ( EF 30% ), nonischemic cardiomyopathy, (cardiac cath 12/15/2015=>nl coronaries, Rozann Lesches ) w temp defibrillator for 25month. C/o polyuria x2 days and dry mouth and blurred vision, Pt presented due to thought that she was having allergic reaction to her medication. She was found to have severe hyperglycemia, hemoglobin of 651 with mild acute kidney injury,and glucosuria  Hospital Course:   Hyperglycemia/new onset type 2 diabetes -admitted with Severe hyperglycemia, CBG 650 with dehydration, polyuria, diagnosed newly with Type 2 Diabetes -started Lantus, glipizide -insulin teaching completed, she has problems with vision and hence discharged on Lantus Pen -hemoglobin A1c is 11.5 -Diabetes coordinator consulted and dietitian consult appreciated -seen by  case management and made FU with [facility] for new PCP next week and medication assistance match letter provided  Pharyngitis -was symptomatic, has Submand adenopathy -Improving with amoxicillin, continue for 270mo days at  discharge  Dehydration/mild acute kidney injury -Due to osmotic diuresis from severe hyperglycemia -improved  History of Chronic systolic CHF/nonischemic cardiomyopathy EF of 30% -History of normal coronaries on cardiac cath 12/2015 -Continue Coreg, losartan - 2-D echocardiogram with preserved EF -Clinically euvolemic, resume lasix 2084maily at discharge  Dyslipidemia -Continue Lipitor    Discharge Exam: Vitals:   07/09/17 2200 07/10/17 0639  BP: 110/84 116/82  Pulse: 91 89  Resp: 17 17  Temp: 98.4 F (36.9 C) 98 F (36.7 C)  SpO2: 98% 98%    General: AAOx3 Cardiovascular: S1S2/RRR Respiratory: CTAB  Discharge Instructions   Discharge Instructions    Ambulatory referral to Nutrition and Diabetic Education   Complete by:  As directed    New onset DM- A1C pending   Diet - low sodium heart healthy   Complete by:  As directed    Increase activity slowly   Complete by:  As directed      Allergies as of 07/10/2017   No Known Allergies     Medication List    STOP taking these medications   spironolactone 25 MG tablet Commonly known as:  ALDACTONE     TAKE these medications   amoxicillin 250 MG capsule Commonly known as:  AMOXIL Take 1 capsule (250 mg total) by mouth every 8 (eight) hours. For 2days   aspirin 81 MG chewable tablet Chew 81 mg by mouth daily.   atorvastatin 40 MG tablet Commonly known as:  LIPITOR Take 40 mg by mouth daily.   blood glucose meter kit and supplies Kit Dispense based on patient and insurance preference. Use up to four times daily as directed. (FOR ICD-9 250.00, 250.01).   CALCIUM PO Take 1  tablet by mouth daily.   carvedilol 6.25 MG tablet Commonly known as:  COREG Take 1 tablet (6.25 mg total) by mouth 2 (two) times daily with a meal.   furosemide 40 MG tablet Commonly known as:  LASIX Take 0.5 tablets (20 mg total) by mouth daily.   glimepiride 2 MG tablet Commonly known as:  AMARYL Take 1 tablet (2 mg  total) by mouth daily with breakfast. Start taking on:  07/11/2017   Insulin Glargine 100 UNIT/ML Solostar Pen Commonly known as:  LANTUS Inject 45 Units into the skin every morning.   losartan 25 MG tablet Commonly known as:  COZAAR Take 1 tablet (25 mg total) by mouth daily.      No Known Allergies Follow-up Information    [facility]. Go on 07/20/2017.   Why:  8:30 am with Zettie Pho NP, hospital post follow up appointment Contact information: [place] [address] [phone]           The results of significant diagnostics from this hospitalization (including imaging, microbiology, ancillary and laboratory) are listed below for reference.    Significant Diagnostic Studies: Dg Chest 2 View  Result Date: 07/06/2017 CLINICAL DATA:  Frequent urination, dry mouth, some dizziness, sharp chest pain, smoking history EXAM: CHEST - 2 VIEW COMPARISON:  Chest x-ray of 02/04/2017 FINDINGS: No active infiltrate or effusion is seen. Mild biapical pleural thickening is present. Mediastinal and hilar contours are unremarkable. The heart is within normal limits in size. No acute bony abnormality is seen. IMPRESSION: No active cardiopulmonary disease. Electronically Signed   By: Ivar Drape M.D.   On: 07/06/2017 17:05    Microbiology: No results found for this or any previous visit (from the past 240 hour(s)).   Labs: Basic Metabolic Panel: Recent Labs  Lab 07/06/17 1641 07/07/17 0507 07/08/17 0512 07/09/17 0701  NA 130* 137 134* 134*  K 4.3 3.2* 3.8 3.9  CL 92* 103 104 103  CO2 24 22 20* 22  GLUCOSE 651* 236* 331* 277*  BUN 21* _0 CREATININE 1.22* 0.62 0.58 0.60  CALCIUM 9.6 8.4* 8.3* 8.6*   Liver Function Tests: Recent Labs  Lab 07/07/17 0507  AST 15  ALT 19  ALKPHOS 79  BILITOT 0.6  PROT 6.4*  ALBUMIN 3.6   No results for input(s): LIPASE, AMYLASE in the last 168 hours. No results for  input(s): AMMONIA in the last 168 hours. CBC: Recent Labs  Lab 07/06/17 1641 07/07/17 0507 07/08/17 0512 07/09/17 0701  WBC 7.1 4.4 4.6 4.4  HGB 14.2 12.5 11.8* 12.0  HCT 41.6 38.0 35.7* 36.4  MCV 83.0 84.3 84.8 83.9  PLT 238 193 180 173   Cardiac Enzymes: Recent Labs  Lab 07/06/17 2308 07/07/17 0507  TROPONINI <0.03 <0.03   BNP: BNP (last 3 results) Recent Labs    10/05/16 2331 02/06/17 1525  BNP 10.3 16.9    ProBNP (last 3 results) No results for input(s): PROBNP in the last 8760 hours.  CBG: Recent Labs  Lab 07/09/17 1239 07/09/17 1804 07/09/17 2158 07/10/17 0743 07/10/17 1141  GLUCAP 248* 242* 173* 251* 249*       Signed:  Domenic Polite MD.  [facility] 07/10/2017, 3:13 PM

## 2017-07-14 MED FILL — TRUE METRIX TEST STRIP: 25 days supply | Qty: 100 | Fill #0

## 2017-07-15 ENCOUNTER — Encounter: Payer: Self-pay | Admitting: Emergency Medicine

## 2017-07-15 ENCOUNTER — Emergency Department
Admission: EM | Admit: 2017-07-15 | Discharge: 2017-07-15 | Disposition: A | Discharge status: Home / Self Care | Payer: Self-pay | Attending: Emergency Medicine | Admitting: Emergency Medicine

## 2017-07-15 ENCOUNTER — Other Ambulatory Visit: Payer: Self-pay

## 2017-07-15 ENCOUNTER — Emergency Department: Payer: Self-pay

## 2017-07-15 DIAGNOSIS — Z5321 Procedure and treatment not carried out due to patient leaving prior to being seen by health care provider: Secondary | ICD-10-CM | POA: Insufficient documentation

## 2017-07-15 DIAGNOSIS — M79672 Pain in left foot: Secondary | ICD-10-CM | POA: Insufficient documentation

## 2017-07-15 HISTORY — DX: Type 2 diabetes mellitus without complications: E11.9

## 2017-07-15 NOTE — ED Notes (Signed)
Pt stepped out of the ER lobby, room came avail, RN went outside to advise pt room was avail and pt decided to stay.

## 2017-07-15 NOTE — ED Notes (Addendum)
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 Pt to stay to review results with physician pt reports she needs to go

## 2017-07-15 NOTE — ED Notes (Signed)
Pt not in room at this time when RN went in for initial assessment.

## 2017-07-15 NOTE — ED Notes (Signed)
Pt not in room at this time. Pt to be disposed as LWBS.

## 2017-07-15 NOTE — ED Triage Notes (Signed)
Pt arrives ambulatory to triage with c/o left foot injury. Pt reports that she accidentally kicked her couch earlier around 1300. Pt is Type II diabetic and concerned about possible infection. Pt has not break in skin at this time and slight redness at site. Pt is in NAD.

## 2017-07-17 ENCOUNTER — Telehealth: Payer: Self-pay | Admitting: Emergency Medicine

## 2017-07-17 NOTE — Telephone Encounter (Signed)
Called patient due to lwot to inquire about condition and follow up plans. She says her foot is much improved and she is able to walk on it.

## 2017-07-20 ENCOUNTER — Ambulatory Visit: Payer: Self-pay | Attending: Internal Medicine | Admitting: Physician Assistant

## 2017-07-20 ENCOUNTER — Telehealth: Payer: Self-pay | Admitting: General Practice

## 2017-07-20 VITALS — BP 100/67 | HR 106 | Temp 98.0°F | Resp 18 | Ht 69.0 in | Wt 187.0 lb

## 2017-07-20 DIAGNOSIS — Z7982 Long term (current) use of aspirin: Secondary | ICD-10-CM | POA: Insufficient documentation

## 2017-07-20 DIAGNOSIS — I428 Other cardiomyopathies: Secondary | ICD-10-CM

## 2017-07-20 DIAGNOSIS — I110 Hypertensive heart disease with heart failure: Secondary | ICD-10-CM | POA: Insufficient documentation

## 2017-07-20 DIAGNOSIS — F17200 Nicotine dependence, unspecified, uncomplicated: Secondary | ICD-10-CM

## 2017-07-20 DIAGNOSIS — M797 Fibromyalgia: Secondary | ICD-10-CM | POA: Insufficient documentation

## 2017-07-20 DIAGNOSIS — E785 Hyperlipidemia, unspecified: Secondary | ICD-10-CM | POA: Insufficient documentation

## 2017-07-20 DIAGNOSIS — Z794 Long term (current) use of insulin: Secondary | ICD-10-CM

## 2017-07-20 DIAGNOSIS — E119 Type 2 diabetes mellitus without complications: Secondary | ICD-10-CM | POA: Insufficient documentation

## 2017-07-20 DIAGNOSIS — H538 Other visual disturbances: Secondary | ICD-10-CM

## 2017-07-20 DIAGNOSIS — Z79899 Other long term (current) drug therapy: Secondary | ICD-10-CM | POA: Insufficient documentation

## 2017-07-20 DIAGNOSIS — E118 Type 2 diabetes mellitus with unspecified complications: Secondary | ICD-10-CM

## 2017-07-20 DIAGNOSIS — I5022 Chronic systolic (congestive) heart failure: Secondary | ICD-10-CM

## 2017-07-20 LAB — GLUCOSE, POCT (MANUAL RESULT ENTRY): POC GLUCOSE: 106 mg/dL — AB (ref 70–99)

## 2017-07-20 MED ORDER — GLIMEPIRIDE 2 MG PO TABS: 2.0000 mg | ORAL_TABLET | Freq: Every day | ORAL | 2 refills | Status: DC

## 2017-07-20 NOTE — Telephone Encounter (Signed)
Patient was 20+ mins late to her 8:30 appointment and the doctor was unable  to see patient due to our late/no show policy and a double book filled slot at 9:00. Patient became very upset at the fact that she could not be seen. Patient complained of her vision being blurry and she not being able to see while driving. Patient states she was going to call [facility] and took all of the front office staff names and the provider's name. She then asked for her supervisors number. Gaetano Net, Chartered certified accountant, spoke with patient and called a clinic in [place] where patient lives to try and get her to be seen today. Patient was able to be seen by provider once a no show occurred at one of the 9:00am slots. Patient will also keep PCP appointment in [place].

## 2017-07-20 NOTE — Patient Instructions (Signed)
Aim for 30 minutes of exercise most days. Rethink what you drink. Water is great! Aim for 2-3 Carb Choices per meal (30-45 grams) +/- 1 either way  Aim for 0-15 Carbs per snack if hungry  Include protein in moderation with your meals and snacks  Consider reading food labels for Total Carbohydrate and Fat Grams of foods  Consider checking BG at alternate times per day  Continue taking medication as directed Be mindful about how much sugar you are adding to beverages and other foods. Fruit Punch - find one with no sugar  Measure and decrease portions of carbohydrate foods  Make your plate and don't go back for seconds  CALM DOWN, u r going to be ok! NO SMOKING :-)     CHILLLL

## 2017-07-20 NOTE — Progress Notes (Signed)
Robin Pace  ZOX:[id number]  WJX:[id number]  DOB - November 15, 1965  Chief Complaint  Patient presents with  . Hospitalization Follow-up    new DM       Subjective:   Robin Pace is a 52 y.o. female here today for establishment of care. She has a past medical history of nonischemic cardiomyopathy, chronic systolic congestive heart failure, hypertension, normal coronary arteries by cardiac catheterization in 2017, hyperlipidemia and smoking. She has lost her medical insurance coverage.   For 2-3 months she's had intermittent episodes of increasing weakness, fatigue, polydipsia and poluuria. She thought this is was related to some blood pressure medications. She presented to the emergency department on 07/06/2017 with the above symptoms. Her emergency department workup showed an x-ray that was normal. Sodium 1:30. Glucose 651. Creatinine 1.2. Ketones 23 troponins negative. She had no prior history of diabetes mellitus.  She was admitted to the internal medicine team. She was gently hydrated. Her hemoglobin A1c was greater than 11. She was started on Lantus 45 units plus Amaryl 2 mg daily. At discharge her blood sugar was 277. Creatinine improved to 0.6. Sodium improved to 134. Hospital course complicated by some mild dehydration improved with fluids and pharyngitis treated with antibiotics.   Today she complains of continued blurred vision. Her blood sugars have improved. She is checking her sugar 3 times daily. She is compliant with her insulin regimen. She did not bring a log today. She is trying to be better with her diet. Unfortunately she continues to smoke some.   ROS: GEN: denies fever or chills, denies change in weight Skin: denies lesions or rashes HEENT: denies headache, earache, epistaxis, sore throat, or neck pain; +blurred vision LUNGS: denies SHOB, dyspnea, PND, orthopnea CV: denies CP or palpitations ABD: denies abd pain, N or V EXT: denies muscle spasms or swelling; no  pain in lower ext, no weakness NEURO: denies numbness or tingling, denies sz, stroke or TIA   ALLERGIES: No Known Allergies  PAST MEDICAL HISTORY: Past Medical History:  Diagnosis Date  . Abdominal hernia   . Anemia   . CHF (congestive heart failure) ([place])   . Diabetes mellitus without complication ([place])   . Fibromyalgia   . Fibromyalgia   . Headache   . Hypertension   . Numbness on right side   . Wears dentures    top    PAST SURGICAL HISTORY: Past Surgical History:  Procedure Laterality Date  . 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 CESAREAN SECTION    . HERNIA REPAIR    . INCISE AND DRAIN ABCESS  2005   rt middle finger   . MASS EXCISION Right 08/06/2012   Procedure: EXCISION OF LARGE MASS right FLANK WITH LIPO ASSISTANCE;  Surgeon: Cristine Polio, MD;  Location: [facility];  Service: Plastics;  Laterality: Right;  . TUBAL LIGATION      MEDICATIONS AT HOME: Prior to Admission medications   Medication Sig Start Date End Date Taking? Authorizing Provider  aspirin 81 MG chewable tablet Chew 81 mg by mouth daily. 11/26/15  Yes [provider]  atorvastatin (LIPITOR) 40 MG tablet Take 40 mg by mouth daily.   Yes [provider]  blood glucose meter kit and supplies KIT Dispense based on patient and insurance preference. Use up to four times daily as directed. (FOR ICD-9  250.00, 250.01). 07/10/17  Yes Domenic Polite, MD  CALCIUM PO Take 1 tablet by mouth daily.   Yes [provider]  carvedilol (COREG) 6.25 MG tablet Take 1 tablet (6.25 mg total) by mouth 2 (two) times daily with a meal. 07/10/17  Yes Domenic Polite, MD  furosemide (LASIX) 40 MG tablet Take 0.5 tablets (20 mg total) by mouth daily. 07/10/17  Yes Domenic Polite, MD  glimepiride (AMARYL) 2 MG  tablet Take 1 tablet (2 mg total) by mouth daily with breakfast. 08/09/17  Yes Ena Dawley, Markham Dumlao S, PA-C  Insulin Glargine (LANTUS) 100 UNIT/ML Solostar Pen Inject 45 Units into the skin every morning. 07/10/17  Yes Domenic Polite, MD  losartan (COZAAR) 25 MG tablet Take 1 tablet (25 mg total) by mouth daily. 07/10/17  Yes Domenic Polite, MD    Family History  Problem Relation Age of Onset  . Hypertension Mother   . Diabetes Mother   . Heart disease Father   . Heart disease Brother    Social-unmarried, working on disability, smokes  Objective:   Vitals:   07/20/17 0933  BP: 100/67  Pulse: (!) 106  Resp: 18  Temp: 98 F (36.7 C)  TempSrc: Oral  SpO2: 99%  Weight: 187 lb (84.8 kg)  Height: 5' 9"  (1.753 m)    Exam General appearance : Awake, alert, not in any distress. Speech Clear. Not toxic looking HEENT: Atraumatic and Normocephalic, pupils equally reactive to light and accomodation Neck: supple, no JVD. No cervical lymphadenopathy.  Chest:Good air entry bilaterally, no added sounds  CVS: S1 S2 regular, no murmurs.  Abdomen: Bowel sounds present, Non tender and not distended with no guarding, rigidity or rebound. Extremities: B/L Lower Ext shows no edema, both legs are warm to touch Neurology: Awake alert, and oriented X 3, CN II-XII intact, Non focal Skin:No Rash Wounds:N/A  Data Review Lab Results  Component Value Date   HGBA1C 11.5 (H) 07/07/2017     Assessment & Plan  1. New Onset DM 2, insulin requiring  -Cont Lantus Pen 45U daily with Amaryl 2 mg daily  -low carbs, diabetes teaching  -cont to keep a log and bring to next visit  2. Smoker  -Cessation discussed and I believe that she is Ready 2  Quit   -no assistance needed  3. HTN-controlled  -cont current regimen  4. Chronic Systolic HF/HFrEF (54%)  -cont GDMT  -low sodium diet  5. Blurred Vision  opthalmology appt  appt with financial counselor  Return in about 2 weeks (around 08/03/2017).  The  patient was given clear instructions to go to ER or return to medical center if symptoms don't improve, worsen or new problems develop. The patient verbalized understanding. The patient was told to call to get lab results if they haven't heard anything in the next week.   Total time spent with patient was 35 min. Greater than 50 % of this visit was spent face to face counseling and coordinating care regarding risk factor modification, compliance importance and encouragement, education related to hospitalization review.  This note has been created with Surveyor, quantity. Any transcriptional errors are unintentional.    Zettie Pho, PA-C [facility] and [facility], [place]   07/20/2017, 9:56 AM

## 2017-07-31 ENCOUNTER — Telehealth: Payer: Self-pay | Admitting: General Practice

## 2017-07-31 NOTE — Telephone Encounter (Signed)
Patient called and stated that she needed listed medication refilled. Patient stated it was talked about in last visit. Please send listed medications to [facility] pharmacy.   atorvastatin (LIPITOR) 40 MG tablet [[id number]] carvedilol (COREG) 6.25 MG tablet [[id number]]  furosemide (LASIX) 40 MG tablet [[id number]]  glimepiride (AMARYL) 2 MG tablet [[id number] Insulin Glargine (LANTUS) 100 UNIT/ML Solostar Pen [[id number]]  losartan (COZAAR) 25 MG tablet [[id number]] Please fu at your earliest convenience.

## 2017-08-03 ENCOUNTER — Ambulatory Visit: Payer: Self-pay | Attending: Internal Medicine | Admitting: Physician Assistant

## 2017-08-03 VITALS — BP 114/78 | HR 82 | Temp 98.1°F | Resp 16 | Ht 69.0 in | Wt 192.0 lb

## 2017-08-03 DIAGNOSIS — I110 Hypertensive heart disease with heart failure: Secondary | ICD-10-CM | POA: Insufficient documentation

## 2017-08-03 DIAGNOSIS — Z9889 Other specified postprocedural states: Secondary | ICD-10-CM | POA: Insufficient documentation

## 2017-08-03 DIAGNOSIS — I10 Essential (primary) hypertension: Secondary | ICD-10-CM

## 2017-08-03 DIAGNOSIS — I509 Heart failure, unspecified: Secondary | ICD-10-CM | POA: Insufficient documentation

## 2017-08-03 DIAGNOSIS — Z7982 Long term (current) use of aspirin: Secondary | ICD-10-CM | POA: Insufficient documentation

## 2017-08-03 DIAGNOSIS — E119 Type 2 diabetes mellitus without complications: Secondary | ICD-10-CM | POA: Insufficient documentation

## 2017-08-03 DIAGNOSIS — Z794 Long term (current) use of insulin: Secondary | ICD-10-CM | POA: Insufficient documentation

## 2017-08-03 DIAGNOSIS — E785 Hyperlipidemia, unspecified: Secondary | ICD-10-CM | POA: Insufficient documentation

## 2017-08-03 LAB — GLUCOSE, POCT (MANUAL RESULT ENTRY): POC Glucose: 100 mg/dl — AB (ref 70–99)

## 2017-08-03 MED ORDER — LOSARTAN POTASSIUM 25 MG PO TABS: 25.0000 mg | ORAL_TABLET | Freq: Every day | ORAL | 3 refills | Status: DC

## 2017-08-03 MED ORDER — CARVEDILOL 6.25 MG PO TABS: 6.2500 mg | ORAL_TABLET | Freq: Two times a day (BID) | ORAL | 3 refills | Status: DC

## 2017-08-03 MED ORDER — GLIMEPIRIDE 2 MG PO TABS: 2.0000 mg | ORAL_TABLET | Freq: Every day | ORAL | 2 refills | Status: DC

## 2017-08-03 MED ORDER — TRUEPLUS LANCETS 26G MISC: 0 refills | Status: DC

## 2017-08-03 MED ORDER — GLUCOSE BLOOD VI STRP: ORAL_STRIP | 12 refills | Status: DC

## 2017-08-03 MED ORDER — ATORVASTATIN CALCIUM 40 MG PO TABS: 40.0000 mg | ORAL_TABLET | Freq: Every day | ORAL | 3 refills | Status: DC

## 2017-08-03 MED ORDER — FUROSEMIDE 40 MG PO TABS: 20.0000 mg | ORAL_TABLET | Freq: Every day | ORAL | 3 refills | Status: DC

## 2017-08-03 MED ORDER — INSULIN GLARGINE 100 UNIT/ML SOLOSTAR PEN: 45.0000 [IU] | PEN_INJECTOR | Freq: Every morning | SUBCUTANEOUS | 2 refills | Status: DC

## 2017-08-03 MED FILL — CARVEDILOL 6.25 MG TABLET: 6.25 | 30 days supply | Qty: 60 | Fill #0

## 2017-08-03 MED FILL — TRUEplus LANCETS 28G MISC: 30 days supply | Qty: 100 | Fill #0

## 2017-08-03 MED FILL — LANTUS SOLOSTAR 100 UNITS/M: 100 | 26 days supply | Qty: 12 | Fill #0

## 2017-08-03 MED FILL — LOSARTAN POTASSIUM 25 MG TA: 25 | 30 days supply | Qty: 30 | Fill #0

## 2017-08-03 MED FILL — GLIMEPIRIDE 2 MG TABLET: 2 | 30 days supply | Qty: 30 | Fill #0

## 2017-08-03 MED FILL — FUROSEMIDE 40 MG TAB: 40 | 30 days supply | Qty: 15 | Fill #0

## 2017-08-03 MED FILL — TRUE METRIX TEST STRIP: 30 days supply | Qty: 100 | Fill #0

## 2017-08-03 MED FILL — ATORVASTATIN CALCIUM 40 MG: 40 | 30 days supply | Qty: 30 | Fill #0

## 2017-08-03 NOTE — Progress Notes (Signed)
Patient ID: Robin Pace, female   DOB: October 08, 1965, 52 y.o.   MRN: [id number]   Levora Werden, is a 52 y.o. female  TML:[id number]  [id number]  DOB - 04-21-1966  Subjective:  Chief Complaint and HPI: Robin Pace is a 52 y.o. female here today for diabetes recheck.  Blood sugars doing great-running bt 80-140.  Vision is improving.  She feels good overall.  Needs RF of meds.     ROS:   Constitutional:  No f/c, No night sweats, No unexplained weight loss. EENT:  No vision changes, No blurry vision, No hearing changes. No mouth, throat, or ear problems.  Respiratory: No cough, No SOB Cardiac: No CP, no palpitations GI:  No abd pain, No N/V/D. GU: No Urinary s/sx Musculoskeletal: No joint pain Neuro: No headache, no dizziness, no motor weakness.  Skin: No rash Endocrine:  No polydipsia. No polyuria.  Psych: Denies SI/HI  No problems updated.  ALLERGIES: No Known Allergies  PAST MEDICAL HISTORY: Past Medical History:  Diagnosis Date  . Abdominal hernia   . Anemia   . CHF (congestive heart failure) ([place])   . Diabetes mellitus without complication ([place])   . Fibromyalgia   . Fibromyalgia   . Headache   . Hypertension   . Numbness on right side   . Wears dentures    top    MEDICATIONS AT HOME: Prior to Admission medications   Medication Sig Start Date End Date Taking? Authorizing Provider  aspirin 81 MG chewable tablet Chew 81 mg by mouth daily. 11/26/15  Yes [provider]  atorvastatin (LIPITOR) 40 MG tablet Take 1 tablet (40 mg total) by mouth daily. 08/03/17  Yes Freeman Caldron M, PA-C  blood glucose meter kit and supplies KIT Dispense based on patient and insurance preference. Use up to four times daily as directed. (FOR ICD-9 250.00, 250.01). 07/10/17  Yes Domenic Polite, MD  CALCIUM PO Take 1 tablet by mouth daily.   Yes [provider]  carvedilol (COREG) 6.25 MG tablet Take 1 tablet (6.25 mg total) by mouth 2 (two) times daily with a meal.  08/03/17  Yes Piotr Christopher M, PA-C  furosemide (LASIX) 40 MG tablet Take 0.5 tablets (20 mg total) by mouth daily. 08/03/17  Yes Freeman Caldron M, PA-C  glimepiride (AMARYL) 2 MG tablet Take 1 tablet (2 mg total) by mouth daily with breakfast. 08/09/17  Yes Channelle Bottger M, PA-C  Insulin Glargine (LANTUS) 100 UNIT/ML Solostar Pen Inject 45 Units into the skin every morning. 08/03/17  Yes Freeman Caldron M, PA-C  losartan (COZAAR) 25 MG tablet Take 1 tablet (25 mg total) by mouth daily. 08/03/17  Yes Freeman Caldron M, PA-C  glucose blood (TRUE METRIX BLOOD GLUCOSE TEST) test strip Use as instructed 08/03/17   Argentina Donovan, PA-C  TRUEPLUS LANCETS 26G MISC Check blood sugar 3 times daily 08/03/17   Argentina Donovan, PA-C     Objective:  EXAM:   Vitals:   08/03/17 1622  BP: 114/78  Pulse: 82  Resp: 16  Temp: 98.1 F (36.7 C)  TempSrc: Oral  SpO2: 96%  Weight: 192 lb (87.1 kg)  Height: _0  (1.753 m)    General appearance : A&OX3. NAD. Non-toxic-appearing HEENT: Atraumatic and Normocephalic.  PERRLA. EOM intact.  Neck: supple, no JVD. No cervical lymphadenopathy. No thyromegaly Chest/Lungs:  Breathing-non-labored, Good air entry bilaterally, breath sounds normal without rales, rhonchi, or wheezing  CVS: S1 S2 regular, no murmurs, gallops, rubs  Extremities: Bilateral Lower Ext  shows no edema, both legs are warm to touch with = pulse throughout Neurology:  CN II-XII grossly intact, Non focal.   Psych:  TP linear. J/I WNL. Normal speech. Appropriate eye contact and affect.  Skin:  No Rash  Data Review Lab Results  Component Value Date   HGBA1C 11.5 (H) 07/07/2017     Assessment & Plan   1. Diabetes mellitus without complication (HCC) Much improving - Glucose (CBG) - Insulin Glargine (LANTUS) 100 UNIT/ML Solostar Pen; Inject 45 Units into the skin every morning.  Dispense: 15 mL; Refill: 2 - glimepiride (AMARYL) 2 MG tablet; Take 1 tablet (2 mg total) by mouth daily with  breakfast.  Dispense: 30 tablet; Refill: 2 - glucose blood (TRUE METRIX BLOOD GLUCOSE TEST) test strip; Use as instructed  Dispense: 100 each; Refill: 12 - TRUEPLUS LANCETS 26G MISC; Check blood sugar 3 times daily  Dispense: 100 each; Refill: 0  2. Hyperlipidemia, unspecified hyperlipidemia type - atorvastatin (LIPITOR) 40 MG tablet; Take 1 tablet (40 mg total) by mouth daily.  Dispense: 30 tablet; Refill: 3  3. Hypertension, unspecified type At goal - carvedilol (COREG) 6.25 MG tablet; Take 1 tablet (6.25 mg total) by mouth 2 (two) times daily with a meal.  Dispense: 60 tablet; Refill: 3 - losartan (COZAAR) 25 MG tablet; Take 1 tablet (25 mg total) by mouth daily.  Dispense: 30 tablet; Refill: 3 - furosemide (LASIX) 40 MG tablet; Take 0.5 tablets (20 mg total) by mouth daily.  Dispense: 30 tablet; Refill: 3     Patient have been counseled extensively about nutrition and exercise  Return for keep appt to establish care in May with Zelda.  The patient was given clear instructions to go to ER or return to medical center if symptoms don't improve, worsen or new problems develop. The patient verbalized understanding. The patient was told to call to get lab results if they haven't heard anything in the next week.     Freeman Caldron, PA-C [facility] and [place], [place]   08/03/2017, 4:39 PM

## 2017-08-03 NOTE — Progress Notes (Signed)
Pt. Is here for a diabetes follow-up. Patient need medication.

## 2017-08-08 ENCOUNTER — Other Ambulatory Visit: Payer: Self-pay | Admitting: Nurse Practitioner

## 2017-08-08 NOTE — Telephone Encounter (Signed)
Medications were sent on 08-03-2017

## 2017-08-11 ENCOUNTER — Ambulatory Visit: Payer: Self-pay

## 2017-09-05 ENCOUNTER — Encounter: Payer: Self-pay | Admitting: Nurse Practitioner

## 2017-09-05 ENCOUNTER — Ambulatory Visit: Payer: Self-pay | Attending: Nurse Practitioner | Admitting: Nurse Practitioner

## 2017-09-05 VITALS — BP 137/92 | HR 87 | Temp 98.1°F | Ht 69.0 in | Wt 193.2 lb

## 2017-09-05 DIAGNOSIS — J029 Acute pharyngitis, unspecified: Secondary | ICD-10-CM | POA: Insufficient documentation

## 2017-09-05 DIAGNOSIS — M797 Fibromyalgia: Secondary | ICD-10-CM | POA: Insufficient documentation

## 2017-09-05 DIAGNOSIS — E119 Type 2 diabetes mellitus without complications: Secondary | ICD-10-CM | POA: Insufficient documentation

## 2017-09-05 DIAGNOSIS — E785 Hyperlipidemia, unspecified: Secondary | ICD-10-CM | POA: Insufficient documentation

## 2017-09-05 DIAGNOSIS — Z7982 Long term (current) use of aspirin: Secondary | ICD-10-CM | POA: Insufficient documentation

## 2017-09-05 DIAGNOSIS — Z794 Long term (current) use of insulin: Secondary | ICD-10-CM | POA: Insufficient documentation

## 2017-09-05 DIAGNOSIS — I110 Hypertensive heart disease with heart failure: Secondary | ICD-10-CM | POA: Insufficient documentation

## 2017-09-05 DIAGNOSIS — I509 Heart failure, unspecified: Secondary | ICD-10-CM | POA: Insufficient documentation

## 2017-09-05 DIAGNOSIS — I420 Dilated cardiomyopathy: Secondary | ICD-10-CM

## 2017-09-05 DIAGNOSIS — I10 Essential (primary) hypertension: Secondary | ICD-10-CM

## 2017-09-05 DIAGNOSIS — Z79899 Other long term (current) drug therapy: Secondary | ICD-10-CM | POA: Insufficient documentation

## 2017-09-05 LAB — GLUCOSE, POCT (MANUAL RESULT ENTRY): POC GLUCOSE: 99 mg/dL (ref 70–99)

## 2017-09-05 MED ORDER — GLIMEPIRIDE 2 MG PO TABS: 2.0000 mg | ORAL_TABLET | Freq: Every day | ORAL | 2 refills | Status: DC

## 2017-09-05 MED ORDER — LOSARTAN POTASSIUM 25 MG PO TABS: 25.0000 mg | ORAL_TABLET | Freq: Every day | ORAL | 3 refills | Status: AC

## 2017-09-05 MED ORDER — INSULIN PEN NEEDLE 31G X 5 MM MISC: 1 refills | Status: DC

## 2017-09-05 MED ORDER — INSULIN PEN NEEDLE 31G X 5 MM MISC
1 refills | Status: DC
Start: 2017-09-05 — End: 2018-04-16

## 2017-09-05 MED ORDER — GLUCOSE BLOOD VI STRP: ORAL_STRIP | 12 refills | Status: AC

## 2017-09-05 MED ORDER — FUROSEMIDE 40 MG PO TABS: 20.0000 mg | ORAL_TABLET | Freq: Every day | ORAL | 3 refills | Status: DC

## 2017-09-05 MED ORDER — TRUEPLUS LANCETS 28G MISC: 3 refills | Status: AC

## 2017-09-05 MED ORDER — INSULIN GLARGINE 100 UNIT/ML SOLOSTAR PEN: 45.0000 [IU] | PEN_INJECTOR | Freq: Every morning | SUBCUTANEOUS | 2 refills | Status: DC

## 2017-09-05 MED ORDER — ATORVASTATIN CALCIUM 40 MG PO TABS: 40.0000 mg | ORAL_TABLET | Freq: Every day | ORAL | 3 refills | Status: DC

## 2017-09-05 MED ORDER — TRUEPLUS LANCETS 26G MISC
0 refills | Status: DC
Start: 2017-09-05 — End: 2017-09-05

## 2017-09-05 MED ORDER — CARVEDILOL 6.25 MG PO TABS: 6.2500 mg | ORAL_TABLET | Freq: Two times a day (BID) | ORAL | 3 refills | Status: DC

## 2017-09-05 NOTE — Patient Instructions (Addendum)
Pharyngitis Pharyngitis is redness, pain, and swelling (inflammation) of the throat (pharynx). It is a very common cause of sore throat. Pharyngitis can be caused by a bacteria, but it is usually caused by a virus. Most cases of pharyngitis get better on their own without treatment. What are the causes? This condition may be caused by:  Infection by viruses (viral). Viral pharyngitis spreads from person to person (is contagious) through coughing, sneezing, and sharing of personal items or utensils such as cups, forks, spoons, and toothbrushes.  Infection by bacteria (bacterial). Bacterial pharyngitis may be spread by touching the nose or face after coming in contact with the bacteria, or through more intimate contact, such as kissing.  Allergies. Allergies can cause buildup of mucus in the throat (post-nasal drip), leading to inflammation and irritation. Allergies can also cause blocked nasal passages, forcing breathing through the mouth, which dries and irritates the throat.  What increases the risk? You are more likely to develop this condition if:  You are 5-24 years old.  You are exposed to crowded environments such as daycare, school, or dormitory living.  You live in a cold climate.  You have a weakened disease-fighting (immune) system.  What are the signs or symptoms? Symptoms of this condition vary by the cause (viral, bacterial, or allergies) and can include:  Sore throat.  Fatigue.  Low-grade fever.  Headache.  Joint pain and muscle aches.  Skin rashes.  Swollen glands in the throat (lymph nodes).  Plaque-like film on the throat or tonsils. This is often a symptom of bacterial pharyngitis.  Vomiting.  Stuffy nose (nasal congestion).  Cough.  Red, itchy eyes (conjunctivitis).  Loss of appetite.  How is this diagnosed? This condition is often diagnosed based on your medical history and a physical exam. Your health care provider will ask you questions about  your illness and your symptoms. A swab of your throat may be done to check for bacteria (rapid strep test). Other lab tests may also be done, depending on the suspected cause, but these are rare. How is this treated? This condition usually gets better in 3-4 days without medicine. Bacterial pharyngitis may be treated with antibiotic medicines. Follow these instructions at home:  Take over-the-counter and prescription medicines only as told by your health care provider. ? If you were prescribed an antibiotic medicine, take it as told by your health care provider. Do not stop taking the antibiotic even if you start to feel better. ? Do not give children aspirin because of the association with Reye syndrome.  Drink enough water and fluids to keep your urine clear or pale yellow.  Get a lot of rest.  Gargle with a salt-water mixture 3-4 times a day or as needed. To make a salt-water mixture, completely dissolve -1 tsp of salt in 1 cup of warm water.  If your health care provider approves, you may use throat lozenges or sprays to soothe your throat. Contact a health care provider if:  You have large, tender lumps in your neck.  You have a rash.  You cough up green, yellow-brown, or bloody spit. Get help right away if:  Your neck becomes stiff.  You drool or are unable to swallow liquids.  You cannot drink or take medicines without vomiting.  You have severe pain that does not go away, even after you take medicine.  You have trouble breathing, and it is not caused by a stuffy nose.  You have new pain and swelling in your joints   such as the knees, ankles, wrists, or elbows. Summary  Pharyngitis is redness, pain, and swelling (inflammation) of the throat (pharynx).  While pharyngitis can be caused by a bacteria, the most common causes are viral.  Most cases of pharyngitis get better on their own without treatment.  Bacterial pharyngitis is treated with antibiotic medicines. This  information is not intended to replace advice given to you by your health care provider. Make sure you discuss any questions you have with your health care provider. Document Released: 04/18/2005 Document Revised: 05/24/2016 Document Reviewed: 05/24/2016 Elsevier Interactive Patient Education  2018 [employer].  Sore Throat A sore throat is pain, burning, irritation, or scratchiness in the throat. When you have a sore throat, you may feel pain or tenderness in your throat when you swallow or talk. Many things can cause a sore throat, including:  An infection.  Seasonal allergies.  Dryness in the air.  Irritants, such as smoke or pollution.  Gastroesophageal reflux disease (GERD).  A tumor.  A sore throat is often the first sign of another sickness. It may happen with other symptoms, such as coughing, sneezing, fever, and swollen neck glands. Most sore throats go away without medical treatment. Follow these instructions at home:  Take over-the-counter medicines only as told by your health care provider.  Drink enough fluids to keep your urine clear or pale yellow.  Rest as needed.  To help with pain, try: ? Sipping warm liquids, such as broth, herbal tea, or warm water. ? Eating or drinking cold or frozen liquids, such as frozen ice pops. ? Gargling with a salt-water mixture 3-4 times a day or as needed. To make a salt-water mixture, completely dissolve -1 tsp of salt in 1 cup of warm water. ? Sucking on hard candy or throat lozenges. ? Putting a cool-mist humidifier in your bedroom at night to moisten the air. ? Sitting in the bathroom with the door closed for 5-10 minutes while you run hot water in the shower.  Do not use any tobacco products, such as cigarettes, chewing tobacco, and e-cigarettes. If you need help quitting, ask your health care provider. Contact a health care provider if:  You have a fever for more than 2-3 days.  You have symptoms that last (are  persistent) for more than 2-3 days.  Your throat does not get better within 7 days.  You have a fever and your symptoms suddenly get worse. Get help right away if:  You have difficulty breathing.  You cannot swallow fluids, soft foods, or your saliva.  You have increased swelling in your throat or neck.  You have persistent nausea and vomiting. This information is not intended to replace advice given to you by your health care provider. Make sure you discuss any questions you have with your health care provider. Document Released: 05/26/2004 Document Revised: 12/13/2015 Document Reviewed: 02/06/2015 Elsevier Interactive Patient Education  2018 [place] Risks of Smoking Smoking cigarettes is very bad for your health. Tobacco smoke has over 200 known poisons in it. It contains the poisonous gases nitrogen oxide and carbon monoxide. There are over 60 chemicals in tobacco smoke that cause cancer. Smoking is difficult to quit because a chemical in tobacco, called nicotine, causes addiction or dependence. When you smoke and inhale, nicotine is absorbed rapidly into the bloodstream through your lungs. Both inhaled and non-inhaled nicotine may be addictive. What are the risks of cigarette smoke? Cigarette smokers have an increased risk of many serious medical  problems, including:  Lung cancer.  Lung disease, such as pneumonia, bronchitis, and emphysema.  Chest pain (angina) and heart attack because the heart is not getting enough oxygen.  Heart disease and peripheral blood vessel disease.  High blood pressure (hypertension).  Stroke.  Oral cancer, including cancer of the lip, mouth, or voice box.  Bladder cancer.  Pancreatic cancer.  Cervical cancer.  Pregnancy complications, including premature birth.  Stillbirths and smaller newborn babies, birth defects, and genetic damage to sperm.  Early menopause.  Lower estrogen level for women.  Infertility.  Facial  wrinkles.  Blindness.  Increased risk of broken bones (fractures).  Senile dementia.  Stomach ulcers and internal bleeding.  Delayed wound healing and increased risk of complications during surgery.  Even smoking lightly shortens your life expectancy by several years.  Because of secondhand smoke exposure, children of smokers have an increased risk of the following:  Sudden infant death syndrome (SIDS).  Respiratory infections.  Lung cancer.  Heart disease.  Ear infections.  What are the benefits of quitting? There are many health benefits of quitting smoking. Here are some of them:  Within days of quitting smoking, your risk of having a heart attack decreases, your blood flow improves, and your lung capacity improves. Blood pressure, pulse rate, and breathing patterns start returning to normal soon after quitting.  Within months, your lungs may clear up completely.  Quitting for 10 years reduces your risk of developing lung cancer and heart disease to almost that of a nonsmoker.  People who quit may see an improvement in their overall quality of life.  How do I quit smoking? Smoking is an addiction with both physical and psychological effects, and longtime habits can be hard to change. Your health care provider can recommend:  Programs and community resources, which may include group support, education, or talk therapy.  Prescription medicines to help reduce cravings.  Nicotine replacement products, such as patches, gum, and nasal sprays. Use these products only as directed. Do not replace cigarette smoking with electronic cigarettes, which are commonly called e-cigarettes. The safety of e-cigarettes is not known, and some may contain harmful chemicals.  A combination of two or more of these methods.  Where to find more information:  American Lung Association: www.lung.org  American Cancer Society: www.cancer.org Summary  Smoking cigarettes is very bad for your  health. Cigarette smokers have an increased risk of many serious medical problems, including several cancers, heart disease, and stroke.  Smoking is an addiction with both physical and psychological effects, and longtime habits can be hard to change.  By stopping right away, you can greatly reduce the risk of medical problems for you and your family.  To help you quit smoking, your health care provider can recommend programs, community resources, prescription medicines, and nicotine replacement products such as patches, gum, and nasal sprays. This information is not intended to replace advice given to you by your health care provider. Make sure you discuss any questions you have with your health care provider. Document Released: 05/26/2004 Document Revised: 04/22/2016 Document Reviewed: 04/22/2016 Elsevier Interactive Patient Education  2017 [employer].

## 2017-09-05 NOTE — Progress Notes (Signed)
Assessment & Plan:  Aubrii was seen today for establish care.  Diagnoses and all orders for this visit:  Diabetes mellitus without complication (HCC) -     Glucose (CBG) -     glimepiride (AMARYL) 2 MG tablet; Take 1 tablet (2 mg total) by mouth daily with breakfast. -     glucose blood (TRUE METRIX BLOOD GLUCOSE TEST) test strip; Use as instructed -     Insulin Glargine (LANTUS) 100 UNIT/ML Solostar Pen; Inject 45 Units into the skin every morning. -     Discontinue: TRUEPLUS LANCETS 26G MISC; Check blood sugar 3 times daily -     Insulin Pen Needle (B-D UF III MINI PEN NEEDLES) 31G X 5 MM MISC; Use as instructed -     TRUEPLUS LANCETS 28G MISC; Use as instructed Continue blood sugar control as discussed in office today, low carbohydrate diet, and regular physical exercise as tolerated, 150 minutes per week (30 min each day, 5 days per week, or 50 min 3 days per week). Keep blood sugar logs with fasting goal of 80-130 mg/dl, post prandial less than 180.  For Hypoglycemia: BS <60 and Hyperglycemia BS >400; contact the clinic ASAP. Annual eye exams and foot exams are recommended.  Hyperlipidemia, unspecified hyperlipidemia type -     atorvastatin (LIPITOR) 40 MG tablet; Take 1 tablet (40 mg total) by mouth daily. INSTRUCTIONS: Work on a low fat, heart healthy diet and participate in regular aerobic exercise program by working out at least 150 minutes per week. No fried foods. No junk foods, sodas, sugary drinks, unhealthy snacking, alcohol or smoking.     Hypertension, unspecified type -     furosemide (LASIX) 40 MG tablet; Take 0.5 tablets (20 mg total) by mouth daily. -     carvedilol (COREG) 6.25 MG tablet; Take 1 tablet (6.25 mg total) by mouth 2 (two) times daily with a meal. -     losartan (COZAAR) 25 MG tablet; Take 1 tablet (25 mg total) by mouth daily.   Continue all antihypertensives as prescribed.  Remember to bring in your blood pressure log with you for your follow up  appointment.  DASH/Mediterranean Diets are healthier choices for HTN.   Pharyngitis, unspecified etiology -     Culture, Group A Strep   Cardiomyopathy Follow up with cardiology as instructed!!!  Patient has been counseled on age-appropriate routine health concerns for screening and prevention. These are reviewed and up-to-date. Referrals have been placed accordingly. Immunizations are up-to-date or declined.    Subjective:   Chief Complaint  Patient presents with  . Establish Care    Pt. is here to establish care for diabetes and hypertension.    HPI Robin Pace 52 y.o. female presents to office today to establish care. She has a history of hypertension, DM, HPL and has complaints today of chronic pharyngitis.   Essential Hypertension She did not take all of her blood pressure medication today. She does not check her blood pressure at home as she does not have a BP cuff. BP medications include losartan coreg 6.25 BID and Lasix 20 mg daily. Denies chest pain, shortness of breath, palpitations, lightheadedness, dizziness, headaches or BLE edema.  BP Readings from Last 3 Encounters:  09/05/17 (!) 137/92  08/03/17 114/78  07/20/17 100/67     Diabetes Mellitus Type 2 Chronic. Not well controlled. Checking blood sugars TID. She can not recall specific numbers but reports highs and low readings. Denies any hypo or hyperglycemic symptoms. She  is overdue for eye exam.  Lab Results  Component Value Date   HGBA1C 11.5 (H) 07/07/2017    Sore Throat Patient complains of sore throat. Symptoms began 1 year ago. Pain is of mild severity. Fever is absent. Other associated symptoms have included cervical lymphadenopathy.  Fluid intake is good.  There has not been contact with an individual with known strep.  Current medications include none.      Hyperlipidemia Patient presents for follow up to hyperlipidemia.  She is not medication compliant with lipitor 54m daily. She is not diet  compliant and denies chest pain, dyspnea, exertional chest pressure/discomfort, fatigue and skin xanthelasma or statin intolerance including myalgias.  Lab Results  Component Value Date   CHOL 206 (H) 07/07/2017   Lab Results  Component Value Date   HDL 21 (L) 07/07/2017   Lab Results  Component Value Date   LDLCALC UNABLE TO CALCULATE IF TRIGLYCERIDE OVER 400 mg/dL 07/07/2017   Lab Results  Component Value Date   TRIG 876 (H) 07/07/2017   Lab Results  Component Value Date   CHOLHDL 9.8 07/07/2017   Dilated Cardiomyopathy Diagnosed 2017. Last LVEF 40-45%. Per Cardiology notes reviewed etiology of cardiomyopathy was unclear. She continues to smoke. Last Cardiology office visit was 03-04-2016. She was instructed to follow up in 6 months.   Review of Systems  Constitutional: Negative for fever, malaise/fatigue and weight loss.  HENT: Positive for sore throat. Negative for nosebleeds.   Eyes: Negative.  Negative for blurred vision, double vision and photophobia.  Respiratory: Negative.  Negative for cough and shortness of breath.   Cardiovascular: Negative.  Negative for chest pain, palpitations and leg swelling.  Gastrointestinal: Negative.  Negative for heartburn, nausea and vomiting.  Musculoskeletal: Negative.  Negative for myalgias.  Neurological: Negative.  Negative for dizziness, focal weakness, seizures and headaches.  Psychiatric/Behavioral: Negative.  Negative for suicidal ideas.    Past Medical History:  Diagnosis Date  . Abdominal hernia   . Anemia   . CHF (congestive heart failure) ([facility]   . Diabetes mellitus without complication ([facility]   . Fibromyalgia   . Fibromyalgia   . Headache   . Hypertension   . Numbness on right side   . Wears dentures    top    Past Surgical History:  Procedure Laterality Date  . 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 CESAREAN SECTION    . HERNIA REPAIR    . MASS EXCISION Right 08/06/2012   Procedure: EXCISION OF LARGE MASS right FLANK WITH LIPO ASSISTANCE;  Surgeon: GCristine Polio MD;  [facility] [facility]  Service: [facility]astics;  Laterality: Right;  . TUBAL LIGATION      Family History  Problem Relation Age of Onset  . Hypertension Mother   . Diabetes Mother   . Heart disease Father   . Heart disease Brother     Social History Reviewed with no changes to be made today.   Outpatient Medications Prior to Visit  Medication Sig Dispense Refill  . aspirin 81 MG chewable tablet Chew 81 mg by mouth daily.    . blood glucose meter kit and supplies KIT Dispense based on patient and insurance preference. Use up to four times daily as directed. (FOR ICD-9 250.00, 250.01). 1 each 0  . atorvastatin (LIPITOR) 40 MG tablet Take 1 tablet (  40 mg total) by mouth daily. 30 tablet 3  . carvedilol (COREG) 6.25 MG tablet Take 1 tablet (6.25 mg total) by mouth 2 (two) times daily with a meal. 60 tablet 3  . furosemide (LASIX) 40 MG tablet Take 0.5 tablets (20 mg total) by mouth daily. 30 tablet 3  . glimepiride (AMARYL) 2 MG tablet Take 1 tablet (2 mg total) by mouth daily with breakfast. 30 tablet 2  . glucose blood (TRUE METRIX BLOOD GLUCOSE TEST) test strip Use as instructed 100 each 12  . Insulin Glargine (LANTUS) 100 UNIT/ML Solostar Pen Inject 45 Units into the skin every morning. 15 mL 2  . losartan (COZAAR) 25 MG tablet Take 1 tablet (25 mg total) by mouth daily. 30 tablet 3  . TRUEPLUS LANCETS 26G MISC Check blood sugar 3 times daily 100 each 0  . CALCIUM PO Take 1 tablet by mouth daily.     No facility-administered medications prior to visit.     No Known Allergies     Objective:    BP (!) 137/92 (BP Location: Right Arm, Patient Position: Sitting, Cuff Size: Normal)   Pulse 87   Temp 98.1 F (36.7 C) (Oral)   Ht 5' 9"   (1.753 m)   Wt 193 lb 3.2 oz (87.6 kg)   LMP 05/08/2013   SpO2 98%   BMI 28.53 kg/m  Wt Readings from Last 3 Encounters:  09/05/17 193 lb 3.2 oz (87.6 kg)  08/03/17 192 lb (87.1 kg)  07/20/17 187 lb (84.8 kg)    Physical Exam  Constitutional: She is oriented to person, place, and time. She appears well-developed and well-nourished. She is cooperative.  HENT:  Head: Normocephalic and atraumatic.  Eyes: EOM are normal.  Neck: Normal range of motion.  Cardiovascular: Normal rate, regular rhythm, normal heart sounds and intact distal pulses. Exam reveals no gallop and no friction rub.  No murmur heard. Pulmonary/Chest: Effort normal and breath sounds normal. No stridor. No tachypnea. No respiratory distress. She has no decreased breath sounds. She has no wheezes. She has no rhonchi. She has no rales. She exhibits no tenderness.  Abdominal: Bowel sounds are normal.  Musculoskeletal: Normal range of motion. She exhibits no edema.  Neurological: She is alert and oriented to person, place, and time. Coordination normal.  Skin: Skin is warm and dry.  Psychiatric: She has a normal mood and affect. Her behavior is normal. Judgment and thought content normal.  Nursing note and vitals reviewed.      Patient has been counseled extensively about nutrition and exercise as well as the importance of adherence with medications and regular follow-up. The patient was given clear instructions to go to ER or return to medical center if symptoms don't improve, worsen or new problems develop. The patient verbalized understanding.   Follow-up: Return in about 3 months (around 12/06/2017).   Gildardo Pounds, FNP-BC [facility] [place], [place]   09/07/2017, 9:06 AM

## 2017-09-06 MED FILL — FUROSEMIDE 40 MG TAB: 40 | 30 days supply | Qty: 15 | Fill #0

## 2017-09-06 MED FILL — TRUEPLUS PEN NDL 31GX3/16": 31G X 5 MM | 25 days supply | Qty: 100 | Fill #0

## 2017-09-06 MED FILL — TRUEPLUS PEN NDL 31GX3/16: 31G X 5 MM | 25 days supply | Qty: 100 | Fill #0

## 2017-09-06 MED FILL — LOSARTAN POTASSIUM 25 MG TA: 25 | 30 days supply | Qty: 30 | Fill #0

## 2017-09-06 MED FILL — !LANTUS SOLOSTAR 100UNITS/M: 100 | 26 days supply | Qty: 12 | Fill #0

## 2017-09-06 MED FILL — GLIMEPIRIDE 2 MG TABLET: 2 | 30 days supply | Qty: 30 | Fill #0 | Status: TO

## 2017-09-07 ENCOUNTER — Telehealth: Payer: Self-pay

## 2017-09-07 ENCOUNTER — Encounter: Payer: Self-pay | Admitting: Nurse Practitioner

## 2017-09-07 DIAGNOSIS — I420 Dilated cardiomyopathy: Secondary | ICD-10-CM | POA: Insufficient documentation

## 2017-09-07 NOTE — Telephone Encounter (Signed)
CMA called patient to inform to make a lab appt for fasting for her lipd panel.  Patient understood.

## 2017-09-07 NOTE — Telephone Encounter (Signed)
-----   Message from Gildardo Pounds, NP sent at 09/07/2017  8:57 AM EDT ----- Regarding: LIPID PANEL Please have patient make a lab appointment for fastling lipids. Her triglycerides are extremely elevated and will need to be repeated as soon as possible. She needs to be fasting at least 6-8 hours prior to lab work

## 2017-09-08 LAB — CULTURE, GROUP A STREP: STREP A CULTURE: NEGATIVE

## 2017-09-12 ENCOUNTER — Other Ambulatory Visit: Payer: Self-pay | Admitting: Nurse Practitioner

## 2017-09-12 ENCOUNTER — Telehealth: Payer: Self-pay

## 2017-09-12 DIAGNOSIS — E781 Pure hyperglyceridemia: Secondary | ICD-10-CM

## 2017-09-12 NOTE — Telephone Encounter (Signed)
-----   Message from Gildardo Pounds, NP sent at 09/11/2017  7:27 PM EDT ----- Strep culture was negative

## 2017-09-12 NOTE — Telephone Encounter (Signed)
CMA called patient to inform on lab results.  Patient understood.  

## 2017-09-13 ENCOUNTER — Ambulatory Visit: Payer: Self-pay | Attending: Family Medicine

## 2017-09-13 ENCOUNTER — Ambulatory Visit: Payer: Self-pay

## 2017-09-13 DIAGNOSIS — E781 Pure hyperglyceridemia: Secondary | ICD-10-CM | POA: Insufficient documentation

## 2017-09-13 MED FILL — ATORVASTATIN CALCIUM 40 MG: 40 | 30 days supply | Qty: 30 | Fill #0

## 2017-09-13 NOTE — Progress Notes (Signed)
Patient here for lab visit only 

## 2017-09-14 LAB — LIPID PANEL
CHOL/HDL RATIO: 3.9 ratio (ref 0.0–4.4)
Cholesterol, Total: 173 mg/dL (ref 100–199)
HDL: 44 mg/dL (ref >39)
LDL CALC: 100 mg/dL — AB (ref 0–99)
TRIGLYCERIDES: 143 mg/dL (ref 0–149)
VLDL CHOLESTEROL CAL: 29 mg/dL (ref 5–40)

## 2017-09-20 ENCOUNTER — Ambulatory Visit: Payer: Self-pay | Attending: Family Medicine | Admitting: Physician Assistant

## 2017-09-20 VITALS — BP 119/82 | HR 89 | Temp 98.3°F | Resp 16 | Wt 195.6 lb

## 2017-09-20 DIAGNOSIS — I509 Heart failure, unspecified: Secondary | ICD-10-CM | POA: Insufficient documentation

## 2017-09-20 DIAGNOSIS — M25562 Pain in left knee: Secondary | ICD-10-CM | POA: Insufficient documentation

## 2017-09-20 DIAGNOSIS — K439 Ventral hernia without obstruction or gangrene: Secondary | ICD-10-CM | POA: Insufficient documentation

## 2017-09-20 DIAGNOSIS — Z79899 Other long term (current) drug therapy: Secondary | ICD-10-CM | POA: Insufficient documentation

## 2017-09-20 DIAGNOSIS — R1013 Epigastric pain: Secondary | ICD-10-CM | POA: Insufficient documentation

## 2017-09-20 DIAGNOSIS — I110 Hypertensive heart disease with heart failure: Secondary | ICD-10-CM | POA: Insufficient documentation

## 2017-09-20 DIAGNOSIS — R05 Cough: Secondary | ICD-10-CM | POA: Insufficient documentation

## 2017-09-20 DIAGNOSIS — Z794 Long term (current) use of insulin: Secondary | ICD-10-CM | POA: Insufficient documentation

## 2017-09-20 DIAGNOSIS — R059 Cough, unspecified: Secondary | ICD-10-CM

## 2017-09-20 DIAGNOSIS — I10 Essential (primary) hypertension: Secondary | ICD-10-CM

## 2017-09-20 DIAGNOSIS — E119 Type 2 diabetes mellitus without complications: Secondary | ICD-10-CM | POA: Insufficient documentation

## 2017-09-20 DIAGNOSIS — Z7982 Long term (current) use of aspirin: Secondary | ICD-10-CM | POA: Insufficient documentation

## 2017-09-20 LAB — GLUCOSE, POCT (MANUAL RESULT ENTRY): POC Glucose: 108 mg/dl — AB (ref 70–99)

## 2017-09-20 MED ORDER — MELOXICAM 7.5 MG PO TABS: 7.5000 mg | ORAL_TABLET | Freq: Every day | ORAL | 1 refills | Status: AC

## 2017-09-20 MED ORDER — OMEPRAZOLE 20 MG PO CPDR: 20.0000 mg | DELAYED_RELEASE_CAPSULE | Freq: Two times a day (BID) | ORAL | 3 refills | Status: AC

## 2017-09-20 NOTE — Progress Notes (Signed)
Patient ID: Robin Pace, female   DOB: 09-28-1965, 52 y.o.   MRN: [id number]   Robin Pace, is a 52 y.o. female  WUJ:[id number]  [id number]  DOB - June 07, 1965  Subjective:  Chief Complaint and HPI: Robin Pace is a 52 y.o. female here today for multiple issues.  1 year h/o throat swelling and choking sensation at night.  No dysphagia with solids or liquids.  Also having L knee pain and swelling.  Seems worse after working long shifts.  Both knees hurt but L>R.  This has been going on for about 1 week.  No f/c.  No tick bites.    Also, wants to see about referral to Dr Towanda Malkin for ventral hernia repair.  Sometimes this area aches and bulges out.    ROS:   Constitutional:  No f/c, No night sweats, No unexplained weight loss. EENT:  No vision changes, No blurry vision, No hearing changes. No additional mouth, throat, or ear problems.  Respiratory: No cough, No SOB Cardiac: No CP, no palpitations GI:  No abd pain, No N/V/D. GU: No Urinary s/sx Musculoskeletal: B knee pain-L>R Neuro: No headache, no dizziness, no motor weakness.  Skin: No rash Endocrine:  No polydipsia. No polyuria.  Psych: Denies SI/HI  No problems updated.  ALLERGIES: No Known Allergies  PAST MEDICAL HISTORY: Past Medical History:  Diagnosis Date  . Abdominal hernia   . Anemia   . CHF (congestive heart failure) ([place])   . Diabetes mellitus without complication ([place])   . Fibromyalgia   . Fibromyalgia   . Headache   . Hypertension   . Numbness on right side   . Wears dentures    top    MEDICATIONS AT HOME: Prior to Admission medications   Medication Sig Start Date End Date Taking? Authorizing Provider  aspirin 81 MG chewable tablet Chew 81 mg by mouth daily. 11/26/15   [provider]  atorvastatin (LIPITOR) 40 MG tablet Take 1 tablet (40 mg total) by mouth daily. 09/05/17   Gildardo Pounds, NP  blood glucose meter kit and supplies KIT Dispense based on patient and insurance  preference. Use up to four times daily as directed. (FOR ICD-9 250.00, 250.01). 07/10/17   Domenic Polite, MD  CALCIUM PO Take 1 tablet by mouth daily.    [provider]  carvedilol (COREG) 6.25 MG tablet Take 1 tablet (6.25 mg total) by mouth 2 (two) times daily with a meal. 09/05/17   Gildardo Pounds, NP  furosemide (LASIX) 40 MG tablet Take 0.5 tablets (20 mg total) by mouth daily. 09/05/17   Gildardo Pounds, NP  glimepiride (AMARYL) 2 MG tablet Take 1 tablet (2 mg total) by mouth daily with breakfast. 09/05/17   Gildardo Pounds, NP  glucose blood (TRUE METRIX BLOOD GLUCOSE TEST) test strip Use as instructed 09/05/17   Gildardo Pounds, NP  Insulin Glargine (LANTUS) 100 UNIT/ML Solostar Pen Inject 45 Units into the skin every morning. 09/05/17   Gildardo Pounds, NP  Insulin Pen Needle (B-D UF III MINI PEN NEEDLES) 31G X 5 MM MISC Use as instructed 09/05/17   Gildardo Pounds, NP  losartan (COZAAR) 25 MG tablet Take 1 tablet (25 mg total) by mouth daily. 09/05/17   Gildardo Pounds, NP  meloxicam (MOBIC) 7.5 MG tablet Take 1 tablet (7.5 mg total) by mouth daily. Prn pain 09/20/17   Argentina Donovan, PA-C  omeprazole (PRILOSEC) 20 MG capsule Take 1 capsule (20 mg total) by  mouth 2 (two) times daily before a meal. For throat issues 09/20/17   Argentina Donovan, PA-C  TRUEPLUS LANCETS 28G MISC Use as instructed 09/05/17   Gildardo Pounds, NP     Objective:  EXAM:   Vitals:   09/20/17 1354  BP: 119/82  Pulse: 89  Resp: 16  Temp: 98.3 F (36.8 C)  TempSrc: Oral  SpO2: 96%  Weight: 195 lb 9.6 oz (88.7 kg)    General appearance : A&OX3. NAD. Non-toxic-appearing HEENT: Atraumatic and Normocephalic.  PERRLA. EOM intact.  TM clear B. Mouth-MMM, post pharynx WNL w/o erythema, No PND. Neck: supple, no JVD. No cervical lymphadenopathy. No thyromegaly Chest/Lungs:  Breathing-non-labored, Good air entry bilaterally, breath sounds normal without rales, rhonchi, or wheezing  CVS: S1 S2 regular, no  murmurs, gallops, rubs  Abdomen: Bowel sounds present, Non tender and not distended with no gaurding, rigidity or rebound.  Mild ventral hernia w/o signs of incarceration.   Extremities: Bilateral Lower Ext shows no edema, both legs are warm to touch with = pulse throughout.  Mild swelling just distal to the patella, medial aspect of knee(not actually sure if there is swelling bc this area is symmetrical).  There is no ballotment.  The joint itself is stable.   Neurology:  CN II-XII grossly intact, Non focal.   Psych:  TP linear. J/I WNL. Normal speech. Appropriate eye contact and affect.  Skin:  No Rash  Data Review Lab Results  Component Value Date   HGBA1C 11.5 (H) 07/07/2017     Assessment & Plan   1. Acute pain of left knee Trial meloxicam 7.63m daily - Sedimentation Rate - Vitamin D, 25-hydroxy  2. Diabetes mellitus without complication ([place] Controlled currently.  Continue current regimen - POCT glucose (manual entry)  3. Ventral hernia without obstruction or gangrene - Ambulatory referral to Plastic Surgery  4. Cough Trial omeprazole bid - H. pylori breath test  5. Dyspepsia Trial omeprazole bid - H. pylori breath test  6. Essential hypertension At goal-continue current regimen    Patient have been counseled extensively about nutrition and exercise  Return for keep 12/06/2017 appt.  The patient was given clear instructions to go to ER or return to medical center if symptoms don't improve, worsen or new problems develop. The patient verbalized understanding. The patient was told to call to get lab results if they haven't heard anything in the next week.     AFreeman Caldron PA-C [facility] [place]  09/20/2017, 2:45 PM

## 2017-09-21 ENCOUNTER — Telehealth: Payer: Self-pay

## 2017-09-21 ENCOUNTER — Other Ambulatory Visit: Payer: Self-pay | Admitting: Physician Assistant

## 2017-09-21 LAB — SEDIMENTATION RATE: Sed Rate: 14 mm/hr (ref 0–40)

## 2017-09-21 LAB — VITAMIN D 25 HYDROXY (VIT D DEFICIENCY, FRACTURES): Vit D, 25-Hydroxy: 15 ng/mL — ABNORMAL LOW (ref 30.0–100.0)

## 2017-09-21 MED ORDER — VITAMIN D (ERGOCALCIFEROL) 1.25 MG (50000 UNIT) PO CAPS: 50000.0000 [IU] | ORAL_CAPSULE | ORAL | 0 refills | Status: AC

## 2017-09-21 NOTE — Telephone Encounter (Signed)
Patient called regarding she needs a note from the pcp because he was out of work today because of her foot swollen   If approves by pcp fax it to her job [phone]  Send it to [employer]

## 2017-09-21 NOTE — Telephone Encounter (Signed)
CMA call patient to let her know that her pcp approves a note for her job to stay out of work for today & tomorrow  CMA will fax the letter to her job

## 2017-09-22 ENCOUNTER — Telehealth: Payer: Self-pay

## 2017-09-22 LAB — H. PYLORI BREATH TEST: H pylori Breath Test: NEGATIVE

## 2017-09-22 NOTE — Telephone Encounter (Signed)
Contacted pt to go over lab results pt is aware and doesn't have any questions or concerns 

## 2017-09-29 ENCOUNTER — Telehealth (INDEPENDENT_AMBULATORY_CARE_PROVIDER_SITE_OTHER): Payer: Self-pay

## 2017-09-29 NOTE — Telephone Encounter (Signed)
-----   Message from Gildardo Pounds, NP sent at 09/25/2017  4:32 PM EDT ----- Labs are essentially normal. Except LDL cholesterol is slightly elevated. Make sure you are drinking at least 48 oz of water per day. Work on eating a low fat, heart healthy diet and participate in regular aerobic exercise program to control as well. Exercise at least 150 minutes per week. Avoid red meats, fried foods. No junk foods, sodas, sugary drinks, unhealthy snacking, alcohol or smoking.

## 2017-09-29 NOTE — Telephone Encounter (Signed)
Patient is aware thatLabs are essentially normal. Except LDL cholesterol is slightly elevated. Make sure you are drinking at least 48 oz of water per day. Work on eating a low fat, heart healthy diet and participate in regular aerobic exercise program to control as well. Exercise at least 150 minutes per week. Avoid red meats, fried foods. No junk foods, sodas, sugary drinks, unhealthy snacking, alcohol or smoking. Patient expressed understanding. Patient states she now has insurance coverage, please place referrals discussed with patient. Nat Christen, CMA

## 2017-09-29 NOTE — Telephone Encounter (Signed)
Left message asking patient to call [employer]. Jimmey Hengel S Kailah Pennel, CMA  

## 2017-10-01 ENCOUNTER — Other Ambulatory Visit: Payer: Self-pay | Admitting: Nurse Practitioner

## 2017-10-01 DIAGNOSIS — I429 Cardiomyopathy, unspecified: Secondary | ICD-10-CM

## 2017-10-09 ENCOUNTER — Telehealth: Payer: Self-pay | Admitting: Nurse Practitioner

## 2017-10-09 NOTE — Telephone Encounter (Signed)
Will route to PCP 

## 2017-10-09 NOTE — Telephone Encounter (Signed)
Patient called stating that she would like to have a colonoscopy and mammogram done. Please f/u with patient.

## 2017-10-11 ENCOUNTER — Other Ambulatory Visit: Payer: Self-pay | Admitting: Nurse Practitioner

## 2017-10-11 ENCOUNTER — Encounter: Payer: Self-pay | Admitting: Gastroenterology

## 2017-10-11 DIAGNOSIS — Z1231 Encounter for screening mammogram for malignant neoplasm of breast: Secondary | ICD-10-CM

## 2017-10-11 DIAGNOSIS — Z1211 Encounter for screening for malignant neoplasm of colon: Secondary | ICD-10-CM

## 2017-10-11 NOTE — Telephone Encounter (Signed)
CMA attempt to call patient to inform her the Colonoscopy referral has been made and the imaging order for her mammogram  Has been order.   If patient call, please inform: Colonoscopy referral has been made and the imaging order for her mammogram  Has been order.  Pt. Will need to call [phone] to schedule an appt for her mammogram.

## 2017-10-11 NOTE — Telephone Encounter (Signed)
Referrals have been placed. Thank you.

## 2017-11-07 ENCOUNTER — Telehealth: Payer: Self-pay | Admitting: Nurse Practitioner

## 2017-11-07 NOTE — Telephone Encounter (Signed)
Spironolactone was discontinued upon her hospital discharge in March.  She should be taking lasix for her CHF

## 2017-11-07 NOTE — Telephone Encounter (Signed)
Patient called and stated that she has CHF and needs a refill on Spironolactone. Told patient that the medication was not on her current med list and she stated that she needs the medication and had been out for a couple of days. Patient uses [facility] pharmacy. Please f/u

## 2017-11-07 NOTE — Telephone Encounter (Signed)
Will route to PCP 

## 2017-11-10 NOTE — Telephone Encounter (Signed)
CMA attempt to call patient to inform on PCP advising.  No answer and left a VM for patient to call back.

## 2017-11-15 MED FILL — LOSARTAN POTASSIUM 25 MG TA: 25 | 30 days supply | Qty: 30 | Fill #1

## 2017-11-15 MED FILL — TRUE METRIX TEST STRIP: 25 days supply | Qty: 100 | Fill #0

## 2017-11-15 MED FILL — FUROSEMIDE 40 MG TAB: 40 | 30 days supply | Qty: 15 | Fill #1

## 2017-11-15 MED FILL — ATORVASTATIN CALCIUM 40 MG: 40 | 30 days supply | Qty: 30 | Fill #1

## 2017-11-15 MED FILL — CARVEDILOL 6.25 MG TABLET: 6.25 | 30 days supply | Qty: 60 | Fill #0

## 2017-11-16 MED FILL — LANTUS SOLOSTAR 100 UNITS/M: 100 | 26 days supply | Qty: 12 | Fill #1

## 2017-11-28 ENCOUNTER — Ambulatory Visit: Payer: Self-pay

## 2017-11-29 ENCOUNTER — Telehealth: Payer: Self-pay | Admitting: *Deleted

## 2017-11-29 NOTE — Telephone Encounter (Signed)
Pt never called to RS PV by 5 pm  - cancelled PV and colon 8-14- Mailed NS letter

## 2017-11-29 NOTE — Telephone Encounter (Signed)
Pt no showed her 2 pm pre visit today - called- left message to return call and RS by 5 pm today to prevent 8-14 colon being cancelled

## 2017-11-30 ENCOUNTER — Encounter: Payer: Self-pay | Admitting: Nurse Practitioner

## 2017-12-06 ENCOUNTER — Ambulatory Visit: Payer: Self-pay | Admitting: Nurse Practitioner

## 2017-12-13 ENCOUNTER — Telehealth: Payer: Self-pay | Admitting: Nurse Practitioner

## 2017-12-13 ENCOUNTER — Encounter: Payer: Self-pay | Admitting: Gastroenterology

## 2017-12-13 DIAGNOSIS — E119 Type 2 diabetes mellitus without complications: Secondary | ICD-10-CM

## 2017-12-13 MED ORDER — GLIMEPIRIDE 2 MG PO TABS: 2.0000 mg | ORAL_TABLET | Freq: Every day | ORAL | 1 refills | Status: DC

## 2017-12-13 NOTE — Telephone Encounter (Signed)
Patient called requesting refills for glimepiride, uses [facility] on [address] in [place]. Has already made appointment for 9/17.

## 2017-12-19 ENCOUNTER — Encounter: Payer: Self-pay | Admitting: Gastroenterology

## 2017-12-19 ENCOUNTER — Other Ambulatory Visit: Payer: Self-pay

## 2017-12-19 ENCOUNTER — Ambulatory Visit (AMBULATORY_SURGERY_CENTER): Payer: Self-pay

## 2017-12-19 VITALS — Ht 68.0 in | Wt 194.8 lb

## 2017-12-19 DIAGNOSIS — Z1211 Encounter for screening for malignant neoplasm of colon: Secondary | ICD-10-CM

## 2017-12-19 NOTE — Progress Notes (Signed)
No egg or soy allergy known to patient  No issues with past sedation with any surgeries  or procedures, no intubation problems  No diet pills per patient No home 02 use per patient  No blood thinners per patient  Pt denies issues with constipation  No A fib or A flutter  EMMI video sent to pt's e mail  

## 2017-12-26 ENCOUNTER — Ambulatory Visit (AMBULATORY_SURGERY_CENTER): Payer: BLUE CROSS/BLUE SHIELD | Admitting: Gastroenterology

## 2017-12-26 ENCOUNTER — Encounter: Payer: Self-pay | Admitting: Gastroenterology

## 2017-12-26 VITALS — BP 113/78 | HR 77 | Temp 97.5°F | Resp 14 | Ht 68.0 in | Wt 194.0 lb

## 2017-12-26 DIAGNOSIS — Z1211 Encounter for screening for malignant neoplasm of colon: Secondary | ICD-10-CM

## 2017-12-26 DIAGNOSIS — Z800 Family history of malignant neoplasm of digestive organs: Secondary | ICD-10-CM

## 2017-12-26 MED ORDER — SODIUM CHLORIDE 0.9 % IV SOLN: 500.0000 mL | Freq: Once | INTRAVENOUS | Status: DC

## 2017-12-26 NOTE — Progress Notes (Signed)
Pt's states no medical or surgical changes since previsit or office visit. 

## 2017-12-26 NOTE — Progress Notes (Signed)
To PACU, VSS. Report to [email or web address] 

## 2017-12-26 NOTE — Op Note (Signed)
[place] Patient Name: Robin Pace Procedure Date: 12/26/2017 11:10 AM MRN: [id number] Endoscopist: Mallie Mussel L. Loletha Carrow , MD Age: 52 Referring MD:  Date of Birth: 04-03-1966 Gender: Female Account #: [id number] Procedure:                Colonoscopy Indications:              Colon cancer screening in patient at increased                            risk: Colorectal cancer in brother Medicines:                Monitored Anesthesia Care Procedure:                Pre-Anesthesia Assessment:                           - Prior to the procedure, a History and Physical                            was performed, and patient medications and                            allergies were reviewed. The patient's tolerance of                            previous anesthesia was also reviewed. The risks                            and benefits of the procedure and the sedation                            options and risks were discussed with the patient.                            All questions were answered, and informed consent                            was obtained. Anticoagulants: The patient has taken                            aspirin. It was decided not to withhold this                            medication prior to the procedure. ASA Grade                            Assessment: III - A patient with severe systemic                            disease. After reviewing the risks and benefits,                            the patient was deemed in satisfactory condition to  undergo the procedure.                           After obtaining informed consent, the colonoscope                            was passed under direct vision. Throughout the                            procedure, the patient's blood pressure, pulse, and                            oxygen saturations were monitored continuously. The                            Colonoscope was introduced through the anus and                             advanced to the the cecum, identified by                            appendiceal orifice and ileocecal valve. The                            colonoscopy was performed without difficulty. The                            patient tolerated the procedure well. The quality                            of the bowel preparation was excellent. The                            ileocecal valve, appendiceal orifice, and rectum                            were photographed. The quality of the bowel                            preparation was evaluated using the BBPS [facility]                            Bowel Preparation Scale) with scores of: Right                            Colon = 3, Transverse Colon = 3 and Left Colon = 3                            (entire mucosa seen well with no residual staining,                            small fragments of stool or opaque liquid). The  total BBPS score equals 9. The bowel preparation                            used was Plenvu. Scope In: 11:16:22 AM Scope Out: 11:29:36 AM Scope Withdrawal Time: 0 hours 9 minutes 49 seconds  Total Procedure Duration: 0 hours 13 minutes 14 seconds  Findings:                 The perianal and digital rectal examinations were                            normal.                           The entire examined colon appeared normal on direct                            and retroflexion views. Complications:            No immediate complications. Estimated Blood Loss:     Estimated blood loss: none. Impression:               - The entire examined colon is normal on direct and                            retroflexion views.                           - No specimens collected. Recommendation:           - Patient has a contact number available for                            emergencies. The signs and symptoms of potential                            delayed complications were discussed with the                             patient. Return to normal activities tomorrow.                            Written discharge instructions were provided to the                            patient.                           - Resume previous diet.                           - Continue present medications.                           - Repeat colonoscopy in 5 years for screening                            purposes. Henry L. Loletha Carrow, MD  12/26/2017 11:40:08 AM This report has been signed electronically.

## 2017-12-26 NOTE — Patient Instructions (Signed)
YOU HAD AN ENDOSCOPIC PROCEDURE TODAY AT THE [facility]:   Refer to the procedure report that was given to you for any specific questions about what was found during the examination.  If the procedure report does not answer your questions, please call your gastroenterologist to clarify.  If you requested that your care partner not be given the details of your procedure findings, then the procedure report has been included in a sealed envelope for you to review at your convenience later.  YOU SHOULD EXPECT: Some feelings of bloating in the abdomen. Passage of more gas than usual.  Walking can help get rid of the air that was put into your GI tract during the procedure and reduce the bloating. If you had a lower endoscopy (such as a colonoscopy or flexible sigmoidoscopy) you may notice spotting of blood in your stool or on the toilet paper. If you underwent a bowel prep for your procedure, you may not have a normal bowel movement for a few days.  Please Note:  You might notice some irritation and congestion in your nose or some drainage.  This is from the oxygen used during your procedure.  There is no need for concern and it should clear up in a day or so.  SYMPTOMS TO REPORT IMMEDIATELY:   Following lower endoscopy (colonoscopy or flexible sigmoidoscopy):  Excessive amounts of blood in the stool  Significant tenderness or worsening of abdominal pains  Swelling of the abdomen that is new, acute  Fever of 100F or higher  For urgent or emergent issues, a gastroenterologist can be reached at any hour by calling [phone].   DIET:  We do recommend a small meal at first, but then you may proceed to your regular diet.  Drink plenty of fluids but you should avoid alcoholic beverages for 24 hours.  ACTIVITY:  You should plan to take it easy for the rest of today and you should NOT DRIVE or use heavy machinery until tomorrow (because of the sedation medicines used during the test).     FOLLOW UP: Our staff will call the number listed on your records the next business day following your procedure to check on you and address any questions or concerns that you may have regarding the information given to you following your procedure. If we do not reach you, we will leave a message.  However, if you are feeling well and you are not experiencing any problems, there is no need to return our call.  We will assume that you have returned to your regular daily activities without incident.  If any biopsies were taken you will be contacted by phone or by letter within the next 1-3 weeks.  Please call us at [phone] if you have not heard about the biopsies in 3 weeks.    SIGNATURES/CONFIDENTIALITY: You and/or your care partner have signed paperwork which will be entered into your electronic medical record.  These signatures attest to the fact that that the information above on your After Visit Summary has been reviewed and is understood.  Full responsibility of the confidentiality of this discharge information lies with you and/or your care-partner. 

## 2017-12-27 ENCOUNTER — Telehealth: Payer: Self-pay

## 2017-12-27 ENCOUNTER — Telehealth: Payer: Self-pay | Admitting: *Deleted

## 2017-12-27 NOTE — Telephone Encounter (Signed)
  Follow up Call-  Call back number 12/26/2017  Post procedure Call Back phone  # [phone]  Permission to leave phone message Yes  Some recent data might be hidden     Patient questions:  Do you have a fever, pain , or abdominal swelling? No. Pain Score  0 *  Have you tolerated food without any problems? Yes.    Have you been able to return to your normal activities? Yes.    Do you have any questions about your discharge instructions: Diet   No. Medications  No. Follow up visit  No.  Do you have questions or concerns about your Care? No.  Actions: * If pain score is 4 or above: No action needed, pain <4.

## 2017-12-28 NOTE — Telephone Encounter (Signed)
Judson Roch Monday, RN charted the telephone encounter on this patient on 12/27/17

## 2018-01-10 ENCOUNTER — Emergency Department: Payer: Self-pay

## 2018-01-10 ENCOUNTER — Other Ambulatory Visit: Payer: Self-pay

## 2018-01-10 ENCOUNTER — Encounter: Payer: Self-pay | Admitting: Emergency Medicine

## 2018-01-10 ENCOUNTER — Emergency Department
Admission: EM | Admit: 2018-01-10 | Discharge: 2018-01-10 | Disposition: A | Discharge status: Home / Self Care | Payer: Self-pay | Attending: Emergency Medicine | Admitting: Emergency Medicine

## 2018-01-10 ENCOUNTER — Telehealth: Payer: Self-pay | Admitting: Nurse Practitioner

## 2018-01-10 DIAGNOSIS — R42 Dizziness and giddiness: Secondary | ICD-10-CM | POA: Insufficient documentation

## 2018-01-10 DIAGNOSIS — I110 Hypertensive heart disease with heart failure: Secondary | ICD-10-CM | POA: Insufficient documentation

## 2018-01-10 DIAGNOSIS — Z794 Long term (current) use of insulin: Secondary | ICD-10-CM | POA: Insufficient documentation

## 2018-01-10 DIAGNOSIS — Z7982 Long term (current) use of aspirin: Secondary | ICD-10-CM | POA: Insufficient documentation

## 2018-01-10 DIAGNOSIS — E119 Type 2 diabetes mellitus without complications: Secondary | ICD-10-CM | POA: Insufficient documentation

## 2018-01-10 DIAGNOSIS — E860 Dehydration: Secondary | ICD-10-CM

## 2018-01-10 DIAGNOSIS — F17210 Nicotine dependence, cigarettes, uncomplicated: Secondary | ICD-10-CM | POA: Insufficient documentation

## 2018-01-10 DIAGNOSIS — I509 Heart failure, unspecified: Secondary | ICD-10-CM | POA: Insufficient documentation

## 2018-01-10 DIAGNOSIS — Z79899 Other long term (current) drug therapy: Secondary | ICD-10-CM | POA: Insufficient documentation

## 2018-01-10 LAB — CBC WITH DIFFERENTIAL/PLATELET
Basophils Absolute: 0 10*3/uL (ref 0–0.1)
Basophils Relative: 1 %
EOS PCT: 2 %
Eosinophils Absolute: 0.1 10*3/uL (ref 0–0.7)
HCT: 40.1 % (ref 35.0–47.0)
Hemoglobin: 13.2 g/dL (ref 12.0–16.0)
LYMPHS ABS: 1.7 10*3/uL (ref 1.0–3.6)
LYMPHS PCT: 30 %
MCH: 27.6 pg (ref 26.0–34.0)
MCHC: 32.8 g/dL (ref 32.0–36.0)
MCV: 84.1 fL (ref 80.0–100.0)
MONO ABS: 0.6 10*3/uL (ref 0.2–0.9)
MONOS PCT: 11 %
Neutro Abs: 3.2 10*3/uL (ref 1.4–6.5)
Neutrophils Relative %: 56 %
Platelets: 214 10*3/uL (ref 150–440)
RBC: 4.77 MIL/uL (ref 3.80–5.20)
RDW: 14.9 % — ABNORMAL HIGH (ref 11.5–14.5)
WBC: 5.7 10*3/uL (ref 3.6–11.0)

## 2018-01-10 LAB — COMPREHENSIVE METABOLIC PANEL
ALK PHOS: 70 U/L (ref 38–126)
ALT: 26 U/L (ref 0–44)
AST: 22 U/L (ref 15–41)
Albumin: 4.1 g/dL (ref 3.5–5.0)
Anion gap: 7 (ref 5–15)
BILIRUBIN TOTAL: 0.8 mg/dL (ref 0.3–1.2)
BUN: 15 mg/dL (ref 6–20)
CALCIUM: 9.1 mg/dL (ref 8.9–10.3)
CO2: 27 mmol/L (ref 22–32)
CREATININE: 0.67 mg/dL (ref 0.44–1.00)
Chloride: 107 mmol/L (ref 98–111)
GFR calc Af Amer: >60 mL/min (ref >60)
GFR calc non Af Amer: >60 mL/min (ref >60)
GLUCOSE: 180 mg/dL — AB (ref 70–99)
Potassium: 3.7 mmol/L (ref 3.5–5.1)
Sodium: 141 mmol/L (ref 135–145)
TOTAL PROTEIN: 7.8 g/dL (ref 6.5–8.1)

## 2018-01-10 LAB — TROPONIN I: Troponin I: <0.03 ng/mL (ref <0.03)

## 2018-01-10 MED ORDER — DIPHENHYDRAMINE HCL 50 MG/ML IJ SOLN
25.0000 mg | Freq: Once | INTRAMUSCULAR | Status: AC
  Administered 2018-01-10: 25 mg via INTRAVENOUS
  Filled 2018-01-10: qty 1

## 2018-01-10 MED ORDER — MECLIZINE HCL 25 MG PO TABS
25.0000 mg | ORAL_TABLET | Freq: Once | ORAL | Status: AC
  Administered 2018-01-10: 25 mg via ORAL
  Filled 2018-01-10: qty 1

## 2018-01-10 MED ORDER — SODIUM CHLORIDE 0.9 % IV BOLUS
1000.0000 mL | Freq: Once | INTRAVENOUS | Status: AC
  Administered 2018-01-10: 1000 mL via INTRAVENOUS

## 2018-01-10 MED ORDER — MECLIZINE HCL 25 MG PO TABS: 25.0000 mg | ORAL_TABLET | Freq: Three times a day (TID) | ORAL | 0 refills | Status: AC | PRN

## 2018-01-10 MED ORDER — METOCLOPRAMIDE HCL 5 MG/ML IJ SOLN
10.0000 mg | Freq: Once | INTRAMUSCULAR | Status: AC
  Administered 2018-01-10: 10 mg via INTRAVENOUS
  Filled 2018-01-10: qty 2

## 2018-01-10 NOTE — ED Triage Notes (Signed)
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 Pt states that she was working with a patient this morning when she became lightheaded and dizzy. Still dizzy but it has eased off a little. Denies any pain. Felt nauseous at the time. Has eased off but still "a little" nauseous.  States this has happened before, when she was dx with HTN.

## 2018-01-10 NOTE — ED Notes (Signed)
Pt wanting to wait to be able to eat to take antivert tablet.

## 2018-01-10 NOTE — Discharge Instructions (Addendum)
Stay hydrated. Eat normally.   Take meclizine as needed for dizziness.   See neurology for follow up   Return to ER if you have worse dizziness, passing out, trouble walking, vomiting, trouble speaking

## 2018-01-10 NOTE — ED Notes (Signed)
Patient transported to CT 

## 2018-01-10 NOTE — ED Provider Notes (Signed)
[facility] EMERGENCY DEPARTMENT Provider Note   CSN: [id number] Arrival date & time: 01/10/18  9509     History   Chief Complaint Chief Complaint  Patient presents with  . Dizziness    HPI Robin Pace is a 52 y.o. female history of diabetes, fibromyalgia, hypertension, previous vertigo here presenting with vertigo.  Patient states that she did not eat anything for breakfast and got to work around 7 AM.  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  She described as the room spinning and she has a hard time walking.  Also associated with some headaches but no blurry vision or vomiting.  Denies any neck pain or fevers.  Patient states that she had similar episode several years ago and was diagnosed with vertigo.  She states that it improved with meclizine previously but did not take any prior to arrival.  Patient denies any trouble speaking or history of strokes.  The history is provided by the patient.    Past Medical History:  Diagnosis Date  . Abdominal hernia   . Anemia   . CHF (congestive heart failure) ([place])   . Diabetes mellitus without complication ([place])   . Fibromyalgia   . Fibromyalgia   . Headache   . Hypertension   . Numbness on right side   . Wears dentures    top    Patient Active Problem List   Diagnosis Date Noted  . Dilated cardiomyopathy ([place]) 09/07/2017  . Hyperglycemia 07/06/2017  . Tachycardia 07/06/2017  . Diabetes mellitus without complication ([place]) 32/67/1245  . Hypertension 07/06/2017  . Neck pain on right side 09/10/2013  . Low back pain 09/10/2013  . Headache   . Numbness on right side     Past Surgical History:  Procedure Laterality Date  . 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 CESAREAN SECTION      . HERNIA REPAIR    . MASS EXCISION Right 08/06/2012   Procedure: EXCISION OF LARGE MASS right FLANK WITH LIPO ASSISTANCE;  Surgeon: Cristine Polio, MD;  Location: [facility];  Service: Plastics;  Laterality: Right;  . TUBAL LIGATION       OB History    Gravida  2   Para  2   Term  1   Preterm  1   AB      Living  2     SAB      TAB      Ectopic      Multiple      Live Births               Home Medications    Prior to Admission medications   Medication Sig Start Date End Date Taking? Authorizing Provider  aspirin 81 MG chewable tablet Chew 81 mg by mouth daily. 11/26/15   [provider]  atorvastatin (LIPITOR) 40 MG tablet Take 1 tablet (40 mg total) by mouth daily. 09/05/17   Gildardo Pounds, NP  blood glucose meter kit and supplies KIT Dispense based on patient and insurance preference. Use up to four times daily as directed. (FOR ICD-9 250.00, 250.01). 07/10/17   Domenic Polite, MD  CALCIUM PO Take 1 tablet by mouth daily.    [provider]  carvedilol (COREG) 6.25 MG tablet  Take 1 tablet (6.25 mg total) by mouth 2 (two) times daily with a meal. 09/05/17   Gildardo Pounds, NP  furosemide (LASIX) 40 MG tablet Take 0.5 tablets (20 mg total) by mouth daily. 09/05/17   Gildardo Pounds, NP  glimepiride (AMARYL) 2 MG tablet Take 1 tablet (2 mg total) by mouth daily with breakfast. 12/13/17   Gildardo Pounds, NP  glucose blood (TRUE METRIX BLOOD GLUCOSE TEST) test strip Use as instructed 09/05/17   Gildardo Pounds, NP  Insulin Glargine (LANTUS) 100 UNIT/ML Solostar Pen Inject 45 Units into the skin every morning. 09/05/17   Gildardo Pounds, NP  Insulin Pen Needle (B-D UF III MINI PEN NEEDLES) 31G X 5 MM MISC Use as instructed 09/05/17   Gildardo Pounds, NP  losartan (COZAAR) 25 MG tablet Take 1 tablet (25 mg total) by mouth daily. 09/05/17   Gildardo Pounds, NP  meloxicam (MOBIC) 7.5 MG tablet Take 1 tablet (7.5 mg total) by mouth daily. Prn  pain Patient not taking: Reported on 12/26/2017 09/20/17   Argentina Donovan, PA-C  omeprazole (PRILOSEC) 20 MG capsule Take 1 capsule (20 mg total) by mouth 2 (two) times daily before a meal. For throat issues Patient not taking: Reported on 12/19/2017 09/20/17   Argentina Donovan, PA-C  spironolactone (ALDACTONE) 25 MG tablet Take by mouth. 09/30/16   [provider]  TRUEPLUS LANCETS 28G MISC Use as instructed 09/05/17   Gildardo Pounds, NP  Vitamin D, Ergocalciferol, (DRISDOL) 50000 units CAPS capsule Take 1 capsule (50,000 Units total) by mouth every 7 (seven) days. 09/21/17   Argentina Donovan, PA-C    Family History Family History  Problem Relation Age of Onset  . Hypertension Mother   . Diabetes Mother   . Heart disease Father   . Heart disease Brother   . Colon cancer Brother   . Esophageal cancer Neg Hx   . Rectal cancer Neg Hx   . Stomach cancer Neg Hx     Social History Social History   Tobacco Use  . Smoking status: Current Every Day Smoker    Packs/day: 0.25    Years: 30.00    Pack years: 7.50    Types: Cigarettes  . Smokeless tobacco: Never Used  Substance Use Topics  . Alcohol use: Yes  . Drug use: No     Allergies   Patient has no known allergies.   Review of Systems Review of Systems  Neurological: Positive for dizziness.  All other systems reviewed and are negative.    Physical Exam Updated Vital Signs BP (!) 149/100   Pulse 86   Temp 97.9 F (36.6 C) (Oral)   Resp 14   Ht 5' 8"  (1.727 m)   Wt 87.1 kg   LMP 05/08/2013   SpO2 100%   BMI 29.19 kg/m   Physical Exam  Constitutional: She is oriented to person, place, and time.  Uncomfortable, nauseated   HENT:  Head: Normocephalic.  Mouth/Throat: Oropharynx is clear and moist.  Eyes: Pupils are equal, round, and reactive to light. Conjunctivae are normal.  Some nystagmus to the Left, doesn't change with directions. No rotatory or vertical nystagmus   Neck: Normal range of motion.  Neck supple.  Cardiovascular: Normal rate, regular rhythm and normal heart sounds.  Pulmonary/Chest: Effort normal and breath sounds normal. No stridor. No respiratory distress. She has no wheezes.  Abdominal: Soft. Bowel sounds are normal. She exhibits no distension. There is no  tenderness.  Musculoskeletal: Normal range of motion.  Neurological: She is alert and oriented to person, place, and time.  CN 2- 12 intact. Nl strength throughout. ? Abnormal finger to nose on the left side. Unable to walk due to severe dizziness   Skin: Skin is warm.  Psychiatric: She has a normal mood and affect.  Nursing note and vitals reviewed.    ED Treatments / Results  Labs (all labs ordered are listed, but only abnormal results are displayed) Labs Reviewed  CBC WITH DIFFERENTIAL/PLATELET - Abnormal; Notable for the following components:      Result Value   RDW 14.9 (*)    All other components within normal limits  COMPREHENSIVE METABOLIC PANEL - Abnormal; Notable for the following components:   Glucose, Bld 180 (*)    All other components within normal limits  TROPONIN I    EKG EKG Interpretation  Date/Time:  Wednesday January 10 2018 08:37:44 EDT Ventricular Rate:  77 PR Interval:    QRS Duration: 110 QT Interval:  439 QTC Calculation: 497 R Axis:   37 Text Interpretation:  Sinus rhythm Left ventricular hypertrophy Borderline T abnormalities, anterior leads Borderline prolonged QT interval No significant change since last tracing Confirmed by Wandra Arthurs [phone]) on 01/10/2018 8:49:39 AM   Radiology Dg Chest 2 View  Result Date: 01/10/2018 CLINICAL DATA:  Onset of dizziness and lightheadedness with near syncopal episode while at work this morning. Symptoms have persisted but improved slightly. There was some associated mid chest pain and shortness of breath with nausea. History of CHF, hypertension, diabetes, current smoker. EXAM: CHEST - 2 VIEW COMPARISON:  PA and lateral chest x-ray  of July 06, 2017 FINDINGS: The lungs are hyperinflated with mild hemidiaphragm flattening. Mildly increased lung markings are noted in the anterior aspect of the left lower lobe. The interstitial markings are coarse though stable. The heart and pulmonary vascularity are normal. The mediastinum is normal in width. There is no pleural effusion. The bony thorax is unremarkable. IMPRESSION: Chronic bronchitic-smoking related changes. Subsegmental atelectasis or small area of pneumonia in the anterior aspect of the left lower lobe. No CHF. Followup PA and lateral chest X-ray is recommended in 3-4 weeks following trial of antibiotic therapy to ensure resolution and exclude underlying malignancy. Electronically Signed   By: Vaughan Garfinkle  [place] M.D.   On: 01/10/2018 09:41   Ct Head Wo Contrast  Result Date: 01/10/2018 CLINICAL DATA:  Vertigo EXAM: CT HEAD WITHOUT CONTRAST TECHNIQUE: Contiguous axial images were obtained from the base of the skull through the vertex without intravenous contrast. COMPARISON:  12/21/2016 FINDINGS: Brain: No acute intracranial abnormality. Specifically, no hemorrhage, hydrocephalus, mass lesion, acute infarction, or significant intracranial injury. Vascular: No hyperdense vessel or unexpected calcification. Skull: No acute calvarial abnormality. Sinuses/Orbits: Visualized paranasal sinuses and mastoids clear. Orbital soft tissues unremarkable. Other: None IMPRESSION: Normal study. Electronically Signed   By: Rolm Baptise M.D.   On: 01/10/2018 10:07    Procedures Procedures (including critical care time)  Medications Ordered in ED Medications  sodium chloride 0.9 % bolus 1,000 mL (1,000 mLs Intravenous Bolus 01/10/18 0941)  meclizine (ANTIVERT) tablet 25 mg (25 mg Oral Given 01/10/18 1112)  metoCLOPramide (REGLAN) injection 10 mg (10 mg Intravenous Given 01/10/18 0942)  diphenhydrAMINE (BENADRYL) injection 25 mg (25 mg Intravenous Given 01/10/18 0943)     Initial Impression / Assessment  and Plan / ED Course  I have reviewed the triage vital signs and the nursing notes.  Pertinent labs & imaging  results that were available during my care of the patient were reviewed by me and considered in my medical decision making (see chart for details).     Robin Pace is a 52 y.o. female here with dizziness. Likely peripheral vertigo as she had this in the past. No trouble speaking but she has ? Abnormal finger on the left side. Will get labs, CT head. Will give meclizine and reassess.   11:44 AM After meclizine, felt better. No nystagmus and nl finger to nose nose. Ambulated by herself with no issues. Labs and CT head unremarkable. CXR showed atelectasis vs pneumonia but she had no cough and WBC count is normal. Will dc home with meclizine, neuro follow up.    Final Clinical Impressions(s) / ED Diagnoses   Final diagnoses:  None    ED Discharge Orders    None       Drenda Freeze, MD 01/10/18 1145

## 2018-01-10 NOTE — Telephone Encounter (Signed)
Pt called to request the following medication -carvedilol (COREG) 6.25 MG tablet  -atorvastatin (LIPITOR) 40 MG tablet  -losartan (COZAAR) 25 MG tablet  Sent to -[facility], [place] She states she is completely out of these medications. she was informed that our pharmacy was closed and will open the next day to be able to transfer the medications.she was also explained that medications should be requested by the desired pharmacy, she was not happy and hung up the phone.please follow up as soon as possible

## 2018-01-11 NOTE — Telephone Encounter (Signed)
Left VM for pt reiterating that she has refills at the pharmacy and to transfer them she should contact [facility] to initiate the transfer.

## 2018-01-16 ENCOUNTER — Ambulatory Visit: Payer: Self-pay | Admitting: Nurse Practitioner

## 2018-01-16 MED FILL — LOSARTAN POTASSIUM 25 MG TA: 25 | 30 days supply | Qty: 30 | Fill #2

## 2018-01-16 MED FILL — !LANTUS SOLOSTAR 100UNITS/M: 100 | 26 days supply | Qty: 12 | Fill #2

## 2018-01-16 MED FILL — TRUE METRIX TEST STRIP: 25 days supply | Qty: 100 | Fill #1

## 2018-01-16 MED FILL — ATORVASTATIN CALCIUM 40 MG: 40 | 30 days supply | Qty: 30 | Fill #2

## 2018-01-16 MED FILL — CARVEDILOL 6.25 MG TABLET: 6.25 | 30 days supply | Qty: 60 | Fill #1

## 2018-01-31 MED FILL — CARVEDILOL 6.25 MG TABLET: 6.25 | 30 days supply | Qty: 60 | Fill #1

## 2018-01-31 MED FILL — FUROSEMIDE 40 MG TAB: 40 | 30 days supply | Qty: 15 | Fill #2

## 2018-01-31 MED FILL — ATORVASTATIN CALCIUM 40 MG: 40 | 30 days supply | Qty: 30 | Fill #2

## 2018-01-31 MED FILL — !LANTUS SOLOSTAR 100UNITS/M: 100 | 20 days supply | Qty: 9 | Fill #2

## 2018-01-31 MED FILL — LOSARTAN POTASSIUM 25 MG TA: 25 | 30 days supply | Qty: 30 | Fill #2

## 2018-04-12 ENCOUNTER — Telehealth: Payer: Self-pay | Admitting: Nurse Practitioner

## 2018-04-12 DIAGNOSIS — E119 Type 2 diabetes mellitus without complications: Secondary | ICD-10-CM

## 2018-04-12 DIAGNOSIS — E785 Hyperlipidemia, unspecified: Secondary | ICD-10-CM

## 2018-04-12 DIAGNOSIS — I10 Essential (primary) hypertension: Secondary | ICD-10-CM

## 2018-04-12 NOTE — Telephone Encounter (Signed)
Pt request INSULIN. Pt has no more pens left. Pt request GLIMEPRIDE as well. [facility].  Call pt [phone].

## 2018-04-12 NOTE — Telephone Encounter (Signed)
1) Medication(s) Requested (by name): Glimepiride Carvedilol Furosemide atorvastain insulin 2) Pharmacy of Choice:  [facility].  Call pt [phone].

## 2018-04-13 NOTE — Telephone Encounter (Signed)
Attempted to call patient to clarify location that prescriptions should be sent to. I need the street address to ensure we [place] rx's [place].

## 2018-04-13 NOTE — Telephone Encounter (Signed)
Duplicate request

## 2018-04-16 MED ORDER — CARVEDILOL 6.25 MG PO TABS: 6.2500 mg | ORAL_TABLET | Freq: Two times a day (BID) | ORAL | 0 refills | Status: AC

## 2018-04-16 MED ORDER — ATORVASTATIN CALCIUM 40 MG PO TABS: 40.0000 mg | ORAL_TABLET | Freq: Every day | ORAL | 0 refills | Status: AC

## 2018-04-16 MED ORDER — FUROSEMIDE 40 MG PO TABS: 20.0000 mg | ORAL_TABLET | Freq: Every day | ORAL | 0 refills | Status: AC

## 2018-04-16 MED ORDER — GLIMEPIRIDE 2 MG PO TABS: 2.0000 mg | ORAL_TABLET | Freq: Every day | ORAL | 0 refills | Status: AC

## 2018-04-16 MED ORDER — INSULIN PEN NEEDLE 31G X 5 MM MISC: 0 refills | Status: AC

## 2018-04-16 MED ORDER — INSULIN GLARGINE 100 UNIT/ML SOLOSTAR PEN: 45.0000 [IU] | PEN_INJECTOR | Freq: Every morning | SUBCUTANEOUS | 0 refills | Status: AC

## 2018-04-16 MED ORDER — "INSULIN SYRINGE-NEEDLE U-100 31G X 5/16"" 0.5 ML MISC": 0 refills | Status: AC

## 2018-04-16 NOTE — Telephone Encounter (Signed)
Patient confirmed pharmacy location. She was made aware that she must see a provider here before we can authorize any further refills.

## 2018-06-06 IMAGING — CR DG CHEST 2V
2 series · 2 of 2 positions shown · non-contrast
Comparison: Chest x-ray of 02/04/2017

CLINICAL DATA: Frequent urination, dry mouth, some dizziness, sharp
chest pain, smoking history

EXAM:
CHEST - 2 VIEW

[chest pa]
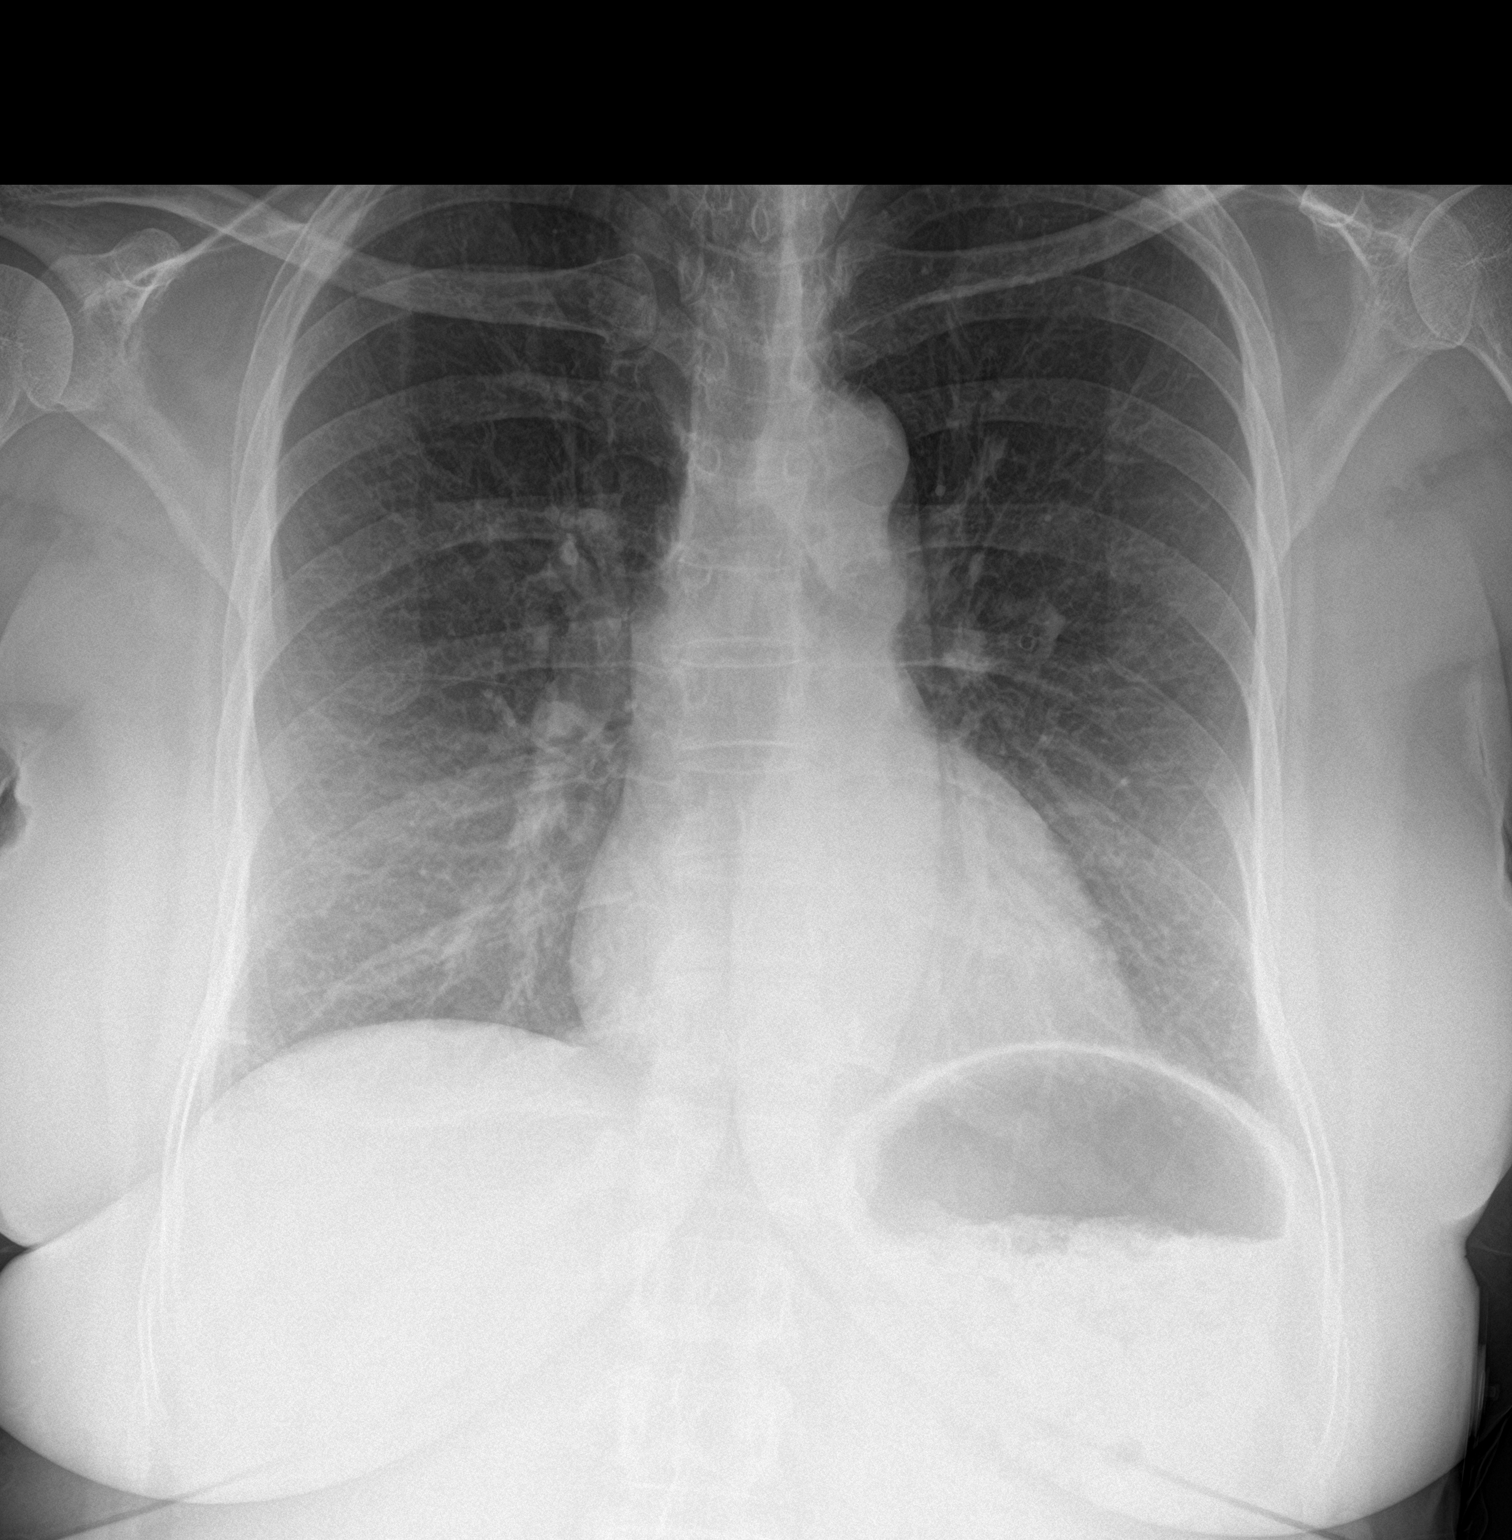

[chest lat]
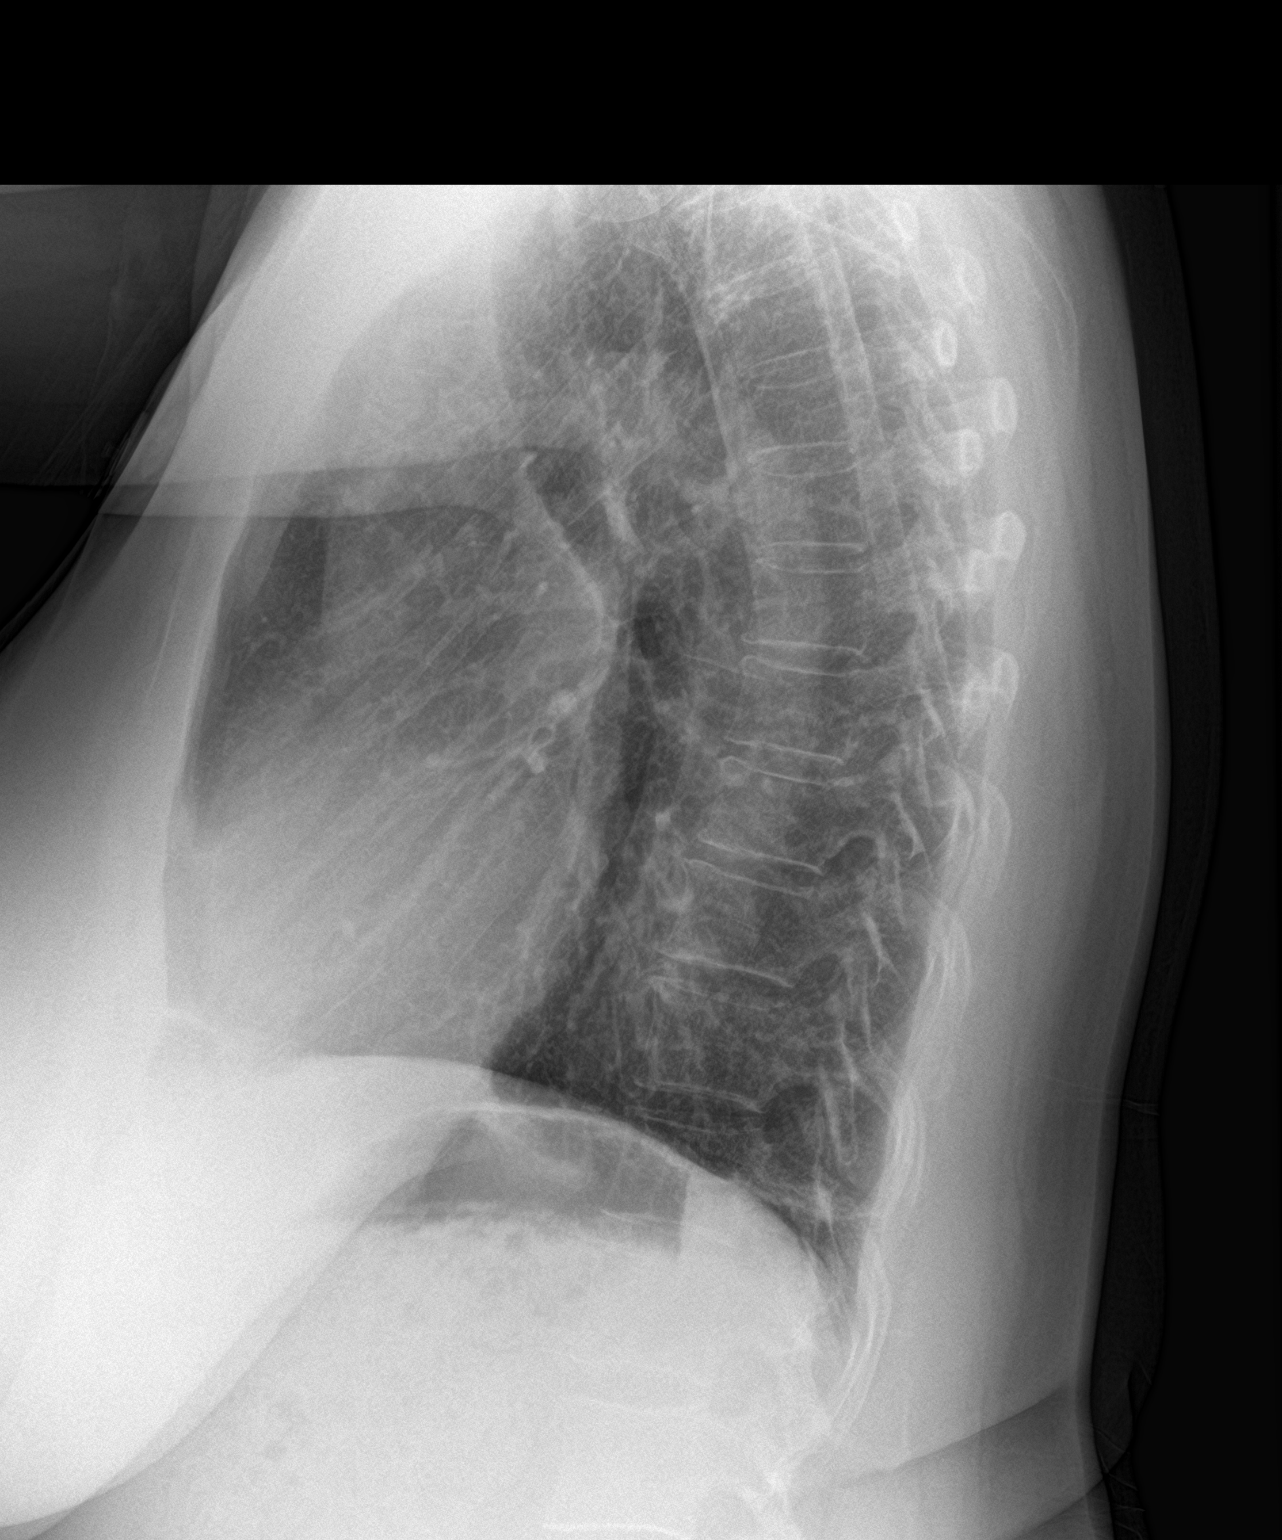

[2 of 2 positions shown; findings below may reference images not displayed]

FINDINGS: No active infiltrate or effusion is seen. Mild biapical pleural
thickening is present. Mediastinal and hilar contours are
unremarkable. The heart is within normal limits in size. No acute
bony abnormality is seen.
IMPRESSION: No active cardiopulmonary disease.

## 2018-08-18 ENCOUNTER — Emergency Department (HOSPITAL_COMMUNITY)
Admission: EM | Admit: 2018-08-18 | Discharge: 2018-08-18 | Disposition: A | Discharge status: Home / Self Care | Payer: Medicaid - Out of State | Attending: Emergency Medicine | Admitting: Emergency Medicine

## 2018-08-18 ENCOUNTER — Encounter (HOSPITAL_COMMUNITY): Payer: Self-pay | Admitting: Emergency Medicine

## 2018-08-18 ENCOUNTER — Other Ambulatory Visit: Payer: Self-pay

## 2018-08-18 ENCOUNTER — Emergency Department (HOSPITAL_COMMUNITY): Payer: Medicaid - Out of State

## 2018-08-18 DIAGNOSIS — I509 Heart failure, unspecified: Secondary | ICD-10-CM | POA: Diagnosis not present

## 2018-08-18 DIAGNOSIS — Z79899 Other long term (current) drug therapy: Secondary | ICD-10-CM | POA: Insufficient documentation

## 2018-08-18 DIAGNOSIS — I110 Hypertensive heart disease with heart failure: Secondary | ICD-10-CM | POA: Diagnosis not present

## 2018-08-18 DIAGNOSIS — Z7982 Long term (current) use of aspirin: Secondary | ICD-10-CM | POA: Insufficient documentation

## 2018-08-18 DIAGNOSIS — R1030 Lower abdominal pain, unspecified: Secondary | ICD-10-CM | POA: Diagnosis present

## 2018-08-18 DIAGNOSIS — K529 Noninfective gastroenteritis and colitis, unspecified: Secondary | ICD-10-CM | POA: Insufficient documentation

## 2018-08-18 DIAGNOSIS — K760 Fatty (change of) liver, not elsewhere classified: Secondary | ICD-10-CM

## 2018-08-18 DIAGNOSIS — E119 Type 2 diabetes mellitus without complications: Secondary | ICD-10-CM | POA: Diagnosis not present

## 2018-08-18 DIAGNOSIS — Z794 Long term (current) use of insulin: Secondary | ICD-10-CM | POA: Insufficient documentation

## 2018-08-18 DIAGNOSIS — F17210 Nicotine dependence, cigarettes, uncomplicated: Secondary | ICD-10-CM | POA: Insufficient documentation

## 2018-08-18 LAB — CBC
HCT: 43.5 % (ref 36.0–46.0)
Hemoglobin: 13.6 g/dL (ref 12.0–15.0)
MCH: 27.3 pg (ref 26.0–34.0)
MCHC: 31.3 g/dL (ref 30.0–36.0)
MCV: 87.2 fL (ref 80.0–100.0)
Platelets: 268 10*3/uL (ref 150–400)
RBC: 4.99 MIL/uL (ref 3.87–5.11)
RDW: 13.8 % (ref 11.5–15.5)
WBC: 8.2 10*3/uL (ref 4.0–10.5)
nRBC: 0 % (ref 0.0–0.2)

## 2018-08-18 LAB — URINALYSIS, ROUTINE W REFLEX MICROSCOPIC
Bilirubin Urine: NEGATIVE
Glucose, UA: NEGATIVE mg/dL
Hgb urine dipstick: NEGATIVE
Ketones, ur: NEGATIVE mg/dL
Leukocytes,Ua: NEGATIVE
Nitrite: NEGATIVE
Protein, ur: NEGATIVE mg/dL
Specific Gravity, Urine: 1.028 (ref 1.005–1.030)
pH: 5 (ref 5.0–8.0)

## 2018-08-18 LAB — I-STAT BETA HCG BLOOD, ED (MC, WL, AP ONLY): I-stat hCG, quantitative: <5 m[IU]/mL (ref <5)

## 2018-08-18 LAB — COMPREHENSIVE METABOLIC PANEL
ALT: 25 U/L (ref 0–44)
AST: 19 U/L (ref 15–41)
Albumin: 4.2 g/dL (ref 3.5–5.0)
Alkaline Phosphatase: 100 U/L (ref 38–126)
Anion gap: 9 (ref 5–15)
BUN: 16 mg/dL (ref 6–20)
CO2: 23 mmol/L (ref 22–32)
Calcium: 9.2 mg/dL (ref 8.9–10.3)
Chloride: 104 mmol/L (ref 98–111)
Creatinine, Ser: 0.7 mg/dL (ref 0.44–1.00)
GFR calc Af Amer: >60 mL/min (ref >60)
GFR calc non Af Amer: >60 mL/min (ref >60)
Glucose, Bld: 198 mg/dL — ABNORMAL HIGH (ref 70–99)
Potassium: 3.9 mmol/L (ref 3.5–5.1)
Sodium: 136 mmol/L (ref 135–145)
Total Bilirubin: 0.9 mg/dL (ref 0.3–1.2)
Total Protein: 8 g/dL (ref 6.5–8.1)

## 2018-08-18 LAB — LIPASE, BLOOD: Lipase: 21 U/L (ref 11–51)

## 2018-08-18 MED ORDER — MORPHINE SULFATE (PF) 4 MG/ML IV SOLN
4.0000 mg | Freq: Once | INTRAVENOUS | Status: AC
  Administered 2018-08-18: 4 mg via INTRAVENOUS
  Filled 2018-08-18: qty 1

## 2018-08-18 MED ORDER — SODIUM CHLORIDE 0.9% FLUSH
3.0000 mL | Freq: Once | INTRAVENOUS | Status: AC
  Administered 2018-08-18: 3 mL via INTRAVENOUS

## 2018-08-18 MED ORDER — DICYCLOMINE HCL 20 MG PO TABS: 20.0000 mg | ORAL_TABLET | Freq: Three times a day (TID) | ORAL | 0 refills | Status: AC

## 2018-08-18 MED ORDER — SODIUM CHLORIDE 0.9 % IV BOLUS
500.0000 mL | Freq: Once | INTRAVENOUS | Status: AC
  Administered 2018-08-18: 500 mL via INTRAVENOUS

## 2018-08-18 MED ORDER — ONDANSETRON 4 MG PO TBDP: 4.0000 mg | ORAL_TABLET | Freq: Three times a day (TID) | ORAL | 0 refills | Status: AC | PRN

## 2018-08-18 MED ORDER — ONDANSETRON 4 MG PO TBDP: 4.0000 mg | ORAL_TABLET | Freq: Three times a day (TID) | ORAL | 0 refills | Status: DC | PRN

## 2018-08-18 MED ORDER — SODIUM CHLORIDE (PF) 0.9 % IJ SOLN
INTRAMUSCULAR | Status: AC
  Filled 2018-08-18: qty 50

## 2018-08-18 MED ORDER — IOHEXOL 300 MG/ML  SOLN
100.0000 mL | Freq: Once | INTRAMUSCULAR | Status: AC | PRN
  Administered 2018-08-18: 100 mL via INTRAVENOUS

## 2018-08-18 NOTE — ED Triage Notes (Signed)
Patient here from home with complaints of abd cramping/ pain below the belly button that started at 2 hours ago. Denies n/v.

## 2018-08-18 NOTE — Discharge Instructions (Addendum)
I have given you information on viral gastroenteritis as that is the most common cause of any enteritis.    Please make sure you are staying well hydrated.   Your CT scan showed a cyst on our kidney and hepatic steatosis which is fatty liver.  Please follow-up with your primary care doctor on both of these and your stomach pain.

## 2018-08-18 NOTE — ED Notes (Signed)
Patient transported to CT 

## 2018-08-18 NOTE — ED Notes (Signed)
Bed: WA08 Expected date:  Expected time:  Means of arrival:  Comments: 

## 2018-08-18 NOTE — ED Provider Notes (Signed)
[facility] DEPT Provider Note   CSN: [id number] Arrival date & time: 08/18/18  1324    History   Chief Complaint Chief Complaint  Patient presents with  . Abdominal Pain    HPI Robin Pace is a 53 y.o. female with a past medical history of CHF, DM, fibromyalgia, hypertension, abdominal hernia, dilated cardiomyopathy, postmenopausal/bilateral tubal ligation, who presents today for evaluation of abdominal pain and cramping.  She reports that approximately 3 hours ago she started having severe cramping abdominal pain below her umbilicus.  Initially this was infrequent, when the pain starts it will last for approximately 30 seconds before fully going away however has been becoming more and more constant.  She says that the pain is over her scar from her previous ventral abdominal hernia/diastases recti repair.  She reports her last bowel movement was this morning and was normal for her.  She denies any nausea vomiting or diarrhea.  There is no blood in her bowel movement.  She denies any dysuria, increased frequency or urgency.  No cough, chest pain, or shortness of breath.     HPI  Past Medical History:  Diagnosis Date  . Abdominal hernia   . Anemia   . CHF (congestive heart failure) ([place])   . Diabetes mellitus without complication ([place])   . Fibromyalgia   . Fibromyalgia   . Headache   . Hypertension   . Numbness on right side   . Wears dentures    top    Patient Active Problem List   Diagnosis Date Noted  . Dilated cardiomyopathy ([place]) 09/07/2017  . Hyperglycemia 07/06/2017  . Tachycardia 07/06/2017  . Diabetes mellitus without complication ([place]) 83/41/9622  . Hypertension 07/06/2017  . Neck pain on right side 09/10/2013  . Low back pain 09/10/2013  . Headache   . Numbness on right side     Past Surgical History:  Procedure Laterality Date  . ABDOMINOPLASTY N/A 08/12/2013   Procedure: REPAIR OF SEVERE DIASTASIS RECTI/VENTRAL HERNIA  OF ABDOMEN;  Surgeon: Cristine Polio, MD;  Location: [facility];  Service: Plastics;  Laterality: N/A;  . CESAREAN SECTION    . HERNIA REPAIR    . MASS EXCISION Right 08/06/2012   Procedure: EXCISION OF LARGE MASS right FLANK WITH LIPO ASSISTANCE;  Surgeon: Cristine Polio, MD;  Location: [facility];  Service: Plastics;  Laterality: Right;  . TUBAL LIGATION       OB History    Gravida  2   Para  2   Term  1   Preterm  1   AB      Living  2     SAB      TAB      Ectopic      Multiple      Live Births               Home Medications    Prior to Admission medications   Medication Sig Start Date End Date Taking? Authorizing Provider  aspirin 81 MG chewable tablet Chew 81 mg by mouth daily. 11/26/15   [provider]  atorvastatin (LIPITOR) 40 MG tablet Take 1 tablet (40 mg total) by mouth daily. MUST MAKE APPT FOR FURTHER REFILLS 04/16/18   Gildardo Pounds, NP  blood glucose meter kit and supplies KIT Dispense based on patient and insurance preference. Use up to four times daily as directed. (FOR ICD-9 250.00, 250.01). 07/10/17   Domenic Polite, MD  CALCIUM PO  Take 1 tablet by mouth daily.    [provider]  carvedilol (COREG) 6.25 MG tablet Take 1 tablet (6.25 mg total) by mouth 2 (two) times daily with a meal. MUST MAKE APPT FOR FURTHER REFILLS 04/16/18   Gildardo Pounds, NP  dicyclomine (BENTYL) 20 MG tablet Take 1 tablet (20 mg total) by mouth 4 (four) times daily -  before meals and at bedtime. 08/18/18   Lorin Glass, PA-C  furosemide (LASIX) 40 MG tablet Take 0.5 tablets (20 mg total) by mouth daily. MUST MAKE APPT FOR FURTHER REFILLS 04/16/18   Gildardo Pounds, NP  glimepiride (AMARYL) 2 MG tablet Take 1 tablet (2 mg total) by mouth daily with breakfast. MUST MAKE APPT FOR FURTHER REFILLS 04/16/18   Gildardo Pounds, NP  glucose blood (TRUE METRIX BLOOD GLUCOSE TEST) test strip Use as instructed 09/05/17    Gildardo Pounds, NP  Insulin Glargine (LANTUS) 100 UNIT/ML Solostar Pen Inject 45 Units into the skin every morning. MUST MAKE APPT FOR FURTHER REFILLS 04/16/18   Gildardo Pounds, NP  Insulin Pen Needle (B-D UF III MINI PEN NEEDLES) 31G X 5 MM MISC Use as instructed 04/16/18   Gildardo Pounds, NP  Insulin Syringe-Needle U-100 (TRUEPLUS INSULIN SYRINGE) 31G X 5/16" 0.5 ML MISC Use as directed to administer insulin 04/16/18   Gildardo Pounds, NP  losartan (COZAAR) 25 MG tablet Take 1 tablet (25 mg total) by mouth daily. 09/05/17   Gildardo Pounds, NP  meclizine (ANTIVERT) 25 MG tablet Take 1 tablet (25 mg total) by mouth 3 (three) times daily as needed for dizziness. 01/10/18   Drenda Freeze, MD  meloxicam (MOBIC) 7.5 MG tablet Take 1 tablet (7.5 mg total) by mouth daily. Prn pain Patient not taking: Reported on 12/26/2017 09/20/17   Argentina Donovan, PA-C  omeprazole (PRILOSEC) 20 MG capsule Take 1 capsule (20 mg total) by mouth 2 (two) times daily before a meal. For throat issues Patient not taking: Reported on 12/19/2017 09/20/17   Argentina Donovan, PA-C  ondansetron (ZOFRAN ODT) 4 MG disintegrating tablet Take 1 tablet (4 mg total) by mouth every 8 (eight) hours as needed for nausea or vomiting. 08/18/18   Lorin Glass, PA-C  spironolactone (ALDACTONE) 25 MG tablet Take by mouth. 09/30/16   [provider]  TRUEPLUS LANCETS 28G MISC Use as instructed 09/05/17   Gildardo Pounds, NP  Vitamin D, Ergocalciferol, (DRISDOL) 50000 units CAPS capsule Take 1 capsule (50,000 Units total) by mouth every 7 (seven) days. 09/21/17   Argentina Donovan, PA-C    Family History Family History  Problem Relation Age of Onset  . Hypertension Mother   . Diabetes Mother   . Heart disease Father   . Heart disease Brother   . Colon cancer Brother   . Esophageal cancer Neg Hx   . Rectal cancer Neg Hx   . Stomach cancer Neg Hx     Social History Social History   Tobacco Use  . Smoking  status: Current Every Day Smoker    Packs/day: 0.25    Years: 30.00    Pack years: 7.50    Types: Cigarettes  . Smokeless tobacco: Never Used  Substance Use Topics  . Alcohol use: Yes  . Drug use: No     Allergies   Patient has no known allergies.   Review of Systems Review of Systems  Constitutional: Negative for chills and fever.  HENT: Negative for  congestion.   Respiratory: Negative for chest tightness and shortness of breath.   Gastrointestinal: Positive for abdominal pain. Negative for blood in stool, constipation, diarrhea, nausea and vomiting.  Genitourinary: Negative for decreased urine volume, dysuria, flank pain, frequency, pelvic pain, urgency, vaginal bleeding, vaginal discharge and vaginal pain.  Musculoskeletal: Negative for back pain and neck pain.  Neurological: Negative for weakness and headaches.  All other systems reviewed and are negative.    Physical Exam Updated Vital Signs BP 122/81   Pulse 98   Temp 98.2 F (36.8 C) (Oral)   Resp (!) 23   LMP 05/08/2013   SpO2 99%   Physical Exam Vitals signs and nursing note reviewed.  Constitutional:      General: She is not in acute distress.    Appearance: She is well-developed.  HENT:     Head: Normocephalic and atraumatic.  Eyes:     Conjunctiva/sclera: Conjunctivae normal.  Neck:     Musculoskeletal: Neck supple.  Cardiovascular:     Rate and Rhythm: Normal rate and regular rhythm.     Heart sounds: Normal heart sounds. No murmur.  Pulmonary:     Effort: Pulmonary effort is normal. No respiratory distress.     Breath sounds: Normal breath sounds.  Abdominal:     General: Abdomen is flat. A surgical scar is present. Bowel sounds are normal. There is no distension.     Palpations: Abdomen is soft.     Tenderness: There is abdominal tenderness (Periumbilical, primarily located infraumbilical along her previous surgical scar.  No tenderness to light palpation, tenderness is only present to deep  palpation.). There is no guarding or rebound.     Hernia: No hernia is present.     Comments: While in room patient had an episode of pain, she started crying quietly, grabbed her stomach below umbilicus.  No change in physical exam.   Skin:    General: Skin is warm and dry.  Neurological:     Mental Status: She is alert.     Cranial Nerves: No cranial nerve deficit.  Psychiatric:        Mood and Affect: Mood normal. Mood is not anxious.        Behavior: Behavior normal.      ED Treatments / Results  Labs (all labs ordered are listed, but only abnormal results are displayed) Labs Reviewed  COMPREHENSIVE METABOLIC PANEL - Abnormal; Notable for the following components:      Result Value   Glucose, Bld 198 (*)    All other components within normal limits  LIPASE, BLOOD  CBC  URINALYSIS, ROUTINE W REFLEX MICROSCOPIC  I-STAT BETA HCG BLOOD, ED ([facility], [facility], [facility] ONLY)    EKG EKG Interpretation  Date/Time:  Saturday August 18 2018 14:30:05 EDT Ventricular Rate:  97 PR Interval:    QRS Duration: 95 QT Interval:  396 QTC Calculation: 504 R Axis:   71 Text Interpretation:  Sinus rhythm Borderline prolonged PR interval Probable left ventricular hypertrophy Borderline T abnormalities, lateral leads Borderline prolonged QT interval since last tracing no significant change Confirmed by Daleen Bo [id number]) on 08/18/2018 2:50:07 PM   Radiology Ct Abdomen Pelvis W Contrast  Result Date: 08/18/2018 CLINICAL DATA:  53 year old female with acute abdominal and pelvic pain today. EXAM: CT ABDOMEN AND PELVIS WITH CONTRAST TECHNIQUE: Multidetector CT imaging of the abdomen and pelvis was performed using the standard protocol following bolus administration of intravenous contrast. CONTRAST:  171m OMNIPAQUE IOHEXOL 300 MG/ML  SOLN COMPARISON:  03/18/2017 and prior CTs FINDINGS: Lower chest: No acute abnormality. Hepatobiliary: Mild hepatic steatosis identified. No focal hepatic abnormalities are  present. The gallbladder is unremarkable. No biliary dilatation. Pancreas: Unremarkable Spleen: Unremarkable Adrenals/Urinary Tract: The kidneys, adrenal glands and bladder are unremarkable except for a RIGHT renal cyst. Stomach/Bowel: Equivocal mild circumferential wall thickening of small bowel loops within the pelvis are noted and may represent enteritis. There is no evidence of bowel obstruction. No other inflammatory changes are noted. The appendix appears unremarkable. Vascular/Lymphatic: No significant vascular findings are present. No enlarged abdominal or pelvic lymph nodes. Reproductive: Uterus and bilateral adnexa are unremarkable. Other: No ascites, abscess or pneumoperitoneum. Musculoskeletal: No acute or suspicious bony abnormalities. IMPRESSION: 1. Equivocal mild circumferential wall thickening of several pelvic small bowel loops which may represent an enteritis. No evidence of bowel obstruction. 2. Mild hepatic steatosis Electronically Signed   By: Margarette Canada M.D.   On: 08/18/2018 15:30    Procedures Procedures (including critical care time)  Medications Ordered in ED Medications  sodium chloride flush (NS) 0.9 % injection 3 mL (3 mLs Intravenous Given 08/18/18 1430)  sodium chloride 0.9 % bolus 500 mL (0 mLs Intravenous Stopped 08/18/18 1616)  morphine 4 MG/ML injection 4 mg (4 mg Intravenous Given 08/18/18 1504)  iohexol (OMNIPAQUE) 300 MG/ML solution 100 mL (100 mLs Intravenous Contrast Given 08/18/18 1446)     Initial Impression / Assessment and Plan / ED Course  I have reviewed the triage vital signs and the nursing notes.  Pertinent labs & imaging results that were available during my care of the patient were reviewed by me and considered in my medical decision making (see chart for details).        Patient presents today for evaluation of abdominal pain.  Her pain is intermittent in nature, located below her umbilicus.  She is nontoxic and nonseptic appearing.  She does not  meet Sirs or sepsis criteria.  She is not consistently tachycardic and is afebrile.  Labs are obtained and reviewed without evidence of acute hematologic or electrolyte abnormalities.  CT scan was ordered showing nonspecific thickening of the small bowel consistent with enteritis.  Recommended supportive care.  She is given prescriptions for antiemetics, and Bentyl as needed to help with discomfort.  Recommended increasing p.o. fluids and bland diet.  She is postmenopausal, do not suspect ectopic pregnancy.  She denies vaginal discharge, do not suspect PID.  Return precautions were discussed with patient who states their understanding.  At the time of discharge patient denied any unaddressed complaints or concerns.  Patient is agreeable for discharge home.   Final Clinical Impressions(s) / ED Diagnoses   Final diagnoses:  Lower abdominal pain  Enteritis  Hepatic steatosis    ED Discharge Orders         Ordered    ondansetron (ZOFRAN ODT) 4 MG disintegrating tablet  Every 8 hours PRN,   Status:  Discontinued     08/18/18 1548    dicyclomine (BENTYL) 20 MG tablet  3 times daily before meals & bedtime     08/18/18 1548    ondansetron (ZOFRAN ODT) 4 MG disintegrating tablet  Every 8 hours PRN     08/18/18 1549           Lorin Glass, Hershal Coria 08/18/18 2156    Daleen Bo, MD 08/19/18 1031

## 2020-07-22 ENCOUNTER — Encounter (HOSPITAL_COMMUNITY): Payer: Self-pay

## 2020-07-22 ENCOUNTER — Other Ambulatory Visit: Payer: Self-pay

## 2020-07-22 ENCOUNTER — Emergency Department (HOSPITAL_COMMUNITY)
Admission: EM | Admit: 2020-07-22 | Discharge: 2020-07-22 | Disposition: A | Discharge status: Home / Self Care | Payer: Medicaid - Out of State | Attending: Emergency Medicine | Admitting: Emergency Medicine

## 2020-07-22 DIAGNOSIS — M5441 Lumbago with sciatica, right side: Secondary | ICD-10-CM | POA: Diagnosis not present

## 2020-07-22 DIAGNOSIS — Z7982 Long term (current) use of aspirin: Secondary | ICD-10-CM | POA: Diagnosis not present

## 2020-07-22 DIAGNOSIS — Z79899 Other long term (current) drug therapy: Secondary | ICD-10-CM | POA: Diagnosis not present

## 2020-07-22 DIAGNOSIS — Z7984 Long term (current) use of oral hypoglycemic drugs: Secondary | ICD-10-CM | POA: Insufficient documentation

## 2020-07-22 DIAGNOSIS — F17210 Nicotine dependence, cigarettes, uncomplicated: Secondary | ICD-10-CM | POA: Diagnosis not present

## 2020-07-22 DIAGNOSIS — E119 Type 2 diabetes mellitus without complications: Secondary | ICD-10-CM | POA: Insufficient documentation

## 2020-07-22 DIAGNOSIS — I509 Heart failure, unspecified: Secondary | ICD-10-CM | POA: Diagnosis not present

## 2020-07-22 DIAGNOSIS — Z794 Long term (current) use of insulin: Secondary | ICD-10-CM | POA: Diagnosis not present

## 2020-07-22 DIAGNOSIS — I110 Hypertensive heart disease with heart failure: Secondary | ICD-10-CM | POA: Insufficient documentation

## 2020-07-22 DIAGNOSIS — M545 Low back pain, unspecified: Secondary | ICD-10-CM | POA: Diagnosis present

## 2020-07-22 MED ORDER — KETOROLAC TROMETHAMINE 30 MG/ML IJ SOLN
30.0000 mg | Freq: Once | INTRAMUSCULAR | Status: AC
  Administered 2020-07-22: 30 mg via INTRAMUSCULAR
  Filled 2020-07-22: qty 1

## 2020-07-22 MED ORDER — LIDOCAINE 5 % EX PTCH: 1.0000 | MEDICATED_PATCH | CUTANEOUS | 0 refills | Status: AC

## 2020-07-22 MED ORDER — CYCLOBENZAPRINE HCL 10 MG PO TABS: 10.0000 mg | ORAL_TABLET | Freq: Three times a day (TID) | ORAL | 0 refills | Status: DC | PRN

## 2020-07-22 NOTE — ED Triage Notes (Addendum)
Pt reports lower back pain that radiates down right leg on going for 2 days.

## 2020-07-22 NOTE — ED Provider Notes (Signed)
[facility] DEPT Provider Note   CSN: [id number] Arrival date & time: 07/22/20  0321     History Chief Complaint  Patient presents with  . Back Pain    Robin Pace is a 55 y.o. female.  Patient presents to the emergency department with a chief complaint of low back pain.  She reports having had the pain for the past couple of days.  She states that the pain was worse when she was bent over.  She states that the pain radiates down her right leg.  She denies bowel or bladder incontinence.  Denies any fevers or chills.  She denies any weakness.  Denies trouble with walking.  She has tried taking ibuprofen with some relief.  She denies any trauma or injury.  The history is provided by the patient. No language interpreter was used.       Past Medical History:  Diagnosis Date  . Abdominal hernia   . Anemia   . CHF (congestive heart failure) ([place])   . Diabetes mellitus without complication ([place])   . Fibromyalgia   . Fibromyalgia   . Headache   . Hypertension   . Numbness on right side   . Wears dentures    top    Patient Active Problem List   Diagnosis Date Noted  . Dilated cardiomyopathy ([place]) 09/07/2017  . Hyperglycemia 07/06/2017  . Tachycardia 07/06/2017  . Diabetes mellitus without complication ([place]) 56/21/3086  . Hypertension 07/06/2017  . Neck pain on right side 09/10/2013  . Low back pain 09/10/2013  . Headache   . Numbness on right side     Past Surgical History:  Procedure Laterality Date  . ABDOMINOPLASTY N/A 08/12/2013   Procedure: 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;  Surgeon: Cristine Polio, MD;  Location: [facility];  Service: Plastics;  Laterality: N/A;  . CESAREAN SECTION    . HERNIA REPAIR    . MASS EXCISION Right 08/06/2012   Procedure: EXCISION OF LARGE MASS right FLANK WITH LIPO ASSISTANCE;  Surgeon: Cristine Polio, MD;  Location: [facility];  Service:  Plastics;  Laterality: Right;  . TUBAL LIGATION       OB History    Gravida  2   Para  2   Term  1   Preterm  1   AB      Living  2     SAB      IAB      Ectopic      Multiple      Live Births              Family History  Problem Relation Age of Onset  . Hypertension Mother   . Diabetes Mother   . Heart disease Father   . Heart disease Brother   . Colon cancer Brother   . Esophageal cancer Neg Hx   . Rectal cancer Neg Hx   . Stomach cancer Neg Hx     Social History   Tobacco Use  . Smoking status: Current Every Day Smoker    Packs/day: 0.25    Years: 30.00    Pack years: 7.50    Types: Cigarettes  . Smokeless tobacco: Never Used  Vaping Use  . Vaping Use: Never used  Substance Use Topics  . Alcohol use: Yes  . Drug use: No    Home Medications Prior to Admission medications   Medication Sig Start Date End Date Taking? Authorizing Provider  aspirin 81 MG chewable tablet Chew 81 mg by mouth daily. 11/26/15   [provider]  atorvastatin (LIPITOR) 40 MG tablet Take 1 tablet (40 mg total) by mouth daily. MUST MAKE APPT FOR FURTHER REFILLS 04/16/18   Gildardo Pounds, NP  blood glucose meter kit and supplies KIT Dispense based on patient and insurance preference. Use up to four times daily as directed. (FOR ICD-9 250.00, 250.01). 07/10/17   Domenic Polite, MD  CALCIUM PO Take 1 tablet by mouth daily.    [provider]  carvedilol (COREG) 6.25 MG tablet Take 1 tablet (6.25 mg total) by mouth 2 (two) times daily with a meal. MUST MAKE APPT FOR FURTHER REFILLS 04/16/18   Gildardo Pounds, NP  dicyclomine (BENTYL) 20 MG tablet Take 1 tablet (20 mg total) by mouth 4 (four) times daily -  before meals and at bedtime. 08/18/18   Lorin Glass, PA-C  furosemide (LASIX) 40 MG tablet Take 0.5 tablets (20 mg total) by mouth daily. MUST MAKE APPT FOR FURTHER REFILLS 04/16/18   Gildardo Pounds, NP  glimepiride (AMARYL) 2 MG tablet Take 1  tablet (2 mg total) by mouth daily with breakfast. MUST MAKE APPT FOR FURTHER REFILLS 04/16/18   Gildardo Pounds, NP  glucose blood (TRUE METRIX BLOOD GLUCOSE TEST) test strip Use as instructed 09/05/17   Gildardo Pounds, NP  Insulin Glargine (LANTUS) 100 UNIT/ML Solostar Pen Inject 45 Units into the skin every morning. MUST MAKE APPT FOR FURTHER REFILLS 04/16/18   Gildardo Pounds, NP  Insulin Pen Needle (B-D UF III MINI PEN NEEDLES) 31G X 5 MM MISC Use as instructed 04/16/18   Gildardo Pounds, NP  Insulin Syringe-Needle U-100 (TRUEPLUS INSULIN SYRINGE) 31G X 5/16" 0.5 ML MISC Use as directed to administer insulin 04/16/18   Gildardo Pounds, NP  losartan (COZAAR) 25 MG tablet Take 1 tablet (25 mg total) by mouth daily. 09/05/17   Gildardo Pounds, NP  meclizine (ANTIVERT) 25 MG tablet Take 1 tablet (25 mg total) by mouth 3 (three) times daily as needed for dizziness. 01/10/18   Drenda Freeze, MD  meloxicam (MOBIC) 7.5 MG tablet Take 1 tablet (7.5 mg total) by mouth daily. Prn pain Patient not taking: Reported on 12/26/2017 09/20/17   Argentina Donovan, PA-C  omeprazole (PRILOSEC) 20 MG capsule Take 1 capsule (20 mg total) by mouth 2 (two) times daily before a meal. For throat issues Patient not taking: Reported on 12/19/2017 09/20/17   Argentina Donovan, PA-C  ondansetron (ZOFRAN ODT) 4 MG disintegrating tablet Take 1 tablet (4 mg total) by mouth every 8 (eight) hours as needed for nausea or vomiting. 08/18/18   Lorin Glass, PA-C  spironolactone (ALDACTONE) 25 MG tablet Take by mouth. 09/30/16   [provider]  TRUEPLUS LANCETS 28G MISC Use as instructed 09/05/17   Gildardo Pounds, NP  Vitamin D, Ergocalciferol, (DRISDOL) 50000 units CAPS capsule Take 1 capsule (50,000 Units total) by mouth every 7 (seven) days. 09/21/17   Argentina Donovan, PA-C    Allergies    No known allergies  Review of Systems   Review of Systems  All other systems reviewed and are negative.   Physical  Exam Updated Vital Signs BP (!) 145/103 (BP Location: Right Arm)   Pulse 99   Temp 98 F (36.7 C) (Oral)   Ht _0  (1.727 m)   Wt 79.4 kg   LMP 05/08/2013   SpO2  96%   BMI 26.61 kg/m   Physical Exam Physical Exam  Constitutional: Pt appears well-developed and well-nourished. No distress.  HENT:  Head: Normocephalic and atraumatic.  Mouth/Throat: Oropharynx is clear and moist. No oropharyngeal exudate.  Eyes: Conjunctivae are normal.  Neck: Normal range of motion. Neck supple.  No meningismus Cardiovascular: Normal rate, regular rhythm and intact distal pulses.   Pulmonary/Chest: Effort normal and breath sounds normal. No respiratory distress. Pt has no wheezes.  Abdominal: Pt exhibits no distension Musculoskeletal:   No focal tenderness to palpation, no bony CTLS spine tenderness, deformity, step-off, or crepitus Lymphadenopathy: Pt has no cervical adenopathy.  Neurological: Pt is alert and oriented Speech is clear and goal oriented, follows commands Normal 5/5 strength in upper and lower extremities bilaterally including dorsiflexion and plantar flexion Sensation intact Great toe extension intact Moves extremities without ataxia, coordination intact Normal gait Normal balance No Clonus Skin: Skin is warm and dry. No rash noted. Pt is not diaphoretic. No erythema.  Psychiatric: Pt has a normal mood and affect. Behavior is normal.  Nursing note and vitals reviewed.  ED Results / Procedures / Treatments   Labs (all labs ordered are listed, but only abnormal results are displayed) Labs Reviewed - No data to display  EKG None  Radiology No results found.  Procedures Procedures   Medications Ordered in ED Medications  ketorolac (TORADOL) 30 MG/ML injection 30 mg (has no administration in time range)    ED Course  I have reviewed the triage vital signs and the nursing notes.  Pertinent labs & imaging results that were available during my care of the patient  were reviewed by me and considered in my medical decision making (see chart for details).    MDM Rules/Calculators/A&P                          Patient with back pain.    No neurological deficits and normal neuro exam.  Patient is ambulatory.  No loss of bowel or bladder control.  Doubt cauda equina.  Denies fever,  doubt epidural abscess or other lesion. Recommend back exercises, stretching, RICE, and will treat with a short course of flexeril and lidoderm.  Consultants: none  Encouraged the patient that there could be a need for additional workup and/or imaging such as MRI, if the symptoms do not resolve. Patient advised that if the back pain does not resolve, or radiates, this could progress to more serious conditions and is encouraged to follow-up with PCP or orthopedics within 2 weeks.    Final Clinical Impression(s) / ED Diagnoses Final diagnoses:  Acute midline low back pain with right-sided sciatica    Rx / DC Orders ED Discharge Orders         Ordered    cyclobenzaprine (FLEXERIL) 10 MG tablet  3 times daily PRN        07/22/20 0411    lidocaine (LIDODERM) 5 %  Every 24 hours        07/22/20 0411           Montine Circle, PA-C 07/22/20 [place], [place], DO 07/22/20 [phone]

## 2021-09-05 ENCOUNTER — Emergency Department (HOSPITAL_COMMUNITY)
Admission: EM | Admit: 2021-09-05 | Discharge: 2021-09-05 | Disposition: A | Discharge status: Home / Self Care | Payer: No Typology Code available for payment source | Attending: Emergency Medicine | Admitting: Emergency Medicine

## 2021-09-05 ENCOUNTER — Emergency Department (HOSPITAL_COMMUNITY): Payer: No Typology Code available for payment source

## 2021-09-05 ENCOUNTER — Other Ambulatory Visit: Payer: Self-pay

## 2021-09-05 ENCOUNTER — Encounter (HOSPITAL_COMMUNITY): Payer: Self-pay | Admitting: Emergency Medicine

## 2021-09-05 DIAGNOSIS — Z7982 Long term (current) use of aspirin: Secondary | ICD-10-CM | POA: Insufficient documentation

## 2021-09-05 DIAGNOSIS — R519 Headache, unspecified: Secondary | ICD-10-CM | POA: Diagnosis not present

## 2021-09-05 DIAGNOSIS — M62830 Muscle spasm of back: Secondary | ICD-10-CM | POA: Insufficient documentation

## 2021-09-05 DIAGNOSIS — M545 Low back pain, unspecified: Secondary | ICD-10-CM | POA: Diagnosis not present

## 2021-09-05 DIAGNOSIS — Y92410 Unspecified street and highway as the place of occurrence of the external cause: Secondary | ICD-10-CM | POA: Diagnosis not present

## 2021-09-05 DIAGNOSIS — M62838 Other muscle spasm: Secondary | ICD-10-CM

## 2021-09-05 MED ORDER — CYCLOBENZAPRINE HCL 10 MG PO TABS
10.0000 mg | ORAL_TABLET | Freq: Two times a day (BID) | ORAL | 0 refills | Status: AC | PRN
Start: 2021-09-05 — End: ?

## 2021-09-05 MED ORDER — OXYCODONE HCL 5 MG PO TABS: 5.0000 mg | ORAL_TABLET | Freq: Four times a day (QID) | ORAL | 0 refills | Status: AC | PRN

## 2021-09-05 MED ORDER — KETOROLAC TROMETHAMINE 60 MG/2ML IM SOLN
60.0000 mg | Freq: Once | INTRAMUSCULAR | Status: AC
  Administered 2021-09-05: 60 mg via INTRAMUSCULAR
  Filled 2021-09-05: qty 2
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
## 2021-09-05 NOTE — ED Notes (Signed)
Right wrist brace applied.  ?
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
## 2022-09-28 ENCOUNTER — Encounter: Payer: Self-pay | Admitting: Gastroenterology

## 2023-02-07 ENCOUNTER — Encounter (HOSPITAL_COMMUNITY): Payer: Self-pay

## 2023-02-07 ENCOUNTER — Ambulatory Visit (HOSPITAL_COMMUNITY)
Admission: EM | Admit: 2023-02-07 | Discharge: 2023-02-07 | Disposition: A | Discharge status: Home / Self Care | Payer: Medicaid - Out of State | Attending: Internal Medicine | Admitting: Internal Medicine

## 2023-02-07 DIAGNOSIS — E118 Type 2 diabetes mellitus with unspecified complications: Secondary | ICD-10-CM

## 2023-02-07 DIAGNOSIS — B370 Candidal stomatitis: Secondary | ICD-10-CM

## 2023-02-07 MED ORDER — NYSTATIN 100000 UNIT/ML MT SUSP: 500000.0000 [IU] | Freq: Four times a day (QID) | OROMUCOSAL | 0 refills | Status: AC

## 2023-02-07 NOTE — ED Triage Notes (Signed)
Pt states for the past month her mouth has been dry and she has sores in her mouth.  Was treated with Magik mouthwash for thrush but states she still is not better. Pt also states she is bringing  up a lot of phlegm  and is having left ear pain.

## 2023-02-07 NOTE — ED Provider Notes (Signed)
[facility]    CSN: [id number] Arrival date & time: 02/07/23  1039      History   Chief Complaint Chief Complaint  Patient presents with   Oral Pain    HPI Robin Pace is a 57 y.o. female.   Patient presents to urgent care for evaluation of dry mouth and possible thrush that started approximately 1 month ago.  She was treated for thrush with Magic mouthwash in July 2024 and states that it "did not help at all".  Still experiencing white spots that she describes as sores to the mouth.  Sores are nontender.  Denies dental pain, sore throat, fevers.  History of type 2 diabetes, taking all medication as prescribed.  PCP in [place] manages diabetes.  She is not experiencing any signs or symptoms of hyperglycemia and denies confusion, tremor, nausea, vomiting, abdominal pain, diarrhea, dizziness, urinary symptoms, and rash.  Reports adequate fluid intake.  Current everyday cigarette smoker, denies other drug use.  Has not attempted use of any over-the-counter medications to help with symptoms prior to arrival.  She returns to [place] tomorrow.   Oral Pain    Past Medical History:  Diagnosis Date   Abdominal hernia    Anemia    CHF (congestive heart failure) (HCC)    Diabetes mellitus without complication (HCC)    Fibromyalgia    Fibromyalgia    Headache    Hypertension    Numbness on right side    Wears dentures    top    Patient Active Problem List   Diagnosis Date Noted   Dilated cardiomyopathy (HCC) 09/07/2017   Hyperglycemia 07/06/2017   Tachycardia 07/06/2017   Diabetes mellitus without complication (HCC) 07/06/2017   Hypertension 07/06/2017   Neck pain on right side 09/10/2013   Low back pain 09/10/2013   Headache    Numbness on right side     Past Surgical History:  Procedure Laterality Date   ABDOMINOPLASTY N/A 08/12/2013   Procedure: 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;  Surgeon: Louisa Second, MD;  Location: [facility];  Service: Plastics;  Laterality: N/A;   CESAREAN SECTION     HERNIA REPAIR     MASS EXCISION Right 08/06/2012   Procedure: EXCISION OF LARGE MASS right FLANK WITH LIPO ASSISTANCE;  Surgeon: Louisa Second, MD;  Location: [facility];  Service: Plastics;  Laterality: Right;   TUBAL LIGATION      OB History     Gravida  2   Para  2   Term  1   Preterm  1   AB      Living  2      SAB      IAB      Ectopic      Multiple      Live Births               Home Medications    Prior to Admission medications   Medication Sig Start Date End Date Taking? Authorizing Provider  nystatin (MYCOSTATIN) 100000 UNIT/ML suspension Take 5 mLs (500,000 Units total) by mouth 4 (four) times daily for 6 days. 02/07/23 02/13/23 Yes StanhopeDonavan Burnet, FNP  aspirin 81 MG chewable tablet Chew 81 mg by mouth daily. 11/26/15   [provider]  atorvastatin (LIPITOR) 40 MG tablet Take 1 tablet (40 mg total) by mouth daily. MUST MAKE APPT FOR FURTHER REFILLS 04/16/18   Claiborne Rigg, NP  blood  glucose meter kit and supplies KIT Dispense based on patient and insurance preference. Use up to four times daily as directed. (FOR ICD-9 250.00, 250.01). 07/10/17   Zannie Cove, MD  CALCIUM PO Take 1 tablet by mouth daily.    [provider]  carvedilol (COREG) 6.25 MG tablet Take 1 tablet (6.25 mg total) by mouth 2 (two) times daily with a meal. MUST MAKE APPT FOR FURTHER REFILLS 04/16/18   Claiborne Rigg, NP  cyclobenzaprine (FLEXERIL) 10 MG tablet Take 1 tablet (10 mg total) by mouth 2 (two) times daily as needed for muscle spasms. 09/05/21   Curatolo, Adam, DO  dicyclomine (BENTYL) 20 MG tablet Take 1 tablet (20 mg total) by mouth 4 (four) times daily -  before meals and at bedtime. 08/18/18   Cristina Gong, PA-C  furosemide (LASIX) 40 MG tablet Take 0.5 tablets (20 mg total) by mouth daily. MUST MAKE APPT FOR FURTHER REFILLS 04/16/18    Claiborne Rigg, NP  glimepiride (AMARYL) 2 MG tablet Take 1 tablet (2 mg total) by mouth daily with breakfast. MUST MAKE APPT FOR FURTHER REFILLS 04/16/18   Claiborne Rigg, NP  glucose blood (TRUE METRIX BLOOD GLUCOSE TEST) test strip Use as instructed 09/05/17   Claiborne Rigg, NP  Insulin Glargine (LANTUS) 100 UNIT/ML Solostar Pen Inject 45 Units into the skin every morning. MUST MAKE APPT FOR FURTHER REFILLS 04/16/18   Claiborne Rigg, NP  Insulin Pen Needle (B-D UF III MINI PEN NEEDLES) 31G X 5 MM MISC Use as instructed 04/16/18   Claiborne Rigg, NP  Insulin Syringe-Needle U-100 (TRUEPLUS INSULIN SYRINGE) 31G X 5/16" 0.5 ML MISC Use as directed to administer insulin 04/16/18   Claiborne Rigg, NP  lidocaine (LIDODERM) 5 % Place 1 patch onto the skin daily. Remove & Discard patch within 12 hours or as directed by MD 07/22/20   Roxy Horseman, PA-C  losartan (COZAAR) 25 MG tablet Take 1 tablet (25 mg total) by mouth daily. 09/05/17   Claiborne Rigg, NP  meclizine (ANTIVERT) 25 MG tablet Take 1 tablet (25 mg total) by mouth 3 (three) times daily as needed for dizziness. 01/10/18   Charlynne Pander, MD  meloxicam (MOBIC) 7.5 MG tablet Take 1 tablet (7.5 mg total) by mouth daily. Prn pain Patient not taking: Reported on 12/26/2017 09/20/17   Anders Simmonds, PA-C  omeprazole (PRILOSEC) 20 MG capsule Take 1 capsule (20 mg total) by mouth 2 (two) times daily before a meal. For throat issues Patient not taking: Reported on 12/19/2017 09/20/17   Anders Simmonds, PA-C  ondansetron (ZOFRAN ODT) 4 MG disintegrating tablet Take 1 tablet (4 mg total) by mouth every 8 (eight) hours as needed for nausea or vomiting. 08/18/18   Cristina Gong, PA-C  oxyCODONE (ROXICODONE) 5 MG immediate release tablet Take 1 tablet (5 mg total) by mouth every 6 (six) hours as needed for up to 10 doses for breakthrough pain. 09/05/21   Curatolo, Adam, DO  spironolactone (ALDACTONE) 25 MG tablet Take by mouth. 09/30/16    [provider]  TRUEPLUS LANCETS 28G MISC Use as instructed 09/05/17   Claiborne Rigg, NP  Vitamin D, Ergocalciferol, (DRISDOL) 50000 units CAPS capsule Take 1 capsule (50,000 Units total) by mouth every 7 (seven) days. 09/21/17   Anders Simmonds, PA-C    Family History Family History  Problem Relation Age of Onset   Hypertension Mother    Diabetes Mother  Heart disease Father    Heart disease Brother    Colon cancer Brother    Esophageal cancer Neg Hx    Rectal cancer Neg Hx    Stomach cancer Neg Hx     Social History Social History   Tobacco Use   Smoking status: Every Day    Current packs/day: 0.25    Average packs/day: 0.3 packs/day for 30.0 years (7.5 ttl pk-yrs)    Types: Cigarettes   Smokeless tobacco: Never  Vaping Use   Vaping status: Never Used  Substance Use Topics   Alcohol use: Yes   Drug use: No     Allergies   No known allergies   Review of Systems Review of Systems Per HPI  Physical Exam Triage Vital Signs ED Triage Vitals [02/07/23 1159]  Encounter Vitals Group     BP 136/87     Systolic BP Percentile      Diastolic BP Percentile      Pulse Rate (!) 52     Resp 16     Temp (!) 97.4 F (36.3 C)     Temp Source Oral     SpO2 96 %     Weight      Height      Head Circumference      Peak Flow      Pain Score 10     Pain Loc      Pain Education      Exclude from Growth Chart    No data found.  Updated Vital Signs BP 136/87 (BP Location: Left Arm)   Pulse (!) 52   Temp (!) 97.4 F (36.3 C) (Oral)   Resp 16   LMP 05/08/2013   SpO2 96%   Visual Acuity Right Eye Distance:   Left Eye Distance:   Bilateral Distance:    Right Eye Near:   Left Eye Near:    Bilateral Near:     Physical Exam Vitals and nursing note reviewed.  Constitutional:      Appearance: She is not ill-appearing or toxic-appearing.  HENT:     Head: Normocephalic and atraumatic.     Right Ear: Hearing and external ear normal.     Left Ear:  Hearing and external ear normal.     Nose: Nose normal.     Mouth/Throat:     Lips: Pink.     Mouth: Mucous membranes are moist. No injury.     Dentition: Normal dentition. No dental tenderness or dental caries.     Tongue: Lesions (patchy white areas to the tongue) present. Tongue does not deviate from midline.     Palate: No mass and lesions.     Pharynx: Oropharynx is clear. Uvula midline. No pharyngeal swelling, oropharyngeal exudate, posterior oropharyngeal erythema or uvula swelling.     Tonsils: No tonsillar exudate or tonsillar abscesses.  Eyes:     General: Lids are normal. Vision grossly intact. Gaze aligned appropriately.     Extraocular Movements: Extraocular movements intact.     Conjunctiva/sclera: Conjunctivae normal.  Pulmonary:     Effort: Pulmonary effort is normal.  Musculoskeletal:     Cervical back: Neck supple.  Skin:    General: Skin is warm and dry.     Capillary Refill: Capillary refill takes less than 2 seconds.     Findings: No rash.  Neurological:     General: No focal deficit present.     Mental Status: She is alert and oriented to person, place, and  time. Mental status is at baseline.     Cranial Nerves: No dysarthria or facial asymmetry.  Psychiatric:        Mood and Affect: Mood normal.        Speech: Speech normal.        Behavior: Behavior normal.        Thought Content: Thought content normal.        Judgment: Judgment normal.      UC Treatments / Results  Labs (all labs ordered are listed, but only abnormal results are displayed) Labs Reviewed - No data to display  EKG   Radiology No results found.  Procedures Procedures (including critical care time)  Medications Ordered in UC Medications - No data to display  Initial Impression / Assessment and Plan / UC Course  I have reviewed the triage vital signs and the nursing notes.  Pertinent labs & imaging results that were available during my care of the patient were reviewed by  me and considered in my medical decision making (see chart for details).   1. Oral candidiasis, type 2 diabetes with oral complication Presentation consistent with oral candidiasis. Will treat with nystatin every 6 hours for the next 7 days. Prescription sent to pharmacy. Advised follow-up with primary care in the next 2-3 days if lack of improvement.  Counseled patient on potential for adverse effects with medications prescribed/recommended today, strict ER and return-to-clinic precautions discussed, patient verbalized understanding.    Final Clinical Impressions(s) / UC Diagnoses   Final diagnoses:  Oral candidiasis     Discharge Instructions      Nystatin every 6 hours for the next 7 days, swish and swallow.   Follow-up with primary care in [place].   If you develop any new or worsening symptoms or if your symptoms do not start to improve, please return here or follow-up with your primary care provider. If your symptoms are severe, please go to the emergency room.    ED Prescriptions     Medication Sig Dispense Auth. Provider   nystatin (MYCOSTATIN) 100000 UNIT/ML suspension Take 5 mLs (500,000 Units total) by mouth 4 (four) times daily for 6 days. 120 mL Carlisle Beers, FNP      PDMP not reviewed this encounter.   Carlisle Beers, [place] 02/07/23 1235

## 2023-02-07 NOTE — Discharge Instructions (Signed)
Nystatin every 6 hours for the next 7 days, swish and swallow.   Follow-up with primary care in [place].   If you develop any new or worsening symptoms or if your symptoms do not start to improve, please return here or follow-up with your primary care provider. If your symptoms are severe, please go to the emergency room.

## 2023-02-07 NOTE — ED Notes (Signed)
Pt not answering form lobby

## 2023-02-07 NOTE — ED Notes (Signed)
Called to come back to room no answer.
